# Patient Record
Sex: Female | Born: 1950 | Race: White | Hispanic: No | Marital: Married | Smoking: Never smoker
Health system: Southern US, Community
[De-identification: ages and names within clinical notes are randomized; demographics above are authoritative.]

## PROBLEM LIST (undated history)

## (undated) DIAGNOSIS — N393 Stress incontinence (female) (male): Secondary | ICD-10-CM

## (undated) DIAGNOSIS — Z806 Family history of leukemia: Secondary | ICD-10-CM

## (undated) DIAGNOSIS — I700 Atherosclerosis of aorta: Secondary | ICD-10-CM

## (undated) DIAGNOSIS — F439 Reaction to severe stress, unspecified: Secondary | ICD-10-CM

## (undated) DIAGNOSIS — K2270 Barrett's esophagus without dysplasia: Secondary | ICD-10-CM

## (undated) DIAGNOSIS — M1990 Unspecified osteoarthritis, unspecified site: Secondary | ICD-10-CM

## (undated) DIAGNOSIS — J309 Allergic rhinitis, unspecified: Secondary | ICD-10-CM

## (undated) DIAGNOSIS — Z803 Family history of malignant neoplasm of breast: Secondary | ICD-10-CM

## (undated) DIAGNOSIS — C801 Malignant (primary) neoplasm, unspecified: Secondary | ICD-10-CM

## (undated) DIAGNOSIS — M8580 Other specified disorders of bone density and structure, unspecified site: Secondary | ICD-10-CM

## (undated) DIAGNOSIS — Z860100 Personal history of colon polyps, unspecified: Secondary | ICD-10-CM

## (undated) DIAGNOSIS — Z8041 Family history of malignant neoplasm of ovary: Secondary | ICD-10-CM

## (undated) DIAGNOSIS — Z87898 Personal history of other specified conditions: Secondary | ICD-10-CM

## (undated) DIAGNOSIS — K219 Gastro-esophageal reflux disease without esophagitis: Secondary | ICD-10-CM

## (undated) DIAGNOSIS — E669 Obesity, unspecified: Secondary | ICD-10-CM

## (undated) DIAGNOSIS — N816 Rectocele: Secondary | ICD-10-CM

## (undated) DIAGNOSIS — Z8042 Family history of malignant neoplasm of prostate: Secondary | ICD-10-CM

## (undated) DIAGNOSIS — L900 Lichen sclerosus et atrophicus: Secondary | ICD-10-CM

## (undated) DIAGNOSIS — E559 Vitamin D deficiency, unspecified: Secondary | ICD-10-CM

## (undated) DIAGNOSIS — E785 Hyperlipidemia, unspecified: Secondary | ICD-10-CM

## (undated) DIAGNOSIS — N8110 Cystocele, unspecified: Secondary | ICD-10-CM

## (undated) DIAGNOSIS — Z801 Family history of malignant neoplasm of trachea, bronchus and lung: Secondary | ICD-10-CM

## (undated) DIAGNOSIS — M21619 Bunion of unspecified foot: Secondary | ICD-10-CM

## (undated) HISTORY — DX: Family history of malignant neoplasm of breast: Z80.3

## (undated) HISTORY — DX: Personal history of other specified conditions: Z87.898

## (undated) HISTORY — DX: Reaction to severe stress, unspecified: F43.9

## (undated) HISTORY — DX: Cystocele, unspecified: N81.10

## (undated) HISTORY — DX: Gastro-esophageal reflux disease without esophagitis: K21.9

## (undated) HISTORY — DX: Family history of malignant neoplasm of trachea, bronchus and lung: Z80.1

## (undated) HISTORY — DX: Hyperlipidemia, unspecified: E78.5

## (undated) HISTORY — DX: Personal history of colonic polyps: Z86.010

## (undated) HISTORY — DX: Bunion of unspecified foot: M21.619

## (undated) HISTORY — DX: Rectocele: N81.6

## (undated) HISTORY — DX: Allergic rhinitis, unspecified: J30.9

## (undated) HISTORY — PX: LUMBAR DISC SURGERY: SHX700

## (undated) HISTORY — DX: Other specified disorders of bone density and structure, unspecified site: M85.80

## (undated) HISTORY — DX: Family history of malignant neoplasm of ovary: Z80.41

## (undated) HISTORY — DX: Lichen sclerosus et atrophicus: L90.0

## (undated) HISTORY — DX: Vitamin D deficiency, unspecified: E55.9

## (undated) HISTORY — PX: TUBAL LIGATION: SHX77

## (undated) HISTORY — DX: Stress incontinence (female) (male): N39.3

## (undated) HISTORY — DX: Barrett's esophagus without dysplasia: K22.70

## (undated) HISTORY — DX: Family history of malignant neoplasm of prostate: Z80.42

## (undated) HISTORY — DX: Atherosclerosis of aorta: I70.0

## (undated) HISTORY — DX: Personal history of colon polyps, unspecified: Z86.0100

## (undated) HISTORY — DX: Obesity, unspecified: E66.9

## (undated) HISTORY — PX: BREAST LUMPECTOMY: SHX2

## (undated) HISTORY — DX: Family history of leukemia: Z80.6

---

## 1998-08-30 ENCOUNTER — Emergency Department (HOSPITAL_COMMUNITY): Admission: EM | Admit: 1998-08-30 | Discharge: 1998-08-30 | Payer: Self-pay | Admitting: Emergency Medicine

## 1998-08-31 ENCOUNTER — Encounter: Payer: Self-pay | Admitting: Emergency Medicine

## 2000-09-23 ENCOUNTER — Other Ambulatory Visit: Admission: RE | Admit: 2000-09-23 | Discharge: 2000-09-23 | Payer: Self-pay | Admitting: Obstetrics and Gynecology

## 2000-12-24 ENCOUNTER — Other Ambulatory Visit: Admission: RE | Admit: 2000-12-24 | Discharge: 2000-12-24 | Payer: Self-pay | Admitting: Obstetrics and Gynecology

## 2000-12-24 ENCOUNTER — Encounter (INDEPENDENT_AMBULATORY_CARE_PROVIDER_SITE_OTHER): Payer: Self-pay

## 2007-05-06 ENCOUNTER — Other Ambulatory Visit: Admission: RE | Admit: 2007-05-06 | Discharge: 2007-05-06 | Payer: Self-pay | Admitting: Family Medicine

## 2008-05-11 ENCOUNTER — Other Ambulatory Visit: Admission: RE | Admit: 2008-05-11 | Discharge: 2008-05-11 | Payer: Self-pay | Admitting: Family Medicine

## 2009-06-27 ENCOUNTER — Other Ambulatory Visit: Admission: RE | Admit: 2009-06-27 | Discharge: 2009-06-27 | Payer: Self-pay | Admitting: Family Medicine

## 2010-07-10 ENCOUNTER — Other Ambulatory Visit: Admission: RE | Admit: 2010-07-10 | Discharge: 2010-07-10 | Payer: Self-pay | Admitting: Family Medicine

## 2010-10-29 DIAGNOSIS — Z87898 Personal history of other specified conditions: Secondary | ICD-10-CM

## 2010-10-29 HISTORY — DX: Personal history of other specified conditions: Z87.898

## 2011-07-06 ENCOUNTER — Other Ambulatory Visit: Payer: Self-pay | Admitting: Family Medicine

## 2011-07-06 ENCOUNTER — Ambulatory Visit
Admission: RE | Admit: 2011-07-06 | Discharge: 2011-07-06 | Disposition: A | Discharge status: Home / Self Care | Payer: BC Managed Care – PPO | Source: Ambulatory Visit | Attending: Family Medicine | Admitting: Family Medicine

## 2011-07-06 DIAGNOSIS — M549 Dorsalgia, unspecified: Secondary | ICD-10-CM

## 2011-07-30 ENCOUNTER — Emergency Department (HOSPITAL_COMMUNITY)
Admission: EM | Admit: 2011-07-30 | Discharge: 2011-07-30 | Disposition: A | Discharge status: Home / Self Care | Payer: BC Managed Care – PPO | Attending: Emergency Medicine | Admitting: Emergency Medicine

## 2011-07-30 DIAGNOSIS — K219 Gastro-esophageal reflux disease without esophagitis: Secondary | ICD-10-CM | POA: Insufficient documentation

## 2011-07-30 DIAGNOSIS — R42 Dizziness and giddiness: Secondary | ICD-10-CM | POA: Insufficient documentation

## 2011-07-30 DIAGNOSIS — Z79899 Other long term (current) drug therapy: Secondary | ICD-10-CM | POA: Insufficient documentation

## 2011-07-30 DIAGNOSIS — R040 Epistaxis: Secondary | ICD-10-CM | POA: Insufficient documentation

## 2011-07-30 DIAGNOSIS — D649 Anemia, unspecified: Secondary | ICD-10-CM | POA: Insufficient documentation

## 2011-07-30 DIAGNOSIS — R110 Nausea: Secondary | ICD-10-CM | POA: Insufficient documentation

## 2011-07-30 LAB — CBC
HCT: 32.5 % — ABNORMAL LOW (ref 36.0–46.0)
Hemoglobin: 11.3 g/dL — ABNORMAL LOW (ref 12.0–15.0)
MCH: 30.9 pg (ref 26.0–34.0)
MCHC: 34.8 g/dL (ref 30.0–36.0)
MCV: 88.8 fL (ref 78.0–100.0)
Platelets: 339 10*3/uL (ref 150–400)
RBC: 3.66 MIL/uL — ABNORMAL LOW (ref 3.87–5.11)
RDW: 13 % (ref 11.5–15.5)
WBC: 7.3 10*3/uL (ref 4.0–10.5)

## 2013-01-27 HISTORY — PX: COLONOSCOPY: SHX174

## 2013-10-12 ENCOUNTER — Encounter (HOSPITAL_COMMUNITY): Payer: Self-pay | Admitting: Emergency Medicine

## 2013-10-12 ENCOUNTER — Emergency Department (HOSPITAL_COMMUNITY)
Admission: EM | Admit: 2013-10-12 | Discharge: 2013-10-12 | Disposition: A | Discharge status: Home / Self Care | Payer: BC Managed Care – PPO | Source: Home / Self Care | Attending: Family Medicine | Admitting: Family Medicine

## 2013-10-12 DIAGNOSIS — J069 Acute upper respiratory infection, unspecified: Secondary | ICD-10-CM

## 2013-10-12 DIAGNOSIS — R0982 Postnasal drip: Secondary | ICD-10-CM

## 2013-10-12 MED ORDER — PROMETHAZINE-CODEINE 6.25-10 MG/5ML PO SYRP: 5.0000 mL | ORAL_SOLUTION | Freq: Four times a day (QID) | ORAL | Status: DC | PRN

## 2013-10-12 MED ORDER — IPRATROPIUM BROMIDE 0.06 % NA SOLN: 2.0000 | Freq: Four times a day (QID) | NASAL | Status: DC

## 2013-10-12 MED ORDER — BENZONATATE 100 MG PO CAPS: 100.0000 mg | ORAL_CAPSULE | Freq: Three times a day (TID) | ORAL | Status: DC

## 2013-10-12 MED ORDER — PREDNISONE 10 MG PO TABS: 20.0000 mg | ORAL_TABLET | Freq: Every day | ORAL | Status: DC

## 2013-10-12 NOTE — ED Provider Notes (Signed)
Nichole Ritter is a 62 y.o. female who presents to Urgent Care today for sinus congestion cough and runny nose. This is been present for about one week. She was seen in urgent care [place] given third-generation cephalosporin and noncoated containing cough medication. She is now much better. She denies any shortness of breath nausea vomiting or diarrhea. She feels well otherwise. 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    History reviewed. No pertinent past medical history. History  Substance Use Topics  . Smoking status: Never Smoker   . Smokeless tobacco: Not on file  . Alcohol Use: No   ROS as above Medications reviewed. No current facility-administered medications for this encounter.   Current Outpatient Prescriptions  Medication Sig Dispense Refill  . pantoprazole (PROTONIX) 40 MG tablet Take 40 mg by mouth 2 (two) times daily.      . benzonatate (TESSALON) 100 MG capsule Take 1 capsule (100 mg total) by mouth every 8 (eight) hours.  21 capsule  0  . ipratropium (ATROVENT) 0.06 % nasal spray Place 2 sprays into both nostrils 4 (four) times daily.  15 mL  1  . predniSONE (DELTASONE) 10 MG tablet Take 2 tablets (20 mg total) by mouth daily.  10 tablet  0  . promethazine-codeine (PHENERGAN WITH CODEINE) 6.25-10 MG/5ML syrup Take 5 mLs by mouth every 6 (six) hours as needed for cough.  120 mL  0    Exam:  BP 126/72  Pulse 75  Temp(Src) 97.8 F (36.6 C) (Oral)  Resp 16  SpO2 100% Gen: Well NAD HEENT: EOMI,  MMM, posterior pharynx with cobblestoning. Tympanic membranes are normal appearing bilaterally Lungs: Normal work of breathing. CTABL Heart: RRR no MRG Abd: NABS, Soft. NT, ND Exts: Non edematous BL  LE, warm and well perfused.   Assessment and Plan: 62 y.o. female with brow URI with cough. Plan for symptomatic management Atrovent nasal spray, codeine containing cough medication, Tessalon Perles, and low-dose short duration prednisone. Call or go to the emergency  room if you get worse, have trouble breathing, have chest pains, or palpitations.       Rodolph Bong, MD 10/12/13 [phone]

## 2013-10-12 NOTE — ED Notes (Signed)
Facial pain and cough; recent treatment for bronchitis (never completely resolved)

## 2013-11-10 ENCOUNTER — Other Ambulatory Visit: Payer: Self-pay | Admitting: Family Medicine

## 2013-11-10 ENCOUNTER — Other Ambulatory Visit (HOSPITAL_COMMUNITY)
Admission: RE | Admit: 2013-11-10 | Discharge: 2013-11-10 | Disposition: A | Discharge status: Home / Self Care | Payer: BC Managed Care – PPO | Source: Ambulatory Visit | Attending: Family Medicine | Admitting: Family Medicine

## 2013-11-10 DIAGNOSIS — Z0000 Encounter for general adult medical examination without abnormal findings: Secondary | ICD-10-CM | POA: Insufficient documentation

## 2014-10-05 ENCOUNTER — Other Ambulatory Visit: Payer: Self-pay | Admitting: Family Medicine

## 2014-10-05 ENCOUNTER — Ambulatory Visit
Admission: RE | Admit: 2014-10-05 | Discharge: 2014-10-05 | Disposition: A | Discharge status: Home / Self Care | Payer: BC Managed Care – PPO | Source: Ambulatory Visit | Attending: Family Medicine | Admitting: Family Medicine

## 2014-10-05 DIAGNOSIS — M546 Pain in thoracic spine: Secondary | ICD-10-CM

## 2014-12-07 ENCOUNTER — Other Ambulatory Visit: Payer: Self-pay | Admitting: Gastroenterology

## 2017-06-04 ENCOUNTER — Other Ambulatory Visit: Payer: Self-pay | Admitting: Family Medicine

## 2017-06-04 DIAGNOSIS — R221 Localized swelling, mass and lump, neck: Secondary | ICD-10-CM

## 2017-06-10 ENCOUNTER — Ambulatory Visit
Admission: RE | Admit: 2017-06-10 | Discharge: 2017-06-10 | Disposition: A | Discharge status: Home / Self Care | Payer: Medicare Other | Source: Ambulatory Visit | Attending: Family Medicine | Admitting: Family Medicine

## 2017-06-10 DIAGNOSIS — R221 Localized swelling, mass and lump, neck: Secondary | ICD-10-CM

## 2017-06-10 MED ORDER — IOPAMIDOL (ISOVUE-300) INJECTION 61%
75.0000 mL | Freq: Once | INTRAVENOUS | Status: AC | PRN
  Administered 2017-06-10: 75 mL via INTRAVENOUS

## 2017-08-19 ENCOUNTER — Other Ambulatory Visit (HOSPITAL_COMMUNITY)
Admission: RE | Admit: 2017-08-19 | Discharge: 2017-08-19 | Disposition: A | Discharge status: Home / Self Care | Payer: Medicare Other | Source: Ambulatory Visit | Attending: Family Medicine | Admitting: Family Medicine

## 2017-08-19 ENCOUNTER — Other Ambulatory Visit: Payer: Self-pay | Admitting: Family Medicine

## 2017-08-19 DIAGNOSIS — Z124 Encounter for screening for malignant neoplasm of cervix: Secondary | ICD-10-CM | POA: Diagnosis present

## 2017-08-20 LAB — CYTOLOGY - PAP: DIAGNOSIS: NEGATIVE

## 2018-02-26 HISTORY — PX: CATARACT EXTRACTION: SUR2

## 2018-03-23 ENCOUNTER — Encounter (HOSPITAL_COMMUNITY): Payer: Self-pay | Admitting: Emergency Medicine

## 2018-03-23 ENCOUNTER — Other Ambulatory Visit: Payer: Self-pay

## 2018-03-23 ENCOUNTER — Ambulatory Visit (HOSPITAL_COMMUNITY)
Admission: EM | Admit: 2018-03-23 | Discharge: 2018-03-23 | Disposition: A | Discharge status: Home / Self Care | Payer: Medicare Other

## 2018-03-23 DIAGNOSIS — M545 Low back pain, unspecified: Secondary | ICD-10-CM

## 2018-03-23 DIAGNOSIS — G8929 Other chronic pain: Secondary | ICD-10-CM

## 2018-03-23 LAB — POCT URINALYSIS DIP (DEVICE)
BILIRUBIN URINE: NEGATIVE
Glucose, UA: NEGATIVE mg/dL
HGB URINE DIPSTICK: NEGATIVE
Ketones, ur: NEGATIVE mg/dL
Leukocytes, UA: NEGATIVE
Nitrite: NEGATIVE
Protein, ur: NEGATIVE mg/dL
Urobilinogen, UA: 0.2 mg/dL (ref 0.0–1.0)
pH: 6 (ref 5.0–8.0)

## 2018-03-23 MED ORDER — CYCLOBENZAPRINE HCL 10 MG PO TABS: 5.0000 mg | ORAL_TABLET | Freq: Three times a day (TID) | ORAL | 0 refills | Status: DC | PRN

## 2018-03-23 MED ORDER — TRAMADOL HCL 50 MG PO TABS: 50.0000 mg | ORAL_TABLET | Freq: Four times a day (QID) | ORAL | 0 refills | Status: DC | PRN

## 2018-03-23 NOTE — Discharge Instructions (Signed)
Urine is perfect today.  This is back pain and only back pain causing your symptoms.

## 2018-03-23 NOTE — ED Provider Notes (Signed)
03/23/2018 2:18 PM   DOB: 06-Apr-1951 / MRN: [id number]  SUBJECTIVE:  Nichole Ritter is a 67 y.o. female presenting for right-sided low back pain that started about 4 days ago and worsening.  Patient cannot take NSAIDs secondary to "a bad esophagus."  She denies weakness and numbness in the legs.  She had back surgery in the past.  She denies rash.  She worries about the possibility of a UTI because she has been peeing more frequently.  She is allergic to caine-1 [lidocaine].   She  has no past medical history on file.    She  reports that she has never smoked. She does not have any smokeless tobacco history on file. She reports that she does not drink alcohol or use drugs. She  has no sexual activity history on file. The patient  has a past surgical history that includes Lumbar disc surgery; Breast lumpectomy; and Tubal ligation.  Her family history includes Cancer in her mother; Diabetes in her mother; Hypertension in her mother; Stroke in her mother.  Review of Systems  Genitourinary: Positive for frequency. Negative for dysuria, flank pain, hematuria and urgency.  Musculoskeletal: Positive for back pain and myalgias. Negative for falls, joint pain and neck pain.  Neurological: Negative for weakness and headaches.    OBJECTIVE:  BP (!) 155/78 (BP Location: Left Arm) Comment: reported BP to Nurse Kenton Kingfisher  Pulse 60   Temp 98.2 F (36.8 C) (Oral)   Resp 16   SpO2 97%   Wt Readings from Last 3 Encounters:  No data found for Wt   Temp Readings from Last 3 Encounters:  03/23/18 98.2 F (36.8 C) (Oral)  10/12/13 97.8 F (36.6 C) (Oral)   BP Readings from Last 3 Encounters:  03/23/18 (!) 155/78  10/12/13 126/72   Pulse Readings from Last 3 Encounters:  03/23/18 60  10/12/13 75     Physical Exam  Constitutional: She is oriented to person, place, and time. She appears well-nourished. No distress.  Eyes: Pupils are equal, round, and reactive to light. EOM are normal.   Cardiovascular: Normal rate, regular rhythm, S1 normal, S2 normal, normal heart sounds and intact distal pulses. Exam reveals no gallop, no friction rub and no decreased pulses.  No murmur heard. Pulmonary/Chest: Effort normal. No stridor. No respiratory distress. She has no wheezes. She has no rales.  Abdominal: She exhibits no distension.  Musculoskeletal: She exhibits no edema.  Neurological: She is alert and oriented to person, place, and time. No cranial nerve deficit. Gait normal.  Skin: Skin is dry. She is not diaphoretic.  Psychiatric: She has a normal mood and affect.  Vitals reviewed.   Results for orders placed or performed during the hospital encounter of 03/23/18 (from the past 72 hour(s))  POCT urinalysis dip (device)     Status: None   Collection Time: 03/23/18  2:08 PM  Result Value Ref Range   Glucose, UA NEGATIVE NEGATIVE mg/dL   Bilirubin Urine NEGATIVE NEGATIVE   Ketones, ur NEGATIVE NEGATIVE mg/dL   Specific Gravity, Urine <=1.005 1.005 - 1.030   Hgb urine dipstick NEGATIVE NEGATIVE   pH 6.0 5.0 - 8.0   Protein, ur NEGATIVE NEGATIVE mg/dL   Urobilinogen, UA 0.2 0.0 - 1.0 mg/dL   Nitrite NEGATIVE NEGATIVE   Leukocytes, UA NEGATIVE NEGATIVE    Comment: Biochemical Testing Only. Please order routine urinalysis from main lab if confirmatory testing is needed.    No results found.  ASSESSMENT AND PLAN:  Acute left-sided low back pain without sciatica: She has a history of esophagitis.  Starting tramadol along with Flexeril.  Advised that she follow-up with her primary care provider as this is certainly acute on chronic.  Chronic low back pain, unspecified back pain laterality, with sciatica presence unspecified    Discharge Instructions     Urine is perfect today.  This is back pain and only back pain causing your symptoms.        The patient is advised to call or return to clinic if she does not see an improvement in symptoms, or to seek the care  of the closest emergency department if she worsens with the above plan.   Philis Fendt, MHS, PA-C 03/23/2018 2:18 PM   Tereasa Coop, PA-C 03/23/18 1419

## 2018-03-23 NOTE — ED Triage Notes (Signed)
History of back surgery.  Had a back injury following this surgery.  This past week back pain has been significantly bad.  Patient is having cataract surgery this week.

## 2018-08-21 ENCOUNTER — Telehealth: Payer: Self-pay

## 2018-08-21 NOTE — Telephone Encounter (Signed)
SENT REFERRAL TO SCHEDULING AND FILED NOTES 

## 2018-09-11 ENCOUNTER — Encounter: Payer: Self-pay | Admitting: Cardiology

## 2018-10-01 ENCOUNTER — Encounter: Payer: Self-pay | Admitting: *Deleted

## 2018-10-01 ENCOUNTER — Ambulatory Visit: Payer: Medicare Other | Admitting: Cardiology

## 2018-10-01 ENCOUNTER — Encounter (INDEPENDENT_AMBULATORY_CARE_PROVIDER_SITE_OTHER): Payer: Self-pay

## 2018-10-01 ENCOUNTER — Encounter: Payer: Self-pay | Admitting: Cardiology

## 2018-10-01 VITALS — BP 140/80 | HR 65 | Ht 63.5 in | Wt 181.8 lb

## 2018-10-01 DIAGNOSIS — R0789 Other chest pain: Secondary | ICD-10-CM | POA: Diagnosis not present

## 2018-10-01 DIAGNOSIS — Z8249 Family history of ischemic heart disease and other diseases of the circulatory system: Secondary | ICD-10-CM | POA: Diagnosis not present

## 2018-10-01 DIAGNOSIS — I700 Atherosclerosis of aorta: Secondary | ICD-10-CM | POA: Diagnosis not present

## 2018-10-01 DIAGNOSIS — Z79899 Other long term (current) drug therapy: Secondary | ICD-10-CM

## 2018-10-01 MED ORDER — ROSUVASTATIN CALCIUM 5 MG PO TABS: 5.0000 mg | ORAL_TABLET | Freq: Every day | ORAL | 3 refills | Status: DC

## 2018-10-01 NOTE — Patient Instructions (Signed)
Medication Instructions:  Please start Crestor 5 mg daily. Continue all other medications as listed.  If you need a refill on your cardiac medications before your next appointment, please call your pharmacy.   Lab work: Please have fasting lab work in 2 months after starting Crestor (Lipid/ALT) If you have labs (blood work) drawn today and your tests are completely normal, you will receive your results only by: Marland Kitchen MyChart Message (if you have MyChart) OR . A paper copy in the mail If you have any lab test that is abnormal or we need to change your treatment, we will call you to review the results.  Testing/Procedures: Your physician has requested that you have a myoview. For further information please visit [email or web address]. Please follow instruction sheet, as given.  Follow-Up: At [facility], you and your health needs are our priority.  As part of our continuing mission to provide you with exceptional heart care, we have created designated Provider Care Teams.  These Care Teams include your primary Cardiologist (physician) and Advanced Practice Providers (APPs -  Physician Assistants and Nurse Practitioners) who all work together to provide you with the care you need, when you need it. You will need a follow up appointment in 3 months.  Please call our office 2 months in advance to schedule this appointment.  You may see Candee Furbish, MD or one of the following Advanced Practice Providers on your designated Care Team:   Truitt Merle, NP Cecilie Kicks, NP . Kathyrn Drown, NP  Thank you for choosing [facility]!!

## 2018-10-01 NOTE — Progress Notes (Signed)
Cardiology Office Note:    Date:  10/01/2018   ID:  Nichole Ritter, DOB Aug 01, 1951, MRN [id number]  PCP:  [name], MD  Cardiologist:  Candee Furbish, MD  Electrophysiologist:  None   Referring MD: [name], MD     History of Present Illness:    Nichole Ritter is a 67 y.o. female here for evaluation of chest pain at the request of Dr. [name].  In review of her clinic note stress has been quite high over the past few years.  Her 62 year old niece lives with her for the past year and a half.  She has been arrested, psychiatric unit, schizophrenia.  She is worried about her heart.  2 of her brothers had heart disease in their 9s. One brother in perfect health died MI, other CAD with stents.  She has been having chest discomfort that she thought was reflux.  Sometimes burning in her chest goes up her neck.  Sometimes she has associated nausea.  This can be randomly and not always associated with exertion.  For instance, she is able to walk 15 minutes without discomfort.  No unreasonable shortness of breath or diaphoresis. Feels heavy at night, palps.   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  She is heard several terrible things about them.  Her brother takes atorvastatin and is doing okay with this.  She refuses to take that medication.  No DM. No tob.   No syncope.   She has been diagnosed in the past with esophagitis duodenitis from an EGD in 2009 from Dr. Wynell Balloon from at [facility].  Dr. Paulita Fujita sees her here.  She has had epistaxis 2012.  She takes PPI.  Past Medical History:  Diagnosis Date  . Allergic rhinitis   . Aortic atherosclerosis ([place])   . Barrett esophagus   . Bunion   . Female cystocele   . GERD (gastroesophageal reflux disease)   . History of epistaxis 2012  . Hyperlipidemia    UNSPECIFIED  . Lichen sclerosus   . Obesity   . Osteopenia   . Personal history of colonic polyps   . Rectocele   . Situational  stress   . Stress incontinence   . Vitamin D deficiency     Past Surgical History:  Procedure Laterality Date  . BREAST LUMPECTOMY    . CATARACT EXTRACTION Bilateral 02/2018   03/2018  . COLONOSCOPY  01/2013  . LUMBAR DISC SURGERY    . TUBAL LIGATION      Current Medications: No outpatient medications have been marked as taking for the 10/01/18 encounter (Office Visit) with Jerline Pain, MD.     Allergies:   Caine-1 [lidocaine]   Social History   Socioeconomic History  . Marital status: Married    Spouse name: Not on file  . Number of children: Not on file  . Years of education: Not on file  . Highest education level: Not on file  Occupational History  . Not on file  Social Needs  . Financial resource strain: Not on file  . Food insecurity:    Worry: Not on file    Inability: Not on file  . Transportation needs:    Medical: Not on file    Non-medical: Not on file  Tobacco Use  . Smoking status: Never Smoker  . Smokeless tobacco: Never Used  Substance and Sexual Activity  . Alcohol use: No  . Drug use: Never  .  Sexual activity: Not on file  Lifestyle  . Physical activity:    Days per week: Not on file    Minutes per session: Not on file  . Stress: Not on file  Relationships  . Social connections:    Talks on phone: Not on file    Gets together: Not on file    Attends religious service: Not on file    Active member of club or organization: Not on file    Attends meetings of clubs or organizations: Not on file    Relationship status: Not on file  Other Topics Concern  . Not on file  Social History Narrative  . Not on file     Family History: The patient's family history includes Cancer in her mother; Diabetes in her mother; Hypertension in her mother; Stroke in her mother.  ROS:   Please see the history of present illness.    No fevers chills nausea vomiting syncope.  All other systems reviewed and are negative.  EKGs/Labs/Other Studies Reviewed:      The following studies were reviewed today: Prior office notes, lab work, EKG  EKG:  EKG from 08/20/2018 performed by Dr. [name] shows heart rate of 55 bpm with poor R wave progression otherwise unremarkable.  Personally reviewed and interpreted  Recent Labs: No results found for requested labs within last 8760 hours.  Recent Lipid Panel No results found for: CHOL, TRIG, HDL, CHOLHDL, VLDL, LDLCALC, LDLDIRECT   Total cholesterol 235 triglycerides 101 HDL 55 LDL 159 hemoglobin 15.1 320 creatinine 0.72, ALT 22, TSH 2.89  Physical Exam:    VS:  BP 140/80   Pulse 65   Ht 5' 3.5" (1.613 m)   Wt 181 lb 12.8 oz (82.5 kg)   SpO2 95%   BMI 31.70 kg/m     Wt Readings from Last 3 Encounters:  10/01/18 181 lb 12.8 oz (82.5 kg)     GEN:  Well nourished, well developed in no acute distress HEENT: Normal NECK: No JVD; No carotid bruits LYMPHATICS: No lymphadenopathy CARDIAC: RRR, no murmurs, rubs, gallops RESPIRATORY:  Clear to auscultation without rales, wheezing or rhonchi  ABDOMEN: Soft, non-tender, non-distended MUSCULOSKELETAL:  No edema; No deformity  SKIN: Warm and dry NEUROLOGIC:  Alert and oriented x 3 PSYCHIATRIC:  Normal affect   ASSESSMENT:    1. Chest tightness   2. Family history of arteriosclerotic cardiovascular disease   3. Encounter for long-term current use of medication   4. Aortic atherosclerosis ([place])    PLAN:    In order of problems listed above:  Atypical chest pain - We will go ahead and check a nuclear stress test to ensure that she does not have any high risk ischemia.  Certainly her symptoms could be GI related or GERD but with her brothers history of coronary artery disease and 1 of sudden death this makes sense to pursue.  Aortic atherosclerosis - I personally reviewed and showed her the pictures from her CT scan of her neck.  The samples of her aortic arch demonstrate fairly significant aortic calcification/atherosclerosis.  Given these  findings along with her elevated LDL of 159, I strongly recommend that she takes statin medication.  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  She  is willing to try a low-dose of medicine other than atorvastatin.  I did relay to her that if her brother is tolerating atorvastatin well, sometimes genetically she would be able to tolerate it well as well.  She does not wish to use this medicine.  She was however willing to try low-dose Crestor 5 mg once a day.  She says "I will give it a try for 2 to 3 months ".  I expressed to her that with her elevated cholesterol, family history, aortic atherosclerotic process that taking a statin will help her minimize her future risk of stroke and heart attack.  She understands this.  I also conveyed to her that we do have a lipid clinic here if she is having any difficulties with the statin medications and she may be a candidate for an alternative.  In 2 months recheck lipid panel and ALT.  We will see her back in 3 months to see how she is progressing. Of course, I will let her know the results of her stress test.   Medication Adjustments/Labs and Tests Ordered: Current medicines are reviewed at length with the patient today.  Concerns regarding medicines are outlined above.  Orders Placed This Encounter  Procedures  . ALT  . Lipid panel  . MYOCARDIAL PERFUSION IMAGING   Meds ordered this encounter  Medications  . rosuvastatin (CRESTOR) 5 MG tablet    Sig: Take 1 tablet (5 mg total) by mouth daily.    Dispense:  90 tablet    Refill:  3    Patient Instructions  Medication Instructions:  Please start Crestor 5 mg daily. Continue all other medications as listed.  If you need a refill on your cardiac medications before your next  appointment, please call your pharmacy.   Lab work: Please have fasting lab work in 2 months after starting Crestor (Lipid/ALT) If you have labs (blood work) drawn today and your tests are completely normal, you will receive your results only by: Marland Kitchen MyChart Message (if you have MyChart) OR . A paper copy in the mail If you have any lab test that is abnormal or we need to change your treatment, we will call you to review the results.  Testing/Procedures: Your physician has requested that you have a myoview. For further information please visit [email or web address]. Please follow instruction sheet, as given.  Follow-Up: At [facility] ([place] you and your health needs are our priority.  As part of our continuing mission to provide you with exceptional heart care, we have created designated Provider Care Teams.  These Care Teams include your primary Cardiologist (physician) and Advanced Practice Providers (APPs -  Physician Assistants and Nurse Practitioners) who all work together to provide you with the care you need, when you need it. You will need a follow up appointment in 3 months.  Please call our office 2 months in advance to schedule this appointment.  You may see Candee Furbish, MD or one of the following Advanced Practice Providers on your designated Care Team:   Truitt Merle, NP Cecilie Kicks, NP . Kathyrn Drown, NP  Thank you for choosing [facility]!!          Signed, Candee Furbish, MD  10/01/2018 11:25 AM    [place]

## 2018-10-02 ENCOUNTER — Telehealth (HOSPITAL_COMMUNITY): Payer: Self-pay

## 2018-10-02 ENCOUNTER — Telehealth (HOSPITAL_COMMUNITY): Payer: Self-pay | Admitting: *Deleted

## 2018-10-02 NOTE — Telephone Encounter (Signed)
Left message on voicemail in reference to upcoming appointment scheduled for 11/26/17. Phone number given for a call back so details instructions can be given.  Nichole Ritter Jacqueline   

## 2018-10-02 NOTE — Telephone Encounter (Signed)
Patient given detailed instructions per Myocardial Perfusion Study Information Sheet for the test on 10/06/18 at 0800. Patient notified to arrive 15 minutes early and that it is imperative to arrive on time for appointment to keep from having the test rescheduled.  If you need to cancel or reschedule your appointment, please call the office within 24 hours of your appointment. . Patient verbalized understanding. TMY

## 2018-10-06 ENCOUNTER — Ambulatory Visit (HOSPITAL_COMMUNITY): Payer: Medicare Other | Attending: Internal Medicine

## 2018-10-06 DIAGNOSIS — Z8249 Family history of ischemic heart disease and other diseases of the circulatory system: Secondary | ICD-10-CM | POA: Insufficient documentation

## 2018-10-06 DIAGNOSIS — R0789 Other chest pain: Secondary | ICD-10-CM | POA: Insufficient documentation

## 2018-10-06 LAB — MYOCARDIAL PERFUSION IMAGING
CHL CUP MPHR: 152 {beats}/min
CHL CUP NUCLEAR SDS: 0
Estimated workload: 7 METS
Exercise duration (min): 6 min
LV sys vol: 23 mL
LVDIAVOL: 62 mL (ref 46–106)
Peak HR: 136 {beats}/min
Percent HR: 89 %
RPE: 18
Rest HR: 48 {beats}/min
SRS: 0
SSS: 0
TID: 0.89

## 2018-10-06 MED ORDER — TECHNETIUM TC 99M TETROFOSMIN IV KIT
10.9000 | PACK | Freq: Once | INTRAVENOUS | Status: AC | PRN
  Administered 2018-10-06: 10.9 via INTRAVENOUS
  Filled 2018-10-06: qty 11

## 2018-10-06 MED ORDER — TECHNETIUM TC 99M TETROFOSMIN IV KIT
32.3000 | PACK | Freq: Once | INTRAVENOUS | Status: AC | PRN
  Administered 2018-10-06: 32.3 via INTRAVENOUS
  Filled 2018-10-06: qty 33

## 2018-10-06 NOTE — Progress Notes (Signed)
Pt was scheduled for 12/9 at 8 AM.

## 2018-12-02 ENCOUNTER — Other Ambulatory Visit: Payer: Medicare Other

## 2018-12-02 ENCOUNTER — Encounter (INDEPENDENT_AMBULATORY_CARE_PROVIDER_SITE_OTHER): Payer: Self-pay

## 2018-12-02 DIAGNOSIS — R0789 Other chest pain: Secondary | ICD-10-CM

## 2018-12-02 DIAGNOSIS — Z79899 Other long term (current) drug therapy: Secondary | ICD-10-CM

## 2018-12-02 LAB — LIPID PANEL
CHOLESTEROL TOTAL: 179 mg/dL (ref 100–199)
Chol/HDL Ratio: 3.9 ratio (ref 0.0–4.4)
HDL: 46 mg/dL (ref >39)
LDL CALC: 93 mg/dL (ref 0–99)
Triglycerides: 202 mg/dL — ABNORMAL HIGH (ref 0–149)
VLDL Cholesterol Cal: 40 mg/dL (ref 5–40)

## 2018-12-02 LAB — ALT: ALT: 22 IU/L (ref 0–32)

## 2019-01-06 ENCOUNTER — Encounter: Payer: Self-pay | Admitting: Cardiology

## 2019-01-06 ENCOUNTER — Encounter (INDEPENDENT_AMBULATORY_CARE_PROVIDER_SITE_OTHER): Payer: Self-pay

## 2019-01-06 ENCOUNTER — Ambulatory Visit: Payer: Medicare Other | Admitting: Cardiology

## 2019-01-06 VITALS — BP 144/100 | HR 65 | Ht 63.0 in | Wt 189.4 lb

## 2019-01-06 DIAGNOSIS — Z8249 Family history of ischemic heart disease and other diseases of the circulatory system: Secondary | ICD-10-CM | POA: Diagnosis not present

## 2019-01-06 DIAGNOSIS — R0789 Other chest pain: Secondary | ICD-10-CM

## 2019-01-06 DIAGNOSIS — E782 Mixed hyperlipidemia: Secondary | ICD-10-CM | POA: Diagnosis not present

## 2019-01-06 DIAGNOSIS — I700 Atherosclerosis of aorta: Secondary | ICD-10-CM | POA: Insufficient documentation

## 2019-01-06 NOTE — Progress Notes (Signed)
Cardiology Office Note:    Date:  01/06/2019   ID:  Nichole Ritter, DOB 02-11-1951, MRN [id number]  PCP:  [name], MD  Cardiologist:  Candee Furbish, MD  Electrophysiologist:  None   Referring MD: [name], MD     History of Present Illness:    Nichole Ritter is a 68 y.o. female here for follow-up of hyperlipidemia.  LDL 93, triglycerides 202, HDL 46, total cholesterol 179 on 12/02/2018 after starting Crestor 5 mg once a day.  Also taking fish oil.  Aortic atherosclerosis was previously seen on CT scan.  She is worried about her heart.  2 of her brothers had heart disease in their 38s. One brother in perfect health died MI, other CAD with stents.  She has been having chest discomfort that she thought was reflux.  Sometimes burning in her chest goes up her neck.  Sometimes she has associated nausea.  This can be randomly and not always associated with exertion.  For instance, she is able to walk 15 minutes without discomfort.  No unreasonable shortness of breath or diaphoresis. Feels heavy at night, palps.   A nuclear stress test was performed and was low risk with no ischemia.  Aortic atherosclerosis was noted on CT scan.  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  She is heard several terrible things about them.  Her brother takes atorvastatin and is doing okay with this.   No DM. No tob.   No syncope.   She has been diagnosed in the past with esophagitis duodenitis from an EGD in 2009 from Dr. Wynell Balloon from at [facility].  Dr. Paulita Fujita sees her here.  She has had epistaxis 2012.  She takes PPI. Past Medical History:  Diagnosis Date  . Allergic rhinitis   . Aortic atherosclerosis ([place])   . Barrett esophagus   . Bunion   . Female cystocele   . GERD (gastroesophageal reflux disease)   . History of epistaxis 2012  . Hyperlipidemia    UNSPECIFIED  . Lichen sclerosus   . Obesity   . Osteopenia   . Personal history of colonic  polyps   . Rectocele   . Situational stress   . Stress incontinence   . Vitamin D deficiency     Past Surgical History:  Procedure Laterality Date  . BREAST LUMPECTOMY    . CATARACT EXTRACTION Bilateral 02/2018   03/2018  . COLONOSCOPY  01/2013  . LUMBAR DISC SURGERY    . TUBAL LIGATION      Current Medications: Current Meds  Medication Sig  . B Complex-C (SUPER B COMPLEX PO) Take 1 tablet by mouth daily.   . betamethasone dipropionate (DIPROLENE) 0.05 % cream Apply 1 application topically 2 (two) times daily as needed.   . Calcium-Magnesium-Vitamin D (CALCIUM 1200+D3 PO) Take 1 tablet by mouth daily.   . Cholecalciferol (VITAMIN D3) 50 MCG (2000 UT) capsule Take 2,000 Units by mouth daily.  . cycloSPORINE (RESTASIS) 0.05 % ophthalmic emulsion 1 drop 2 (two) times daily.  . fluticasone (FLONASE) 50 MCG/ACT nasal spray Place into both nostrils daily.  . LUTEIN PO Take by mouth.  . Multiple Vitamins-Minerals (CENTRUM SILVER 50+WOMEN PO) Take 1 tablet by mouth daily.   . pantoprazole (PROTONIX) 40 MG tablet Take 40 mg by mouth daily.  . rosuvastatin (CRESTOR) 5 MG tablet Take 1 tablet (5 mg total) by mouth daily.     Allergies:   Caine-1 [lidocaine]; Esomeprazole magnesium;  Formaldehyde; and Nickel   Social History   Socioeconomic History  . Marital status: Married    Spouse name: Not on file  . Number of children: Not on file  . Years of education: Not on file  . Highest education level: Not on file  Occupational History  . Not on file  Social Needs  . Financial resource strain: Not on file  . Food insecurity:    Worry: Not on file    Inability: Not on file  . Transportation needs:    Medical: Not on file    Non-medical: Not on file  Tobacco Use  . Smoking status: Never Smoker  . Smokeless tobacco: Never Used  Substance and Sexual Activity  . Alcohol use: No  . Drug use: Never  . Sexual activity: Not on file  Lifestyle  . Physical activity:    Days per week:  Not on file    Minutes per session: Not on file  . Stress: Not on file  Relationships  . Social connections:    Talks on phone: Not on file    Gets together: Not on file    Attends religious service: Not on file    Active member of club or organization: Not on file    Attends meetings of clubs or organizations: Not on file    Relationship status: Not on file  Other Topics Concern  . Not on file  Social History Narrative  . Not on file     Family History: The patient's family history includes Cancer in her mother; Diabetes in her mother; Hypertension in her mother; Stroke in her mother.  ROS:   Please see the history of present illness.     All other systems reviewed and are negative.  EKGs/Labs/Other Studies Reviewed:    The following studies were reviewed today:  08/2018 - Total cholesterol 235 triglycerides 101 HDL 55 LDL 159 hemoglobin 15.1 320 creatinine 0.72, ALT 22, TSH 2.89  EKG:  08/20/2018 performed by Dr. [name] shows heart rate of 55 bpm with poor R wave progression otherwise unremarkable.  Personally reviewed and interpreted Recent Labs: 12/02/2018: ALT 22  Recent Lipid Panel    Component Value Date/Time   CHOL 179 12/02/2018 1006   TRIG 202 (H) 12/02/2018 1006   HDL 46 12/02/2018 1006   CHOLHDL 3.9 12/02/2018 1006   LDLCALC 93 12/02/2018 1006    Physical Exam:    VS:  BP (!) 144/100   Pulse 65   Ht 5\' 3"  (1.6 m)   Wt 189 lb 6.4 oz (85.9 kg)   SpO2 95%   BMI 33.55 kg/m     Wt Readings from Last 3 Encounters:  01/06/19 189 lb 6.4 oz (85.9 kg)  10/06/18 181 lb (82.1 kg)  10/01/18 181 lb 12.8 oz (82.5 kg)     GEN:  Well nourished, well developed in no acute distress HEENT: Normal NECK: No JVD; No carotid bruits LYMPHATICS: No lymphadenopathy CARDIAC: RRR, no murmurs, rubs, gallops RESPIRATORY:  Clear to auscultation without rales, wheezing or rhonchi  ABDOMEN: Soft, non-tender, non-distended MUSCULOSKELETAL:  No edema; No deformity    SKIN: Warm and dry NEUROLOGIC:  Alert and oriented x 3 PSYCHIATRIC:  Normal affect   ASSESSMENT:    1. Chest tightness   2. Family history of arteriosclerotic cardiovascular disease   3. Aortic atherosclerosis ([place])   4. Mixed hyperlipidemia    PLAN:    In order of problems listed above:  Prior atypical chest pain -  Nuclear stress test 10/06/2018 normal perfusion. LVEF 64% with normal wall motion. Fair exercise tolerance. This is a low risk study. Excellent  Aortic atherosclerosis -Prior LDL 159.  Now less than 100, 93 now on Crestor 5 mg.  No myalgias.  Tolerating well.  Strong family history.  Showed her pictures of her aortic atherosclerosis, hit home for her.  Continue to take for risk factor prevention.  Essential hypertension  - BP has been up. Mammogram worry.  She and Dr. Dema Severin continuing to monitor this.  I will be happy to see her back on as-needed basis.  For now, continue with treatment of her lipids with Crestor and monitoring her blood pressure and treating as necessary.  Medication Adjustments/Labs and Tests Ordered: Current medicines are reviewed at length with the patient today.  Concerns regarding medicines are outlined above.  No orders of the defined types were placed in this encounter.  No orders of the defined types were placed in this encounter.   Patient Instructions  Medication Instructions:  The current medical regimen is effective;  continue present plan and medications.  Follow-Up: Follow up as needed with Dr Marlou Porch.  Thank you for choosing [facility]!!         Signed, Candee Furbish, MD  01/06/2019 9:38 AM    [place]

## 2019-01-06 NOTE — Patient Instructions (Signed)
Medication Instructions:  The current medical regimen is effective;  continue present plan and medications.  Follow-Up: Follow up as needed with Dr Skains.  Thank you for choosing  [facility]!!     

## 2019-02-10 ENCOUNTER — Other Ambulatory Visit: Payer: Self-pay | Admitting: Radiology

## 2019-02-10 DIAGNOSIS — R922 Inconclusive mammogram: Secondary | ICD-10-CM

## 2019-03-20 ENCOUNTER — Other Ambulatory Visit: Payer: Medicare Other

## 2019-03-20 ENCOUNTER — Telehealth: Payer: Self-pay | Admitting: *Deleted

## 2019-03-20 DIAGNOSIS — Z20822 Contact with and (suspected) exposure to covid-19: Secondary | ICD-10-CM

## 2019-03-20 NOTE — Telephone Encounter (Signed)
Jessica calling from [place] to request COVID-19 testing by Dr. [name]. Contact number for the pt is [phone].  Pt called and scheduled for testing on 03/20/19 at 1 pm at the [facility] testing site. Pt advised to stay in car and to wear a mask. Pt verbalized understanding.

## 2019-03-23 LAB — NOVEL CORONAVIRUS, NAA: SARS-CoV-2, NAA: NOT DETECTED

## 2019-03-31 ENCOUNTER — Ambulatory Visit
Admission: RE | Admit: 2019-03-31 | Discharge: 2019-03-31 | Disposition: A | Discharge status: Home / Self Care | Payer: No Typology Code available for payment source | Source: Ambulatory Visit | Attending: Radiology | Admitting: Radiology

## 2019-03-31 ENCOUNTER — Other Ambulatory Visit: Payer: Self-pay

## 2019-03-31 DIAGNOSIS — R922 Inconclusive mammogram: Secondary | ICD-10-CM

## 2019-03-31 DIAGNOSIS — R9230 Dense breasts, unspecified: Secondary | ICD-10-CM

## 2019-03-31 MED ORDER — GADOBUTROL 1 MMOL/ML IV SOLN
8.0000 mL | Freq: Once | INTRAVENOUS | Status: AC | PRN
  Administered 2019-03-31: 11:00:00 8 mL via INTRAVENOUS

## 2019-04-20 ENCOUNTER — Other Ambulatory Visit: Payer: Self-pay | Admitting: Gastroenterology

## 2019-04-20 DIAGNOSIS — K219 Gastro-esophageal reflux disease without esophagitis: Secondary | ICD-10-CM

## 2019-04-20 DIAGNOSIS — R1310 Dysphagia, unspecified: Secondary | ICD-10-CM

## 2019-05-05 ENCOUNTER — Ambulatory Visit
Admission: RE | Admit: 2019-05-05 | Discharge: 2019-05-05 | Disposition: A | Discharge status: Home / Self Care | Payer: Medicare Other | Source: Ambulatory Visit | Attending: Gastroenterology | Admitting: Gastroenterology

## 2019-05-05 DIAGNOSIS — K219 Gastro-esophageal reflux disease without esophagitis: Secondary | ICD-10-CM

## 2019-05-05 DIAGNOSIS — R1310 Dysphagia, unspecified: Secondary | ICD-10-CM

## 2019-11-27 ENCOUNTER — Ambulatory Visit: Payer: Medicare Other

## 2019-12-03 ENCOUNTER — Ambulatory Visit: Payer: Medicare Other | Attending: Internal Medicine

## 2019-12-03 DIAGNOSIS — Z23 Encounter for immunization: Secondary | ICD-10-CM | POA: Insufficient documentation

## 2019-12-03 NOTE — Progress Notes (Signed)
   Covid-19 Vaccination Clinic  Name:  Apurva Garofalo    MRN: [id number] DOB: 1951-01-28  12/03/2019  Ms. Salada was observed post Covid-19 immunization for 15 minutes without incidence. She was provided with Vaccine Information Sheet and instruction to access the V-Safe system.   Ms. Thelusma was instructed to call 911 with any severe reactions post vaccine: Marland Kitchen Difficulty breathing  . Swelling of your face and throat  . A fast heartbeat  . A bad rash all over your body  . Dizziness and weakness

## 2019-12-14 ENCOUNTER — Ambulatory Visit: Payer: Medicare Other

## 2019-12-29 ENCOUNTER — Ambulatory Visit: Payer: Medicare Other | Attending: Internal Medicine

## 2019-12-29 DIAGNOSIS — Z23 Encounter for immunization: Secondary | ICD-10-CM | POA: Insufficient documentation

## 2019-12-29 NOTE — Progress Notes (Signed)
   Covid-19 Vaccination Clinic  Name:  Nichole Ritter    MRN: [id number] DOB: 10-10-1951  12/29/2019  [name] was observed post Covid-19 immunization for 15 minutes without incident. She was provided with Vaccine Information Sheet and instruction to access the V-Safe system.   [name] was instructed to call 911 with any severe reactions post vaccine: Marland Kitchen Difficulty breathing  . Swelling of face and throat  . A fast heartbeat  . A bad rash all over body  . Dizziness and weakness   Immunizations Administered    Name Date Dose VIS Date Route   Pfizer COVID-19 Vaccine 12/29/2019  8:53 AM 0.3 mL 10/09/2019 Intramuscular   Manufacturer: [place]   Lot: [id number]   [place]: [id number]

## 2020-03-07 ENCOUNTER — Ambulatory Visit
Admission: RE | Admit: 2020-03-07 | Discharge: 2020-03-07 | Disposition: A | Discharge status: Home / Self Care | Payer: Medicare Other | Source: Ambulatory Visit | Attending: Family Medicine | Admitting: Family Medicine

## 2020-03-07 ENCOUNTER — Other Ambulatory Visit: Payer: Self-pay | Admitting: Family Medicine

## 2020-03-07 DIAGNOSIS — M5136 Other intervertebral disc degeneration, lumbar region: Secondary | ICD-10-CM

## 2020-03-07 DIAGNOSIS — M549 Dorsalgia, unspecified: Secondary | ICD-10-CM

## 2020-10-25 ENCOUNTER — Telehealth: Payer: Medicare Other | Admitting: Physician Assistant

## 2020-10-25 DIAGNOSIS — Z20822 Contact with and (suspected) exposure to covid-19: Secondary | ICD-10-CM

## 2020-10-25 NOTE — Progress Notes (Signed)
E-Visit for Corona Virus Screening  Your current symptoms could be consistent with the coronavirus.  Many health care providers can now test patients at their office but not all are.  [facility] has multiple testing sites. For information on our [place] testing locations and hours go to [email or web address]  We are enrolling you in our [place] for [place] . Daily you will receive a questionnaire within the [facility] website. Our COVID 19 response team will be monitoring your responses daily.  Testing Information: The COVID-19 Community Testing sites are testing BY APPOINTMENT ONLY.  You can schedule online at [email or web address]  If you do not have access to a smart phone or computer you may call [phone] for an appointment.   Additional testing sites in the Community:  . For [facility] Testing sites in [facility]  [email or web address]  . For Pop-up testing sites in [place]  [email or web address]  . For [facility] [email or web address]  . For [facility] testing in [place] and [employer] [email or web address]  . For [facility] testing in [facility]   [email or web address]  For  more information about community testing call [phone]   Please quarantine yourself while awaiting your test results. Please stay home for a minimum of 10 days from the first day of illness with improving symptoms and you have had 24 hours of no fever (without the use of Tylenol (Acetaminophen) Motrin (Ibuprofen) or any fever reducing medication).  Also - Do not get tested prior to returning to work because once you have had a positive test the test can stay  positive for more than a month in some cases.   You should wear a mask or cloth face covering over your nose and mouth if you must be around other people or animals, including pets (even at home). Try to stay at least 6 feet away from other people. This will protect the people around you.  Please continue good preventive care measures, including:  frequent hand-washing, avoid touching your face, cover coughs/sneezes, stay out of crowds and keep a 6 foot distance from others.  COVID-19 is a respiratory illness with symptoms that are similar to the flu. Symptoms are typically mild to moderate, but there have been cases of severe illness and death due to the virus.   The following symptoms may appear 2-14 days after exposure: . Fever . Cough . Shortness of breath or difficulty breathing . Chills . Repeated shaking with chills . Muscle pain . Headache . Sore throat . New loss of taste or smell . Fatigue . Congestion or runny nose . Nausea or vomiting . Diarrhea  Go to the nearest hospital ED for assessment if fever/cough/breathlessness are severe or illness seems like a threat to life.  It is vitally important that if you feel that you have an infection such as this virus or any other virus that you stay home and away from places where you may spread it to others.  You should avoid contact with people age 79 and older.   You may also take acetaminophen (Tylenol) as needed for fever.  Reduce your risk of any infection by using the same precautions used for avoiding the common cold or flu:  Marland Kitchen Wash your hands often with soap and warm water for at least 20 seconds.  If soap and water are not readily available, use an alcohol-based hand sanitizer with at least 60% alcohol.  . If coughing or sneezing, cover your mouth and nose by coughing or sneezing into  the elbow areas of your shirt or coat, into a tissue or into your sleeve (not your hands). . Avoid shaking hands with others and consider head nods  or verbal greetings only. . Avoid touching your eyes, nose, or mouth with unwashed hands.  . Avoid close contact with people who are sick. . Avoid places or events with large numbers of people in one location, like concerts or sporting events. . Carefully consider travel plans you have or are making. . If you are planning any travel outside or inside the [place], visit the CDC's Travelers' Health webpage for the latest health notices. . If you have some symptoms but not all symptoms, continue to monitor at home and seek medical attention if your symptoms worsen. . If you are having a medical emergency, call 911.  HOME CARE . Only take medications as instructed by your medical team. . Drink plenty of fluids and get plenty of rest. . A steam or ultrasonic humidifier can help if you have congestion.   GET HELP RIGHT AWAY IF YOU HAVE EMERGENCY WARNING SIGNS** FOR COVID-19. If you or someone is showing any of these signs seek emergency medical care immediately. Call 911 or proceed to your closest emergency facility if: . You develop worsening high fever. . Trouble breathing . Bluish lips or face . Persistent pain or pressure in the chest . New confusion . Inability to wake or stay awake . You cough up blood. . Your symptoms become more severe  **This list is not all possible symptoms. Contact your medical provider for any symptoms that are sever or concerning to you.  MAKE SURE YOU   Understand these instructions.  Will watch your condition.  Will get help right away if you are not doing well or get worse.  Your e-visit answers were reviewed by a board certified advanced clinical practitioner to complete your personal care plan.  Depending on the condition, your plan could have included both over the counter or prescription medications.  If there is a problem please reply once you have received a response from your provider.  Your safety is important to [place].  If you have drug allergies check  your prescription carefully.    You can use MyChart to ask questions about today's visit, request a non-urgent call back, or ask for a work or school excuse for 24 hours related to this e-Visit. If it has been greater than 24 hours you will need to follow up with your provider, or enter a new e-Visit to address those concerns. You will get an e-mail in the next two days asking about your experience.  I hope that your e-visit has been valuable and will speed your recovery. Thank you for using e-visits.   Greater than 5 minutes, yet less than 10 minutes of time have been spent researching, coordinating, and implementing care for this patient today

## 2020-11-21 DIAGNOSIS — Z1159 Encounter for screening for other viral diseases: Secondary | ICD-10-CM | POA: Diagnosis not present

## 2021-02-21 DIAGNOSIS — Z1231 Encounter for screening mammogram for malignant neoplasm of breast: Secondary | ICD-10-CM | POA: Diagnosis not present

## 2021-03-06 DIAGNOSIS — R928 Other abnormal and inconclusive findings on diagnostic imaging of breast: Secondary | ICD-10-CM | POA: Diagnosis not present

## 2021-03-06 DIAGNOSIS — N6489 Other specified disorders of breast: Secondary | ICD-10-CM | POA: Diagnosis not present

## 2021-03-06 DIAGNOSIS — R922 Inconclusive mammogram: Secondary | ICD-10-CM | POA: Diagnosis not present

## 2021-03-13 DIAGNOSIS — R928 Other abnormal and inconclusive findings on diagnostic imaging of breast: Secondary | ICD-10-CM | POA: Diagnosis not present

## 2021-03-13 DIAGNOSIS — S32010S Wedge compression fracture of first lumbar vertebra, sequela: Secondary | ICD-10-CM | POA: Diagnosis not present

## 2021-03-13 DIAGNOSIS — M545 Low back pain, unspecified: Secondary | ICD-10-CM | POA: Diagnosis not present

## 2021-03-15 ENCOUNTER — Other Ambulatory Visit: Payer: Self-pay | Admitting: Family Medicine

## 2021-03-15 DIAGNOSIS — S32010S Wedge compression fracture of first lumbar vertebra, sequela: Secondary | ICD-10-CM

## 2021-03-15 DIAGNOSIS — Z8781 Personal history of (healed) traumatic fracture: Secondary | ICD-10-CM

## 2021-03-28 ENCOUNTER — Other Ambulatory Visit: Payer: Self-pay

## 2021-03-28 ENCOUNTER — Ambulatory Visit
Admission: RE | Admit: 2021-03-28 | Discharge: 2021-03-28 | Disposition: A | Discharge status: Home / Self Care | Payer: Medicare Other | Source: Ambulatory Visit | Attending: Family Medicine | Admitting: Family Medicine

## 2021-03-28 DIAGNOSIS — S32010S Wedge compression fracture of first lumbar vertebra, sequela: Secondary | ICD-10-CM

## 2021-03-28 DIAGNOSIS — M545 Low back pain, unspecified: Secondary | ICD-10-CM | POA: Diagnosis not present

## 2021-03-28 DIAGNOSIS — M48061 Spinal stenosis, lumbar region without neurogenic claudication: Secondary | ICD-10-CM | POA: Diagnosis not present

## 2021-03-30 DIAGNOSIS — M5126 Other intervertebral disc displacement, lumbar region: Secondary | ICD-10-CM | POA: Diagnosis not present

## 2021-04-03 DIAGNOSIS — M3501 Sicca syndrome with keratoconjunctivitis: Secondary | ICD-10-CM | POA: Diagnosis not present

## 2021-04-04 DIAGNOSIS — M5126 Other intervertebral disc displacement, lumbar region: Secondary | ICD-10-CM | POA: Diagnosis not present

## 2021-04-06 DIAGNOSIS — M5126 Other intervertebral disc displacement, lumbar region: Secondary | ICD-10-CM | POA: Diagnosis not present

## 2021-04-11 DIAGNOSIS — M5126 Other intervertebral disc displacement, lumbar region: Secondary | ICD-10-CM | POA: Diagnosis not present

## 2021-04-24 ENCOUNTER — Other Ambulatory Visit: Payer: Self-pay

## 2021-04-24 DIAGNOSIS — C50911 Malignant neoplasm of unspecified site of right female breast: Secondary | ICD-10-CM | POA: Diagnosis not present

## 2021-04-24 DIAGNOSIS — C50811 Malignant neoplasm of overlapping sites of right female breast: Secondary | ICD-10-CM | POA: Diagnosis not present

## 2021-04-24 DIAGNOSIS — D0511 Intraductal carcinoma in situ of right breast: Secondary | ICD-10-CM | POA: Diagnosis not present

## 2021-04-27 ENCOUNTER — Telehealth: Payer: Self-pay | Admitting: Hematology

## 2021-04-27 ENCOUNTER — Encounter: Payer: Self-pay | Admitting: *Deleted

## 2021-04-27 NOTE — Telephone Encounter (Signed)
Spoke to patient to confirm morning clinic appointment for 7/13, [facility] sending paperwork

## 2021-04-28 ENCOUNTER — Encounter: Payer: Self-pay | Admitting: Family Medicine

## 2021-05-05 ENCOUNTER — Other Ambulatory Visit: Payer: Self-pay | Admitting: *Deleted

## 2021-05-05 ENCOUNTER — Encounter: Payer: Self-pay | Admitting: *Deleted

## 2021-05-05 DIAGNOSIS — C50411 Malignant neoplasm of upper-outer quadrant of right female breast: Secondary | ICD-10-CM | POA: Insufficient documentation

## 2021-05-08 ENCOUNTER — Other Ambulatory Visit: Payer: Self-pay | Admitting: *Deleted

## 2021-05-08 DIAGNOSIS — C50411 Malignant neoplasm of upper-outer quadrant of right female breast: Secondary | ICD-10-CM

## 2021-05-09 NOTE — Progress Notes (Signed)
[place] NOTE  Patient Care Team: [name], MD as PCP - General (Family Medicine) Jerline Pain, MD as PCP - Cardiology (Cardiology) Mauro Kaufmann, RN as Oncology Nurse Navigator Rockwell Germany, RN as Oncology Nurse Navigator Erroll Luna, MD as Consulting Physician (General Surgery) Nicholas Lose, MD as Consulting Physician (Hematology and Oncology) Kyung Rudd, MD as Consulting Physician (Radiation Oncology)  CHIEF COMPLAINTS/PURPOSE OF CONSULTATION:  Newly diagnosed right breast cancer  HISTORY OF PRESENTING ILLNESS:  Nichole Ritter 70 y.o. female is here because of recent diagnosis of cancer of the right breast. Screening mammogram on 02/21/21 showed possible architectural distortion at the 9 o'clock position middle depth in the right breast 7.5 cm from the nipple, asymmetry in the right breast posterior depth inferior region 9.9 cm from the nipple, and group calcifications UIQ middle depth 9.5 cm from the nipple. Diagnostic mammogram and [place] on 03/06/21 showed suspicious 0.9 cm architectural distortion in the right breast 9 o'clock position 8 cm from the nipple and 1.2 cm group of pleomorphic calcifications in the right breat suspicious for malignancy at 12 o'clock position posterior depth 10 cm from the nipple. 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 She presents to the clinic today for initial evaluation and discussion of treatment options.   I reviewed her records extensively and collaborated the history with the patient.  SUMMARY OF ONCOLOGIC HISTORY: Oncology History  Malignant neoplasm of upper-outer quadrant of right female breast ([place])  04/24/2021 Initial Diagnosis   Screening mammogram detected architectural distortion 9:00 measuring 0.9 cm biopsy revealed invasive mammary cancer with lobular  features prognostic panel pending, right breast calcifications at 12:00 1.2 cm, biopsy showed DCIS with LCIS high-grade with necrosis and calcifications ER greater than 95%, PR 40%   05/10/2021 Cancer Staging   Staging form: Breast, AJCC 8th Edition - Clinical stage from 05/10/2021: cT1b, cN0, cM0, G1, ER+, PR+, HER2: Unknown - Signed by Nicholas Lose, MD on 05/10/2021  Stage prefix: Initial diagnosis  Histologic grading system: 3 grade system  Laterality: Right  Staged by: Pathologist and managing physician  Stage used in treatment planning: Yes  National guidelines used in treatment planning: Yes  Type of national guideline used in treatment planning: NCCN      MEDICAL HISTORY:  Past Medical History:  Diagnosis Date   Allergic rhinitis    Aortic atherosclerosis ([place])    Barrett esophagus    Bunion    Female cystocele    GERD (gastroesophageal reflux disease)    History of epistaxis 2012   Hyperlipidemia    UNSPECIFIED   Lichen sclerosus    Obesity    Osteopenia    Personal history of colonic polyps    Rectocele    Situational stress    Stress incontinence    Vitamin D deficiency     SURGICAL HISTORY: Past Surgical History:  Procedure Laterality Date   BREAST LUMPECTOMY     CATARACT EXTRACTION Bilateral 02/2018   03/2018   COLONOSCOPY  01/2013   LUMBAR DISC SURGERY     TUBAL LIGATION      SOCIAL HISTORY: Social History   Socioeconomic History   Marital status: Married    Spouse name: Not on file   Number of children: Not on file   Years of education: Not on file   Highest education level: Not on file  Occupational History  Not on file  Tobacco Use   Smoking status: Never   Smokeless tobacco: Never  Vaping Use   Vaping Use: Never used  Substance and Sexual Activity   Alcohol use: No   Drug use: Never   Sexual activity: Not on file  Other Topics Concern   Not on file  Social History Narrative   Not on file   Social Determinants of Health    Financial Resource Strain: Not on file  Food Insecurity: Not on file  Transportation Needs: Not on file  Physical Activity: Not on file  Stress: Not on file  Social Connections: Not on file  Intimate Partner Violence: Not on file    FAMILY HISTORY: Family History  Problem Relation Age of Onset   Diabetes Mother    Hypertension Mother    Cancer Mother    Stroke Mother    Lung cancer Sister    Leukemia Brother    Prostate cancer Brother     ALLERGIES:  is allergic to caine-1 [lidocaine], esomeprazole magnesium, formaldehyde, and nickel.  MEDICATIONS:  Current Outpatient Medications  Medication Sig Dispense Refill   atorvastatin (LIPITOR) 10 MG tablet Take 10 mg by mouth daily.     B Complex-C (SUPER B COMPLEX PO) Take 1 tablet by mouth daily.      betamethasone dipropionate (DIPROLENE) 0.05 % cream Apply 1 application topically 2 (two) times daily as needed.      Calcium-Magnesium-Vitamin D (CALCIUM 1200+D3 PO) Take 1 tablet by mouth daily.      Cholecalciferol (VITAMIN D3) 50 MCG (2000 UT) capsule Take 2,000 Units by mouth daily.     cycloSPORINE (RESTASIS) 0.05 % ophthalmic emulsion 1 drop 2 (two) times daily.     fluticasone (FLONASE) 50 MCG/ACT nasal spray Place into both nostrils daily.     LUTEIN PO Take by mouth.     Multiple Vitamins-Minerals (CENTRUM SILVER 50+WOMEN PO) Take 1 tablet by mouth daily.      OMEPRAZOLE PO Take 20 mg by mouth.     pantoprazole (PROTONIX) 40 MG tablet Take 40 mg by mouth daily. (Patient not taking: Reported on 05/10/2021)     No current facility-administered medications for this visit.    REVIEW OF SYSTEMS:   Constitutional: Denies fevers, chills or abnormal night sweats Eyes: Denies blurriness of vision, double vision or watery eyes Ears, nose, mouth, throat, and face: Denies mucositis or sore throat Respiratory: Denies cough, dyspnea or wheezes Cardiovascular: Denies palpitation, chest discomfort or lower extremity  swelling Gastrointestinal:  Denies nausea, heartburn or change in bowel habits Skin: Denies abnormal skin rashes Lymphatics: Denies new lymphadenopathy or easy bruising Neurological:Denies numbness, tingling or new weaknesses Behavioral/Psych: Mood is stable, no new changes  Breast:  Denies any palpable lumps or discharge All other systems were reviewed with the patient and are negative.  PHYSICAL EXAMINATION: ECOG PERFORMANCE STATUS: 1 - Symptomatic but completely ambulatory  Vitals:   05/10/21 0931  BP: (!) 157/70  Pulse: (!) 51  Resp: 18  Temp: 97.9 F (36.6 C)  SpO2: 99%   Filed Weights   05/10/21 0931  Weight: 190 lb 8 oz (86.4 kg)      LABORATORY DATA:  I have reviewed the data as listed Lab Results  Component Value Date   WBC 6.6 05/10/2021   HGB 15.4 (H) 05/10/2021   HCT 44.5 05/10/2021   MCV 90.6 05/10/2021   PLT 326 05/10/2021   Lab Results  Component Value Date   NA 142 05/10/2021  K 4.0 05/10/2021   CL 105 05/10/2021   CO2 28 05/10/2021    RADIOGRAPHIC STUDIES: I have personally reviewed the radiological reports and agreed with the findings in the report.  ASSESSMENT AND PLAN:  Malignant neoplasm of upper-outer quadrant of right female breast ([place]) 04/24/2021: Screening mammogram detected architectural distortion 9:00 measuring 0.9 cm biopsy revealed invasive mammary cancer with lobular features prognostic panel pending, right breast calcifications at 12:00 1.2 cm, biopsy showed DCIS with LCIS high-grade with necrosis and calcifications ER greater than 95%, PR 40%  Pathology and radiology counseling:Discussed with the patient, the details of pathology including the type of breast cancer,the clinical staging, the significance of ER, PR and HER-2/neu receptors and the implications for treatment. After reviewing the pathology in detail, we proceeded to discuss the different treatment options between surgery, radiation, chemotherapy, antiestrogen  therapies.  Recommendations: Breast MRI will be performed 1. Breast conserving surgery followed by 2. Oncotype DX testing to determine if chemotherapy would be of any benefit followed by 3. Adjuvant radiation therapy followed by 4. Adjuvant antiestrogen therapy  Oncotype counseling: I discussed Oncotype DX test. I explained to the patient that this is a 21 gene panel to evaluate patient tumors DNA to calculate recurrence score. This would help determine whether patient has high risk or low risk breast cancer. She understands that if her tumor was found to be high risk, she would benefit from systemic chemotherapy. If low risk, no need of chemotherapy.  Return to clinic after surgery to discuss final pathology report and then determine if Oncotype DX testing will need to be sent.     All questions were answered. The patient knows to call the clinic with any problems, questions or concerns.   Rulon Eisenmenger, MD, MPH 05/10/2021    I, Thana Ates, am acting as scribe for Nicholas Lose, MD.  I have reviewed the above documentation for accuracy and completeness, and I agree with the above.

## 2021-05-10 ENCOUNTER — Ambulatory Visit: Payer: Self-pay | Admitting: Surgery

## 2021-05-10 ENCOUNTER — Inpatient Hospital Stay (HOSPITAL_BASED_OUTPATIENT_CLINIC_OR_DEPARTMENT_OTHER): Payer: Medicare Other | Admitting: Genetic Counselor

## 2021-05-10 ENCOUNTER — Encounter: Payer: Self-pay | Admitting: Physical Therapy

## 2021-05-10 ENCOUNTER — Encounter: Payer: Self-pay | Admitting: Genetic Counselor

## 2021-05-10 ENCOUNTER — Ambulatory Visit: Payer: Medicare Other | Attending: Surgery | Admitting: Physical Therapy

## 2021-05-10 ENCOUNTER — Inpatient Hospital Stay: Payer: Medicare Other

## 2021-05-10 ENCOUNTER — Other Ambulatory Visit: Payer: Self-pay | Admitting: *Deleted

## 2021-05-10 ENCOUNTER — Other Ambulatory Visit: Payer: Self-pay

## 2021-05-10 ENCOUNTER — Encounter: Payer: Self-pay | Admitting: *Deleted

## 2021-05-10 ENCOUNTER — Ambulatory Visit
Admission: RE | Admit: 2021-05-10 | Discharge: 2021-05-10 | Disposition: A | Discharge status: Home / Self Care | Payer: Medicare Other | Source: Ambulatory Visit | Attending: Radiation Oncology | Admitting: Radiation Oncology

## 2021-05-10 ENCOUNTER — Inpatient Hospital Stay: Payer: Medicare Other | Attending: Hematology and Oncology | Admitting: Hematology and Oncology

## 2021-05-10 ENCOUNTER — Encounter: Payer: Self-pay | Admitting: Licensed Clinical Social Worker

## 2021-05-10 DIAGNOSIS — Z803 Family history of malignant neoplasm of breast: Secondary | ICD-10-CM | POA: Diagnosis not present

## 2021-05-10 DIAGNOSIS — Z170 Estrogen receptor positive status [ER+]: Secondary | ICD-10-CM

## 2021-05-10 DIAGNOSIS — Z801 Family history of malignant neoplasm of trachea, bronchus and lung: Secondary | ICD-10-CM

## 2021-05-10 DIAGNOSIS — R293 Abnormal posture: Secondary | ICD-10-CM | POA: Diagnosis not present

## 2021-05-10 DIAGNOSIS — Z809 Family history of malignant neoplasm, unspecified: Secondary | ICD-10-CM

## 2021-05-10 DIAGNOSIS — Z8042 Family history of malignant neoplasm of prostate: Secondary | ICD-10-CM | POA: Diagnosis not present

## 2021-05-10 DIAGNOSIS — Z806 Family history of leukemia: Secondary | ICD-10-CM

## 2021-05-10 DIAGNOSIS — C50411 Malignant neoplasm of upper-outer quadrant of right female breast: Secondary | ICD-10-CM | POA: Diagnosis not present

## 2021-05-10 DIAGNOSIS — Z8041 Family history of malignant neoplasm of ovary: Secondary | ICD-10-CM

## 2021-05-10 DIAGNOSIS — C50911 Malignant neoplasm of unspecified site of right female breast: Secondary | ICD-10-CM

## 2021-05-10 LAB — CMP (CANCER CENTER ONLY)
ALT: 19 U/L (ref 0–44)
AST: 21 U/L (ref 15–41)
Albumin: 3.6 g/dL (ref 3.5–5.0)
Alkaline Phosphatase: 81 U/L (ref 38–126)
Anion gap: 9 (ref 5–15)
BUN: 10 mg/dL (ref 8–23)
CO2: 28 mmol/L (ref 22–32)
Calcium: 9.5 mg/dL (ref 8.9–10.3)
Chloride: 105 mmol/L (ref 98–111)
Creatinine: 0.81 mg/dL (ref 0.44–1.00)
GFR, Estimated: >60 mL/min (ref >60)
Glucose, Bld: 96 mg/dL (ref 70–99)
Potassium: 4 mmol/L (ref 3.5–5.1)
Sodium: 142 mmol/L (ref 135–145)
Total Bilirubin: 0.8 mg/dL (ref 0.3–1.2)
Total Protein: 7.4 g/dL (ref 6.5–8.1)

## 2021-05-10 LAB — CBC WITH DIFFERENTIAL (CANCER CENTER ONLY)
Abs Immature Granulocytes: 0.01 10*3/uL (ref 0.00–0.07)
Basophils Absolute: 0 10*3/uL (ref 0.0–0.1)
Basophils Relative: 1 %
Eosinophils Absolute: 0.4 10*3/uL (ref 0.0–0.5)
Eosinophils Relative: 6 %
HCT: 44.5 % (ref 36.0–46.0)
Hemoglobin: 15.4 g/dL — ABNORMAL HIGH (ref 12.0–15.0)
Immature Granulocytes: 0 %
Lymphocytes Relative: 34 %
Lymphs Abs: 2.2 10*3/uL (ref 0.7–4.0)
MCH: 31.4 pg (ref 26.0–34.0)
MCHC: 34.6 g/dL (ref 30.0–36.0)
MCV: 90.6 fL (ref 80.0–100.0)
Monocytes Absolute: 0.5 10*3/uL (ref 0.1–1.0)
Monocytes Relative: 8 %
Neutro Abs: 3.4 10*3/uL (ref 1.7–7.7)
Neutrophils Relative %: 51 %
Platelet Count: 326 10*3/uL (ref 150–400)
RBC: 4.91 MIL/uL (ref 3.87–5.11)
RDW: 13 % (ref 11.5–15.5)
WBC Count: 6.6 10*3/uL (ref 4.0–10.5)
nRBC: 0 % (ref 0.0–0.2)

## 2021-05-10 LAB — GENETIC SCREENING ORDER

## 2021-05-10 NOTE — Progress Notes (Signed)
Radiation Oncology         [phone] ________________________________  Name: Nichole Ritter        MRN: [id number]  Date of Service: 05/10/2021 DOB: 11-30-1950  CC:[name], MD  Erroll Luna, MD     REFERRING PHYSICIAN: Erroll Luna, MD   DIAGNOSIS: The encounter diagnosis was Malignant neoplasm of upper-outer quadrant of right breast in female, estrogen receptor positive ([place]).   HISTORY OF PRESENT ILLNESS: Nichole Ritter is a 70 y.o. female seen in the multidisciplinary breast clinic for a new diagnosis of right breast cancer. The patient was noted to have screening detected architectural distorion and calcifications in the right breast. Her disortion was at 9:00 and had no ultrasound correlate but the calcifications spanned 1.2 cm in the 12:00 position. The axilla was negative for adenopathy. She underwent stereotactic biopsy on 04/24/21 that revealed an invasive lobular carcinoma in the 9:00 biopsy there was not a grade given in her report, and insufficient tissue was present for prognostic markers. Biopsies of the 12:00 calcifications were consistent with high grade DCIS with calcifications necrosis, and ALH. This biopsy was ER/PR positive. She's seen today to discuss treatment of her cancer.     PREVIOUS RADIATION THERAPY: No   PAST MEDICAL HISTORY:  Past Medical History:  Diagnosis Date   Allergic rhinitis    Aortic atherosclerosis ([facility])    Barrett esophagus    Bunion    Female cystocele    GERD (gastroesophageal reflux disease)    History of epistaxis 2012   Hyperlipidemia    UNSPECIFIED   Lichen sclerosus    Obesity    Osteopenia    Personal history of colonic polyps    Rectocele    Situational stress    Stress incontinence    Vitamin D deficiency        PAST SURGICAL HISTORY: Past Surgical History:  Procedure Laterality Date   BREAST LUMPECTOMY     CATARACT EXTRACTION Bilateral 02/2018   03/2018   COLONOSCOPY  01/2013   LUMBAR DISC SURGERY      TUBAL LIGATION       FAMILY HISTORY:  Family History  Problem Relation Age of Onset   Diabetes Mother    Hypertension Mother    Cancer Mother    Stroke Mother      SOCIAL HISTORY:  reports that she has never smoked. She has never used smokeless tobacco. She reports that she does not drink alcohol and does not use drugs. The patient is married and lives in [place]. She is a Theme park manager. She is active and enjoys spending time with her 75 year old grand son.    ALLERGIES: Caine-1 [lidocaine], Esomeprazole magnesium, Formaldehyde, and Nickel   MEDICATIONS:  Current Outpatient Medications  Medication Sig Dispense Refill   B Complex-C (SUPER B COMPLEX PO) Take 1 tablet by mouth daily.      betamethasone dipropionate (DIPROLENE) 0.05 % cream Apply 1 application topically 2 (two) times daily as needed.      Calcium-Magnesium-Vitamin D (CALCIUM 1200+D3 PO) Take 1 tablet by mouth daily.      Cholecalciferol (VITAMIN D3) 50 MCG (2000 UT) capsule Take 2,000 Units by mouth daily.     cycloSPORINE (RESTASIS) 0.05 % ophthalmic emulsion 1 drop 2 (two) times daily.     fluticasone (FLONASE) 50 MCG/ACT nasal spray Place into both nostrils daily.     LUTEIN PO Take by mouth.     Multiple Vitamins-Minerals (CENTRUM SILVER 50+WOMEN PO) Take 1 tablet by mouth  daily.      pantoprazole (PROTONIX) 40 MG tablet Take 40 mg by mouth daily.     rosuvastatin (CRESTOR) 5 MG tablet Take 1 tablet (5 mg total) by mouth daily. 90 tablet 3   No current facility-administered medications for this encounter.     REVIEW OF SYSTEMS: On review of systems, the patient reports that she is doing well overall. No specific breast complaints are verbalized.     PHYSICAL EXAM:  Wt Readings from Last 3 Encounters:  01/06/19 189 lb 6.4 oz (85.9 kg)  10/06/18 181 lb (82.1 kg)  10/01/18 181 lb 12.8 oz (82.5 kg)   Temp Readings from Last 3 Encounters:  03/23/18 98.2 F (36.8 C) (Oral)  10/12/13 97.8 F (36.6 C)  (Oral)   BP Readings from Last 3 Encounters:  01/06/19 (!) 144/100  10/01/18 140/80  03/23/18 (!) 155/78   Pulse Readings from Last 3 Encounters:  01/06/19 65  10/01/18 65  03/23/18 60    In general this is a well appearing caucasian female in no acute distress. She's alert and oriented x4 and appropriate throughout the examination. Cardiopulmonary assessment is negative for acute distress and she exhibits normal effort. Bilateral breast exam is deferred.    ECOG = 0  0 - Asymptomatic (Fully active, able to carry on all predisease activities without restriction)  1 - Symptomatic but completely ambulatory (Restricted in physically strenuous activity but ambulatory and able to carry out work of a light or sedentary nature. For example, light housework, office work)  2 - Symptomatic, <50% in bed during the day (Ambulatory and capable of all self care but unable to carry out any work activities. Up and about more than 50% of waking hours)  3 - Symptomatic, >50% in bed, but not bedbound (Capable of only limited self-care, confined to bed or chair 50% or more of waking hours)  4 - Bedbound (Completely disabled. Cannot carry on any self-care. Totally confined to bed or chair)  5 - Death   Eustace Pen MM, Creech RH, Tormey DC, et al. [phone]). "Toxicity and response criteria of the [facility]". [place] Oncol. 5 (6): 649-55    LABORATORY DATA:  Lab Results  Component Value Date   WBC 7.3 07/30/2011   HGB 11.3 (L) 07/30/2011   HCT 32.5 (L) 07/30/2011   MCV 88.8 07/30/2011   PLT 339 07/30/2011   No results found for: NA, K, CL, CO2 Lab Results  Component Value Date   ALT 22 12/02/2018      RADIOGRAPHY: No results found.     IMPRESSION/PLAN: 1. 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 Dr. Lisbeth Renshaw discusses the pathology findings and reviews the nature of early stage right breast disease. The  consensus from the breast conference includes an MRI for extent of disease with breast conservation with lumpectomy x2 and sentinel node biopsy. Dr. Lindi Adie anticipates oncotype dx score to rule out a role for systemic therapy.  Dr. Lisbeth Renshaw discusses the rationale for external radiotherapy to the breast  to reduce risks of local recurrence followed by antiestrogen therapy. We discussed the risks, benefits, short, and long term effects of radiotherapy, as well as the curative intent, and the patient is interested in proceeding. Dr. Lisbeth Renshaw discusses the delivery and logistics of radiotherapy and anticipates a course of 4 or up to 6 1/2 weeks of radiotherapy to the right breast. We will see her back a few weeks after  surgery to discuss the simulation process and anticipate we starting radiotherapy about 4-6 weeks after surgery.    In a visit lasting 60 minutes, greater than 50% of the time was spent face to face reviewing her case, as well as in preparation of, discussing, and coordinating the patient's care.  The above documentation reflects my direct findings during this shared patient visit. Please see the separate note by Dr. Lisbeth Renshaw on this date for the remainder of the patient's plan of care.    Carola Rhine, [facility]    **Disclaimer: This note was dictated with voice recognition software. Similar sounding words can inadvertently be transcribed and this note may contain transcription errors which may not have been corrected upon publication of note.**

## 2021-05-10 NOTE — Progress Notes (Signed)
REFERRING PROVIDER: Nicholas Lose, MD [place],  [place] [id number]  PRIMARY PROVIDER:  [name], MD  PRIMARY REASON FOR VISIT:  1. Malignant neoplasm of upper-outer quadrant of right breast in female, estrogen receptor positive ([place])   2. Family history of prostate cancer   3. Family history of ovarian cancer   4. Family history of lung cancer   5. Family history of breast cancer   6. Family history of leukemia       HISTORY OF PRESENT ILLNESS:   Nichole Ritter, a 70 y.o. female, was seen for a [facility] cancer genetics consultation at the request of Dr. Lindi Adie due to a personal and family history of cancer.  Nichole Ritter presents to clinic today to discuss the possibility of a hereditary predisposition to cancer, genetic testing, and to further clarify her future cancer risks, as well as potential cancer risks for family members.   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 The tumor is ER+ and PR+. The treatment plan includes surgery, oncotype DX testing, radiation therapy, and antiestrogen therapy.    CANCER HISTORY:  Oncology History  Malignant neoplasm of upper-outer quadrant of right female breast ([place])  04/24/2021 Initial Diagnosis   Screening mammogram detected architectural distortion 9:00 measuring 0.9 cm biopsy revealed invasive mammary cancer with lobular features prognostic panel pending, right breast calcifications at 12:00 1.2 cm, biopsy showed DCIS with LCIS high-grade with necrosis and calcifications ER greater than 95%, PR 40%   05/10/2021 Cancer Staging   Staging form: Breast, AJCC 8th Edition - Clinical stage from 05/10/2021: cT1b, cN0, cM0, G1, ER+, PR+, HER2: Unknown - Signed by Nicholas Lose, MD on 05/10/2021  Stage prefix: Initial diagnosis  Histologic grading system: 3 grade system  Laterality: Right  Staged by: Pathologist and managing  physician  Stage used in treatment planning: Yes  National guidelines used in treatment planning: Yes  Type of national guideline used in treatment planning: NCCN      RISK FACTORS:  Menarche was at age 44.  First live birth at age 42.  OCP use for approximately 1 years.  Ovaries intact: yes.  Hysterectomy: no.  Menopausal status: postmenopausal.  HRT use: 0 years. Colonoscopy: yes;  2014 . Mammogram within the last year: yes.   Past Medical History:  Diagnosis Date   Allergic rhinitis    Aortic atherosclerosis ([place])    Barrett esophagus    Bunion    Family history of breast cancer    Family history of leukemia    Family history of lung cancer    Family history of ovarian cancer    Family history of prostate cancer    Female cystocele    GERD (gastroesophageal reflux disease)    History of epistaxis 2012   Hyperlipidemia    UNSPECIFIED   Lichen sclerosus    Obesity    Osteopenia    Personal history of colonic polyps    Rectocele    Situational stress    Stress incontinence    Vitamin D deficiency     Past Surgical History:  Procedure Laterality Date   BREAST LUMPECTOMY     CATARACT EXTRACTION Bilateral 02/2018   03/2018   COLONOSCOPY  01/2013   LUMBAR DISC SURGERY     TUBAL LIGATION      Social History   Socioeconomic History   Marital status: Married  Spouse name: Not on file   Number of children: Not on file   Years of education: Not on file   Highest education level: Not on file  Occupational History   Not on file  Tobacco Use   Smoking status: Never   Smokeless tobacco: Never  Vaping Use   Vaping Use: Never used  Substance and Sexual Activity   Alcohol use: No   Drug use: Never   Sexual activity: Not on file  Other Topics Concern   Not on file  Social History Narrative   Not on file   Social Determinants of Health   Financial Resource Strain: Not on file  Food Insecurity: No Food Insecurity   Worried About Running Out of Food  in the Last Year: Never true   [place] in the Last Year: Never true  Transportation Needs: No Transportation Needs   Lack of Transportation (Medical): No   Lack of Transportation (Non-Medical): No  Physical Activity: Not on file  Stress: Not on file  Social Connections: Not on file     FAMILY HISTORY:  We obtained a detailed, 4-generation family history.  Significant diagnoses are listed below: Family History  Problem Relation Age of Onset   Diabetes Mother    Hypertension Mother    Breast cancer Mother 8   Stroke Mother    Polycythemia Father        dx 13s   Lung cancer Sister 52       hx smoking   Leukemia Brother 74       acute   Prostate cancer Brother 82   Cancer Maternal Aunt        unknown type, dx >50   Heart attack Paternal Uncle    Cancer Cousin        multiple maternal cousins w/cancer - unknown types   Breast cancer Niece        dx 19s, metastatic   Ovarian cancer Niece        dx 30s/40s   Prostate cancer Nephew        dx 40s/50s   Nichole Ritter has one son (age 12). She had seven brothers and three sisters. One sister Hoyle Sauer) had lung cancer at age 24. A brother Jenny Reichmann) had acute leukemia at age 47. Another brother Vicente Serene) had prostate cancer at age 75. A nephew had prostate cancer in his 70s or 28s, a niece has metastatic breast cancer diagnosed in her 52s, and another niece had ovarian cancer in her 38s or 16s.  Nichole Ritter mother died at age 35 and had breast cancer at age 37. There were five maternal aunts and three maternal uncles. One aunt had an unknown cancer older than 64. Multiple maternal cousins have had cancer, types unknown. Nichole Ritter maternal grandparents died older than 60 without cancer.  Nichole Ritter father died at age 72 and had polycythemia vera diagnosed in his 69s. There were two paternal uncles. One paternal cousin may have had breast cancer. Nichole Ritter paternal grandmother died in her 18s without cancer. Her paternal grandfather died  at age 35 without cancer.   Nichole Ritter is unaware of previous family history of genetic testing for hereditary cancer risks. Patient's maternal ancestors are of [place] descent, and paternal ancestors are of [place] and [place] descent. There is no reported Ashkenazi Jewish ancestry. There is no known consanguinity.  GENETIC COUNSELING ASSESSMENT: Nichole Ritter is a 70 y.o. female with a personal history of breast cancer and a  family history of cancer which is somewhat suggestive of a hereditary cancer syndrome and predisposition to cancer. We, therefore, discussed and recommended the following at today's visit.   DISCUSSION: We discussed that approximately 5-10% of breast cancer is hereditary, with most cases associated with the BRCA1 and BRCA2 genes. There are other genes that can be associated with hereditary breast cancer syndromes. These include ATM, CHEK2, PALB2, etc. We discussed that testing is beneficial for several reasons, including knowing about other cancer risks, identifying potential screening and risk-reduction options that may be appropriate, and to understand if other family members could be at risk for cancer and allow them to undergo genetic testing.   We reviewed the characteristics, features and inheritance patterns of hereditary cancer syndromes. We also discussed genetic testing, including the appropriate family members to test, the process of testing, insurance coverage and turn-around-time for results. We discussed the implications of a negative, positive and/or variant of uncertain significant result. In order to get genetic test results in a timely manner so that Nichole Ritter can use these genetic test results for surgical decisions, we recommended Nichole Ritter pursue genetic testing for the [employer]. Once complete, we recommend Nichole Ritter pursue reflex genetic testing to the CancerNext-Expanded + RNAinsight gene panel.   The BRCAplus panel offered by [employer] and includes  sequencing and deletion/duplication analysis for the following 8 genes: ATM, BRCA1, BRCA2, CDH1, CHEK2, PALB2, PTEN, and TP53. The CancerNext-Expanded + RNAinsight gene panel offered by [employer] and includes sequencing and rearrangement analysis for the following 77 genes: AIP, ALK, APC, ATM, AXIN2, BAP1, BARD1, BLM, BMPR1A, BRCA1, BRCA2, BRIP1, CDC73, CDH1, CDK4, CDKN1B, CDKN2A, CHEK2, CTNNA1, DICER1, FANCC, FH, FLCN, GALNT12, KIF1B, LZTR1, MAX, MEN1, MET, MLH1, MSH2, MSH3, MSH6, MUTYH, NBN, NF1, NF2, NTHL1, PALB2, PHOX2B, PMS2, POT1, PRKAR1A, PTCH1, PTEN, RAD51C, RAD51D, RB1, RECQL, RET, SDHA, SDHAF2, SDHB, SDHC, SDHD, SMAD4, SMARCA4, SMARCB1, SMARCE1, STK11, SUFU, TMEM127, TP53, TSC1, TSC2, VHL and XRCC2 (sequencing and deletion/duplication); EGFR, EGLN1, HOXB13, KIT, MITF, PDGFRA, POLD1 and POLE (sequencing only); EPCAM and GREM1 (deletion/duplication only). RNA data is routinely analyzed for use in variant interpretation for all genes.  Based on Nichole Ritter's personal and family history of cancer, she meets medical criteria for genetic testing. Despite that she meets criteria, she may still have an out of pocket cost.   PLAN: After considering the risks, benefits, and limitations, Nichole Ritter provided informed consent to pursue genetic testing and the blood sample was sent to [facility] for analysis of the BRCAplus panel and CancerNext-Expanded + RNAinsight panel. Results should be available within approximately one-two weeks' time, at which point they will be disclosed by telephone to Nichole Ritter, as will any additional recommendations warranted by these results. Nichole Ritter will receive a summary of her genetic counseling visit and a copy of her results once available. This information will also be available in Epic.    Nichole Ritter questions were answered to her satisfaction today. Our contact information was provided should additional questions or concerns arise. Thank you for the referral and allowing [place]  to share in the care of your patient.   Clint Guy, [place], [facility] Licensed, Certified Dispensing [email or web address] Phone: [phone]  The patient was seen for a total of 25 minutes in face-to-face genetic counseling. Patient was seen alone. This patient was discussed with Drs. Magrinat, Lindi Adie and/or Burr Medico who agrees with the above.    _______________________________________________________________________ For Office Staff:  Number of people involved in session: 1 Was an  Intern/ student involved with case: no

## 2021-05-10 NOTE — Assessment & Plan Note (Signed)
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 After reviewing the pathology in detail, we proceeded to discuss the different treatment options between surgery, radiation, chemotherapy, antiestrogen therapies.  Recommendations: Breast MRI will be performed 1. Breast conserving surgery followed by 2. Oncotype DX testing to determine if chemotherapy would be of any benefit followed by 3. Adjuvant radiation therapy followed by 4. Adjuvant antiestrogen therapy  Oncotype counseling: I discussed Oncotype DX test. I explained to the patient that this is a 21 gene panel to evaluate patient tumors DNA to calculate recurrence score. This would help determine whether patient has high risk or low risk breast cancer. She understands that if her tumor was found to be high risk, she would benefit from systemic chemotherapy. If low risk, no need of chemotherapy.  Return to clinic after surgery to discuss final pathology report and then determine if Oncotype DX testing will need to be sent.

## 2021-05-10 NOTE — Therapy (Signed)
[place], [place], [address] Phone: [phone]   Fax:  [phone]  Physical Therapy Evaluation  Patient Details  Name: Belinda Schlichting MRN: [id number] Date of Birth: [birth year] Referring Provider (PT): Dr. Erroll Luna   Encounter Date: 05/10/2021   PT End of Session - 05/10/21 1037     Visit Number 1    Number of Visits 2    Date for PT Re-Evaluation 07/05/21    PT Start Time 0933    PT Stop Time 0958   Also saw pt from 1015-1024 for a total of 34 minutes   PT Time Calculation (min) 25 min    Activity Tolerance Patient tolerated treatment well    Behavior During Therapy [facility] for tasks assessed/performed             Past Medical History:  Diagnosis Date   Allergic rhinitis    Aortic atherosclerosis ([place])    Barrett esophagus    Bunion    Female cystocele    GERD (gastroesophageal reflux disease)    History of epistaxis 2012   Hyperlipidemia    UNSPECIFIED   Lichen sclerosus    Obesity    Osteopenia    Personal history of colonic polyps    Rectocele    Situational stress    Stress incontinence    Vitamin D deficiency     Past Surgical History:  Procedure Laterality Date   BREAST LUMPECTOMY     CATARACT EXTRACTION Bilateral 02/2018   03/2018   COLONOSCOPY  01/2013   LUMBAR DISC SURGERY     TUBAL LIGATION      There were no vitals filed for this visit.    Subjective Assessment - 05/10/21 1026     Subjective Patient reports she is here today to be seen by her medical team for her newly diagnosed right breast cancer.    Patient is accompained by: Family member    Pertinent History Patient was diagnosed on 02/21/2021 with right invasive lobular carcinoma breast cancer. It measures 1.2 cm of calcifiations of DCIS and a 9 mm mass of invasive cancer in the upper outer quadrant. The DCIS is ER/PR positive and the mass had insufficient tissue to determine a prognostic panel which will  be performed on final pathology. 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    Patient Stated Goals Learn HEP for post op and lymphedema risk reduction    Currently in Pain? Yes    Pain Score 10-Worst pain ever   0/10 now but 10/10 with rolling in bed   Pain Location Back    Pain Orientation Lower    Pain Descriptors / Indicators Other (Comment)   Pinching   Pain Type Chronic pain    Pain Onset More than a month ago    Pain Frequency Intermittent    Aggravating Factors  Rolling in bed    Pain Relieving Factors Walking    Multiple Pain Sites No                [facility] PT Assessment - 05/10/21 0001       Assessment   Medical Diagnosis Right breast cancer    Referring Provider (PT) Dr. Marcello Moores [place]    Onset Date/Surgical Date 02/21/21    Hand Dominance Right    Prior Therapy none for breast cancer; recently dischargedd from PT for back pain      Precautions   Precautions Other (comment)  Precaution Comments active cancer; lumbar HNP      Restrictions   Weight Bearing Restrictions No      Balance Screen   Has the patient fallen in the past 6 months No    Has the patient had a decrease in activity level because of a fear of falling?  No    Is the patient reluctant to leave their home because of a fear of falling?  No      Home Ecologist residence    Living Arrangements Spouse/significant other    Available Help at Discharge Family      Prior Function   Level of Independence Independent    Vocation Part time employment    Freight forwarder    Leisure She does a HEP for her back but no other exercise      Cognition   Overall Cognitive Status Within Functional Limits for tasks assessed      Posture/Postural Control   Posture/Postural Control Postural limitations    Postural Limitations Rounded Shoulders;Forward head      ROM / Strength   AROM / PROM / Strength AROM;Strength      AROM    Overall AROM Comments Cervical AROM is all limited 50% except flexion is WNL    AROM Assessment Site Shoulder    Right/Left Shoulder Right;Left    Right Shoulder Extension 52 Degrees    Right Shoulder Flexion 142 Degrees    Right Shoulder ABduction 160 Degrees    Right Shoulder Internal Rotation 62 Degrees    Right Shoulder External Rotation 90 Degrees    Left Shoulder Extension 49 Degrees    Left Shoulder Flexion 148 Degrees    Left Shoulder ABduction 151 Degrees    Left Shoulder Internal Rotation 57 Degrees    Left Shoulder External Rotation 79 Degrees      Strength   Overall Strength Within functional limits for tasks performed               LYMPHEDEMA/ONCOLOGY QUESTIONNAIRE - 05/10/21 0001       Type   Cancer Type Right breast cancer      Lymphedema Assessments   Lymphedema Assessments Upper extremities      Right Upper Extremity Lymphedema   10 cm Proximal to Olecranon Process 30.8 cm    Olecranon Process 26.8 cm    10 cm Proximal to Ulnar Styloid Process 27 cm    Just Proximal to Ulnar Styloid Process 17.5 cm    Across Hand at [employer] 20.2 cm    At [place] of 2nd Digit 6.3 cm      Left Upper Extremity Lymphedema   10 cm Proximal to Olecranon Process 30.5 cm    Olecranon Process 25.8 cm    10 cm Proximal to Ulnar Styloid Process 24.5 cm    Just Proximal to Ulnar Styloid Process 17.6 cm    Across Hand at [employer] 19.5 cm    At [place] of 2nd Digit 6.1 cm             L-DEX FLOWSHEETS - 05/10/21 1000       L-DEX LYMPHEDEMA SCREENING   Measurement Type Unilateral    L-DEX MEASUREMENT EXTREMITY Upper Extremity    POSITION  Standing    DOMINANT SIDE Right    At Risk Side Right    BASELINE SCORE (UNILATERAL) 1.7  Katina Dung - 05/10/21 0001     Open a tight or new jar Moderate difficulty    Do heavy household chores (wash walls, wash floors) Mild difficulty    Carry a shopping bag or briefcase No difficulty    Wash  your back No difficulty    Use a knife to cut food No difficulty    Recreational activities in which you take some force or impact through your arm, shoulder, or hand (golf, hammering, tennis) No difficulty    During the past week, to what extent has your arm, shoulder or hand problem interfered with your normal social activities with family, friends, neighbors, or groups? Not at all    During the past week, to what extent has your arm, shoulder or hand problem limited your work or other regular daily activities Slightly    Arm, shoulder, or hand pain. Mild    Tingling (pins and needles) in your arm, shoulder, or hand Mild    Difficulty Sleeping Mild difficulty    DASH Score 15.91 %              Objective measurements completed on examination: See above findings.      The patient was assessed using the L-Dex machine today to produce a lymphedema index baseline score. The patient will be reassessed on a regular basis (typically every 3 months) to obtain new L-Dex scores. If the score is > 6.5 points away from his/her baseline score indicating onset of subclinical lymphedema, it will be recommended to wear a compression garment for 4 weeks, 12 hours per day and then be reassessed. If the score continues to be > 6.5 points from baseline at reassessment, we will initiate lymphedema treatment. Assessing in this manner has a 95% rate of preventing clinically significant lymphedema.  Patient was instructed today in a home exercise program today for post op shoulder range of motion. These included active assist shoulder flexion in sitting, scapular retraction, wall walking with shoulder abduction, and hands behind head external rotation.  She was encouraged to do these twice a day, holding 3 seconds and repeating 5 times when permitted by her physician.           PT Education - 05/10/21 1035     Education Details Lymphedema risk reduction and post op shoulder ROM HEP    Person(s)  Educated Patient;Spouse    Methods Explanation;Demonstration;Handout    Comprehension Returned demonstration;Verbalized understanding                 PT Long Term Goals - 05/10/21 1055       PT LONG TERM GOAL #1   Title Patient will demonstrate she has regained full shoulder ROM and function post operatively compared to baselines.    Time 8    Period Weeks    Status New    Target Date 07/05/21             Breast Clinic Goals - 05/10/21 1055       Patient will be able to verbalize understanding of pertinent lymphedema risk reduction practices relevant to her diagnosis specifically related to skin care.   Time 1    Period Days    Status Achieved      Patient will be able to return demonstrate and/or verbalize understanding of the post-op home exercise program related to regaining shoulder range of motion.   Time 1    Period Days    Status Achieved      Patient  will be able to verbalize understanding of the importance of attending the postoperative After Breast Cancer Class for further lymphedema risk reduction education and therapeutic exercise.   Time 1    Period Days    Status Achieved                   Plan - 05/10/21 1047     Clinical Impression Statement Patient was diagnosed on 02/21/2021 with right invasive lobular carcinoma breast cancer. It measures 1.2 cm of calcifiations of DCIS and a 9 mm mass of invasive cancer in the upper outer quadrant. The DCIS is ER/PR positive and the mass had insufficient tissue to determine a prognostic panel which will be performed on final pathology. 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 Her multidisciplinary medical team met prior to her assessments to determine a recommended treatment plan. She is planning to have an MRI followed by Oncotype testing, radiation, and anti-estrogen therapy. She will benefit from a post op PT reassessment to determine needs and from L-Dex screens every 3  months for 2 years to detect subclinical lymphedema.    Stability/Clinical Decision Making Stable/Uncomplicated    Clinical Decision Making Low    Rehab Potential Excellent    PT Frequency --   Eval and 1 f/u visit   PT Treatment/Interventions ADLs/Self Care Home Management;Therapeutic exercise;Patient/family education    PT Next Visit Plan Reassess 3 weeks post op    PT Home Exercise Plan Post op HEP    Consulted and Agree with Plan of Care Patient;Family member/caregiver    Family Member Consulted Husband             Patient will benefit from skilled therapeutic intervention in order to improve the following deficits and impairments:  Postural dysfunction, Decreased range of motion, Decreased knowledge of precautions, Impaired UE functional use, Pain  Visit Diagnosis: Carcinoma of upper-outer quadrant of right female breast, unspecified estrogen receptor status ([place]) - Plan: PT plan of care cert/re-cert  Abnormal posture - Plan: PT plan of care cert/re-cert   Patient will follow up at outpatient cancer rehab 3-4 weeks following surgery.  If the patient requires physical therapy at that time, a specific plan will be dictated and sent to the referring physician for approval. The patient was educated today on appropriate basic range of motion exercises to begin post operatively and the importance of attending the After Breast Cancer class following surgery.  Patient was educated today on lymphedema risk reduction practices as it pertains to recommendations that will benefit the patient immediately following surgery.  She verbalized good understanding.     Problem List Patient Active Problem List   Diagnosis Date Noted   Malignant neoplasm of upper-outer quadrant of right female breast ([place]) 05/05/2021   Aortic atherosclerosis ([place]) 01/06/2019   Mixed hyperlipidemia 01/06/2019   Family history of arteriosclerotic cardiovascular disease 01/06/2019   Annia Friendly, PT 05/10/21  10:57 AM   [place], [place], [address] Phone: [phone]   Fax:  [phone]  Name: Kynsley Whitehouse MRN: [id number] Date of Birth: 1951/08/20

## 2021-05-10 NOTE — Patient Instructions (Signed)

## 2021-05-10 NOTE — Progress Notes (Signed)
[place] Work  Initial Assessment   Nichole Ritter is a 70 y.o. year old female presenting alone. Clinical Social Work was referred by [facility] for assessment of psychosocial needs.   SDOH (Social Determinants of Health) assessments performed: Yes SDOH Interventions    Flowsheet Row Most Recent Value  SDOH Interventions   Food Insecurity Interventions Intervention Not Indicated  Housing Interventions Intervention Not Indicated  Transportation Interventions Intervention Not Indicated       Distress Screen completed: Yes ONCBCN DISTRESS SCREENING 05/10/2021  Screening Type Initial Screening  Distress experienced in past week (1-10) 5  Practical problem type Insurance  Emotional problem type Nervousness/Anxiety;Adjusting to appearance changes  Physical Problem type Pain      Family/Social Information:  Housing Arrangement: patient lives with husband, Nichole Ritter . Son lives in [place] Family members/support persons in your life? 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 Just the unkown of how much insurance will cover Type of concern: None Food access concerns: no Religious or spiritual practice: yes Medication Concerns: no  Services Currently in place:  n/a  Coping/ Adjustment to diagnosis: Patient understands treatment plan and what happens next? yes Concerns about diagnosis and/or treatment:  General adjustment to diagnosis Patient reported stressors: Insurance Patient enjoys music and work, projects around the house Current coping skills/ strengths: Capable of independent living, Motivation for treatment/growth, Heritage manager, and Supportive family/friends    SUMMARY: Current SDOH Barriers:  No significant SDOH barriers at this time   Interventions: Discussed common feeling and emotions when being  diagnosed with cancer, and the importance of support during treatment Informed patient of the support team roles and support services at [facility] Provided [place] contact information and encouraged patient to call with any questions or concerns   Follow Up Plan: Patient will contact CSW with any support or resource needs Patient verbalizes understanding of plan: Yes    Nichole Ritter , LCSW

## 2021-05-12 ENCOUNTER — Telehealth: Payer: Self-pay | Admitting: Hematology and Oncology

## 2021-05-12 NOTE — Telephone Encounter (Signed)
Scheduled appointment per 07/15 sch msg. Patient is aware. 

## 2021-05-15 ENCOUNTER — Ambulatory Visit (HOSPITAL_COMMUNITY)
Admission: RE | Admit: 2021-05-15 | Discharge: 2021-05-15 | Disposition: A | Discharge status: Home / Self Care | Payer: Medicare Other | Source: Ambulatory Visit | Attending: Surgery | Admitting: Surgery

## 2021-05-15 ENCOUNTER — Other Ambulatory Visit: Payer: Self-pay

## 2021-05-15 DIAGNOSIS — C50411 Malignant neoplasm of upper-outer quadrant of right female breast: Secondary | ICD-10-CM | POA: Insufficient documentation

## 2021-05-15 DIAGNOSIS — N6489 Other specified disorders of breast: Secondary | ICD-10-CM | POA: Diagnosis not present

## 2021-05-15 MED ORDER — GADOBUTROL 1 MMOL/ML IV SOLN
9.0000 mL | Freq: Once | INTRAVENOUS | Status: AC | PRN
  Administered 2021-05-15: 9 mL via INTRAVENOUS

## 2021-05-16 ENCOUNTER — Encounter: Payer: Self-pay | Admitting: *Deleted

## 2021-05-16 ENCOUNTER — Telehealth: Payer: Self-pay | Admitting: *Deleted

## 2021-05-16 NOTE — Telephone Encounter (Signed)
Spoke with patient to follow up from [facility] 7/13 and assess navigation needs. Patient denies any needs or concerns at this time.  Encouraged her to call should anything arise.  Patient verbalized understanding.

## 2021-05-20 DIAGNOSIS — Z1379 Encounter for other screening for genetic and chromosomal anomalies: Secondary | ICD-10-CM | POA: Insufficient documentation

## 2021-05-22 ENCOUNTER — Encounter: Payer: Self-pay | Admitting: Genetic Counselor

## 2021-05-22 ENCOUNTER — Telehealth: Payer: Self-pay | Admitting: Genetic Counselor

## 2021-05-22 NOTE — Telephone Encounter (Signed)
Revealed negative genetic testing through the [facility] panel. Discussed that we do not know why she has breast cancer or why there is cancer in the family. There could be a genetic mutation in the family that Ms. Liberman did not inherit. There could also be a mutation in a different gene that we are not testing, or our current technology may not be able to detect certain mutations. It will therefore be important for her to stay in contact with genetics to keep up with whether additional testing may be appropriate in the future.   Additional testing through the [facility] CancerNext-Expanded + RNAinsight panel is pending. We will call her once these results are available.

## 2021-05-24 ENCOUNTER — Ambulatory Visit: Payer: Self-pay | Admitting: Genetic Counselor

## 2021-05-24 DIAGNOSIS — Z1379 Encounter for other screening for genetic and chromosomal anomalies: Secondary | ICD-10-CM

## 2021-05-24 NOTE — Telephone Encounter (Signed)
Revealed negative genetic testing through the [facility] CancerNext-Expanded + RNAinsight panel.  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 Her result is still considered normal at this time and should not impact her medical management.

## 2021-05-29 NOTE — Progress Notes (Signed)
HPI:  [name] was previously seen in the [place] clinic due to a personal and family history of cancer and concerns regarding a hereditary predisposition to cancer. Please refer to our prior cancer genetics clinic note for more information regarding our discussion, assessment and recommendations, at the time. [name] recent genetic test results were disclosed to her, as were recommendations warranted by these results. These results and recommendations are discussed in more detail below.  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 ER greater than 95%, PR 40%   05/10/2021 Cancer Staging   Staging form: Breast, AJCC 8th Edition - Clinical stage from 05/10/2021: cT1b, cN0, cM0, G1, ER+, PR+, HER2: Unknown - Signed by Nicholas Lose, MD on 05/10/2021  Stage prefix: Initial diagnosis  Histologic grading system: 3 grade system  Laterality: Right  Staged by: Pathologist and managing physician  Stage used in treatment planning: Yes  National guidelines used in treatment planning: Yes  Type of national guideline used in treatment planning: NCCN    05/20/2021 Genetic Testing   Negative genetic testing:  No pathogenic variants detected on the [facility] BRCAplus panel (report date 05/20/2021) or the CancerNext-Expanded + RNAinsight panel (report date 05/22/2021). 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  The BRCAplus panel offered by [employer] and includes sequencing and deletion/duplication analysis for the following 8 genes: ATM, BRCA1, BRCA2, CDH1, CHEK2,  PALB2, PTEN, and TP53. The CancerNext-Expanded + RNAinsight gene panel offered by [employer] and includes sequencing and rearrangement analysis for the following 77 genes: AIP, ALK, APC, ATM, AXIN2, BAP1, BARD1, BLM, BMPR1A, BRCA1, BRCA2, BRIP1, CDC73, CDH1, CDK4, CDKN1B, CDKN2A, CHEK2, CTNNA1, DICER1, FANCC, FH, FLCN, GALNT12, KIF1B, LZTR1, MAX, MEN1, MET, MLH1, MSH2, MSH3, MSH6, MUTYH, NBN, NF1, NF2, NTHL1, PALB2, PHOX2B, PMS2, POT1, PRKAR1A, PTCH1, PTEN, RAD51C, RAD51D, RB1, RECQL, RET, SDHA, SDHAF2, SDHB, SDHC, SDHD, SMAD4, SMARCA4, SMARCB1, SMARCE1, STK11, SUFU, TMEM127, TP53, TSC1, TSC2, VHL and XRCC2 (sequencing and deletion/duplication); EGFR, EGLN1, HOXB13, KIT, MITF, PDGFRA, POLD1 and POLE (sequencing only); EPCAM and GREM1 (deletion/duplication only). RNA data is routinely analyzed for use in variant interpretation for all genes.     FAMILY HISTORY:  We obtained a detailed, 4-generation family history.  Significant diagnoses are listed below: Family History  Problem Relation Age of Onset   Diabetes Mother    Hypertension Mother    Breast cancer Mother 16   Stroke Mother    Polycythemia Father        dx 56s   Lung cancer Sister 66       hx smoking   Leukemia Brother 3       acute   Prostate cancer Brother 44   Cancer Maternal Aunt        unknown type, dx >50   Heart attack Paternal Uncle    Cancer Cousin        multiple maternal cousins w/cancer - unknown types   Breast cancer Niece        dx 50s, metastatic   Ovarian cancer Niece        dx 30s/40s   Prostate cancer Nephew        dx  40s/50s  [name] has one son (age 42). She had seven brothers and three sisters. One sister (Nichole Ritter) had lung cancer at age 72. A brother (Nichole Ritter) had acute leukemia at age 68. Another brother (Nichole Ritter) had prostate cancer at age 87. A nephew had prostate cancer in his 40s or 50s, a niece has metastatic breast cancer diagnosed in her 50s, and another niece had ovarian cancer in her 30s or  40s.   [name]'s mother died at age 84 and had breast cancer at age 66. There were five maternal aunts and three maternal uncles. One aunt had an unknown cancer older than 50. Multiple maternal cousins have had cancer, types unknown. [name]'s maternal grandparents died older than 50 without cancer.   [name]'s father died at age 88 and had polycythemia vera diagnosed in his 70s. There were two paternal uncles. One paternal cousin may have had breast cancer. [name]'s paternal grandmother died in her 20s without cancer. Her paternal grandfather died at age 80 without cancer.   [name] is unaware of previous family history of genetic testing for hereditary cancer risks. Patient's maternal ancestors are of Irish descent, and paternal ancestors are of Irish and Scottish descent. There is no reported Ashkenazi Jewish ancestry. There is no known consanguinity.  GENETIC TEST RESULTS: Genetic testing reported out on 05/20/2021 through the [facility] BRCAplus panel and on 05/22/2021 through the [facility] CancerNext-Expanded + RNAinsight panel. No pathogenic variants were detected.   The BRCAplus panel offered by [facility] [employer] and includes sequencing and deletion/duplication analysis for the following 8 genes: ATM, BRCA1, BRCA2, CDH1, CHEK2, PALB2, PTEN, and TP53. The CancerNext-Expanded + RNAinsight gene panel offered by [facility] and includes sequencing and rearrangement analysis for the following 77 genes: AIP, ALK, APC, ATM, AXIN2, BAP1, BARD1, BLM, BMPR1A, BRCA1, BRCA2, BRIP1, CDC73, CDH1, CDK4, CDKN1B, CDKN2A, CHEK2, CTNNA1, DICER1, FANCC, FH, FLCN, GALNT12, KIF1B, LZTR1, MAX, MEN1, MET, MLH1, MSH2, MSH3, MSH6, MUTYH, NBN, NF1, NF2, NTHL1, PALB2, PHOX2B, PMS2, POT1, PRKAR1A, PTCH1, PTEN, RAD51C, RAD51D, RB1, RECQL, RET, SDHA, SDHAF2, SDHB, SDHC, SDHD, SMAD4, SMARCA4, SMARCB1, SMARCE1, STK11, SUFU, TMEM127, TP53, TSC1, TSC2, VHL and XRCC2 (sequencing and deletion/duplication); EGFR, EGLN1, HOXB13, KIT, MITF,  PDGFRA, POLD1 and POLE (sequencing only); EPCAM and GREM1 (deletion/duplication only). RNA data is routinely analyzed for use in variant interpretation for all genes. The test report will be scanned into EPIC and located under the Molecular Pathology section of the Results Review tab.  A portion of the result report is included below for reference.     We discussed with [name] that because current genetic testing is not perfect, it is possible there may be a gene mutation in one of these genes that current testing cannot detect, but that chance is small.  We also discussed that there could be another gene that has not yet been discovered, or that we have not yet tested, that is responsible for the cancer diagnoses in the family. It is also possible there is a hereditary cause for the cancer in the family that [name] did not inherit and therefore was not identified in her testing.  Therefore, it is important to remain in touch with cancer genetics in the future so that we can continue to offer [name] the most up to date genetic testing.   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 At this time, it is unknown if this variant is associated with increased   cancer risk or if this is a normal finding, but most variants such as this get reclassified to being inconsequential. It should not be used to make medical management decisions. With time, we suspect the lab will determine the significance of this variant, if any. If we do learn more about it, we will try to contact [name] to discuss it further. However, it is important to stay in touch with us periodically and keep the address and phone number up to date.  ADDITIONAL GENETIC TESTING:  We discussed with [name] that her genetic testing was fairly extensive.  If there are genes identified to increase cancer risk that can be analyzed in the future, we would be happy to discuss and  coordinate this testing at that time.    CANCER SCREENING RECOMMENDATIONS: [name] test result is considered negative (normal).  This means that we have not identified a hereditary cause for her personal and family history of cancer at this time. While reassuring, this does not definitively rule out a hereditary predisposition to cancer. It is still possible that there could be genetic mutations that are undetectable by current technology. There could be genetic mutations in genes that have not been tested or identified to increase cancer risk.  Therefore, it is recommended she continue to follow the cancer management and screening guidelines provided by her oncology and primary healthcare provider.   An individual's cancer risk and medical management are not determined by genetic test results alone. Overall cancer risk assessment incorporates additional factors, including personal medical history, family history, and any available genetic information that may result in a personalized plan for cancer prevention and surveillance.  RECOMMENDATIONS FOR FAMILY MEMBERS:  Individuals in this family might be at some increased risk of developing cancer, over the general population risk, simply due to the family history of cancer.  We recommended women in this family have a yearly mammogram beginning at age 40, or 10 years younger than the earliest onset of cancer, an annual clinical breast exam, and perform monthly breast self-exams. Women in this family should also have a gynecological exam as recommended by their primary provider. All family members should be referred for colonoscopy starting at age 45.   It is also possible there is a hereditary cause for the cancer in [name]'s family that she did not inherit and therefore was not identified in her.  Based on [name]'s family history, we recommended her relatives who have been diagnosed with prostate or breast cancer have genetic counseling and testing. Ms.  Nichole Ritter will let us know if we can be of any assistance in coordinating genetic counseling and/or testing for these family members.   FOLLOW-UP: Lastly, we discussed with [name] that cancer genetics is a rapidly advancing field and it is possible that new genetic tests will be appropriate for her and/or her family members in the future. We encouraged her to remain in contact with cancer genetics on an annual basis so we can update her personal and family histories and let her know of advances in cancer genetics that may benefit this family.   Our contact number was provided. [name]'s questions were answered to her satisfaction, and she knows she is welcome to call us at anytime with additional questions or concerns.   Emily Stiglich, MS, LCGC Genetic Counselor [email or web address] Phone: [phone]  

## 2021-06-07 ENCOUNTER — Encounter (HOSPITAL_BASED_OUTPATIENT_CLINIC_OR_DEPARTMENT_OTHER): Payer: Self-pay | Admitting: Surgery

## 2021-06-07 ENCOUNTER — Other Ambulatory Visit: Payer: Self-pay

## 2021-06-14 DIAGNOSIS — C50811 Malignant neoplasm of overlapping sites of right female breast: Secondary | ICD-10-CM | POA: Diagnosis not present

## 2021-06-14 NOTE — Progress Notes (Signed)

## 2021-06-15 ENCOUNTER — Ambulatory Visit (HOSPITAL_BASED_OUTPATIENT_CLINIC_OR_DEPARTMENT_OTHER): Payer: Medicare Other | Admitting: Certified Registered"

## 2021-06-15 ENCOUNTER — Ambulatory Visit (HOSPITAL_BASED_OUTPATIENT_CLINIC_OR_DEPARTMENT_OTHER)
Admission: RE | Admit: 2021-06-15 | Discharge: 2021-06-15 | Disposition: A | Discharge status: Home / Self Care | Payer: Medicare Other | Attending: Surgery | Admitting: Surgery

## 2021-06-15 ENCOUNTER — Encounter (HOSPITAL_BASED_OUTPATIENT_CLINIC_OR_DEPARTMENT_OTHER)
Admission: RE | Disposition: A | Discharge status: Home / Self Care | Payer: Self-pay | Source: Home / Self Care | Attending: Surgery

## 2021-06-15 ENCOUNTER — Encounter (HOSPITAL_BASED_OUTPATIENT_CLINIC_OR_DEPARTMENT_OTHER): Payer: Self-pay | Admitting: Surgery

## 2021-06-15 ENCOUNTER — Other Ambulatory Visit: Payer: Self-pay

## 2021-06-15 ENCOUNTER — Encounter (HOSPITAL_COMMUNITY)
Admission: RE | Admit: 2021-06-15 | Discharge: 2021-06-15 | Disposition: A | Discharge status: Home / Self Care | Payer: Medicare Other | Source: Ambulatory Visit | Attending: Surgery | Admitting: Surgery

## 2021-06-15 DIAGNOSIS — C50811 Malignant neoplasm of overlapping sites of right female breast: Secondary | ICD-10-CM | POA: Diagnosis not present

## 2021-06-15 DIAGNOSIS — C50411 Malignant neoplasm of upper-outer quadrant of right female breast: Secondary | ICD-10-CM | POA: Diagnosis not present

## 2021-06-15 DIAGNOSIS — C50911 Malignant neoplasm of unspecified site of right female breast: Secondary | ICD-10-CM

## 2021-06-15 DIAGNOSIS — Z170 Estrogen receptor positive status [ER+]: Secondary | ICD-10-CM | POA: Insufficient documentation

## 2021-06-15 DIAGNOSIS — N641 Fat necrosis of breast: Secondary | ICD-10-CM | POA: Diagnosis not present

## 2021-06-15 DIAGNOSIS — E559 Vitamin D deficiency, unspecified: Secondary | ICD-10-CM | POA: Diagnosis not present

## 2021-06-15 DIAGNOSIS — E782 Mixed hyperlipidemia: Secondary | ICD-10-CM | POA: Diagnosis not present

## 2021-06-15 HISTORY — DX: Barrett's esophagus without dysplasia: K22.70

## 2021-06-15 HISTORY — DX: Unspecified osteoarthritis, unspecified site: M19.90

## 2021-06-15 HISTORY — PX: BREAST LUMPECTOMY WITH RADIOACTIVE SEED AND SENTINEL LYMPH NODE BIOPSY: SHX6550

## 2021-06-15 HISTORY — DX: Malignant (primary) neoplasm, unspecified: C80.1

## 2021-06-15 SURGERY — BREAST LUMPECTOMY WITH RADIOACTIVE SEED AND SENTINEL LYMPH NODE BIOPSY
Anesthesia: General | Site: Breast | Laterality: Right

## 2021-06-15 MED ORDER — HYDROMORPHONE HCL 1 MG/ML IJ SOLN
INTRAMUSCULAR | Status: AC
  Filled 2021-06-15: qty 0.5

## 2021-06-15 MED ORDER — FENTANYL CITRATE (PF) 100 MCG/2ML IJ SOLN
INTRAMUSCULAR | Status: AC
  Filled 2021-06-15: qty 2

## 2021-06-15 MED ORDER — PROMETHAZINE HCL 25 MG/ML IJ SOLN: 6.2500 mg | INTRAMUSCULAR | Status: DC | PRN

## 2021-06-15 MED ORDER — CHLORHEXIDINE GLUCONATE CLOTH 2 % EX PADS: 6.0000 | MEDICATED_PAD | Freq: Once | CUTANEOUS | Status: DC

## 2021-06-15 MED ORDER — LIDOCAINE HCL (PF) 2 % IJ SOLN
INTRAMUSCULAR | Status: AC
  Filled 2021-06-15: qty 5

## 2021-06-15 MED ORDER — MIDAZOLAM HCL 2 MG/2ML IJ SOLN
INTRAMUSCULAR | Status: AC
  Filled 2021-06-15: qty 2

## 2021-06-15 MED ORDER — OXYCODONE HCL 5 MG/5ML PO SOLN
5.0000 mg | Freq: Once | ORAL | Status: DC | PRN
Start: 2021-06-15 — End: 2021-06-15

## 2021-06-15 MED ORDER — IBUPROFEN 800 MG PO TABS: 800.0000 mg | ORAL_TABLET | Freq: Three times a day (TID) | ORAL | 0 refills | Status: DC | PRN

## 2021-06-15 MED ORDER — SODIUM CHLORIDE 0.9 % IV SOLN
INTRAVENOUS | Status: DC | PRN
  Administered 2021-06-15: 500 mL

## 2021-06-15 MED ORDER — DEXAMETHASONE SODIUM PHOSPHATE 10 MG/ML IJ SOLN
INTRAMUSCULAR | Status: AC
  Filled 2021-06-15: qty 1

## 2021-06-15 MED ORDER — SODIUM CHLORIDE 0.9 % IV SOLN
INTRAVENOUS | Status: AC
  Filled 2021-06-15: qty 10

## 2021-06-15 MED ORDER — ONDANSETRON HCL 4 MG/2ML IJ SOLN
INTRAMUSCULAR | Status: AC
  Filled 2021-06-15: qty 2

## 2021-06-15 MED ORDER — 0.9 % SODIUM CHLORIDE (POUR BTL) OPTIME
TOPICAL | Status: DC | PRN
  Administered 2021-06-15: 200 mL

## 2021-06-15 MED ORDER — NON FORMULARY
Status: DC | PRN
  Administered 2021-06-15: 2 mL

## 2021-06-15 MED ORDER — ONDANSETRON HCL 4 MG/2ML IJ SOLN
INTRAMUSCULAR | Status: DC | PRN
  Administered 2021-06-15: 4 mg via INTRAVENOUS

## 2021-06-15 MED ORDER — DIPHENHYDRAMINE HCL 50 MG/ML IJ SOLN
INTRAMUSCULAR | Status: DC | PRN
  Administered 2021-06-15: 25 mg via INTRAVENOUS

## 2021-06-15 MED ORDER — OXYCODONE HCL 5 MG PO TABS: 5.0000 mg | ORAL_TABLET | Freq: Four times a day (QID) | ORAL | 0 refills | Status: DC | PRN

## 2021-06-15 MED ORDER — HYDROMORPHONE HCL 1 MG/ML IJ SOLN
0.2500 mg | INTRAMUSCULAR | Status: DC | PRN
  Administered 2021-06-15: 0.5 mg via INTRAVENOUS
  Administered 2021-06-15 (×2): 0.25 mg via INTRAVENOUS

## 2021-06-15 MED ORDER — AMISULPRIDE (ANTIEMETIC) 5 MG/2ML IV SOLN: 10.0000 mg | Freq: Once | INTRAVENOUS | Status: DC | PRN

## 2021-06-15 MED ORDER — VANCOMYCIN HCL 500 MG IV SOLR
INTRAVENOUS | Status: AC
  Filled 2021-06-15: qty 10

## 2021-06-15 MED ORDER — VANCOMYCIN HCL 500 MG IV SOLR
INTRAVENOUS | Status: DC | PRN
  Administered 2021-06-15: 500 mg via TOPICAL

## 2021-06-15 MED ORDER — OXYCODONE HCL 5 MG PO TABS: 5.0000 mg | ORAL_TABLET | Freq: Once | ORAL | Status: DC | PRN

## 2021-06-15 MED ORDER — EPHEDRINE SULFATE 50 MG/ML IJ SOLN
INTRAMUSCULAR | Status: DC | PRN
  Administered 2021-06-15 (×3): 10 mg via INTRAVENOUS

## 2021-06-15 MED ORDER — PROPOFOL 10 MG/ML IV BOLUS
INTRAVENOUS | Status: DC | PRN
  Administered 2021-06-15: 50 mg via INTRAVENOUS
  Administered 2021-06-15: 30 mg via INTRAVENOUS
  Administered 2021-06-15: 120 mg via INTRAVENOUS

## 2021-06-15 MED ORDER — ACETAMINOPHEN 500 MG PO TABS
ORAL_TABLET | ORAL | Status: AC
  Filled 2021-06-15: qty 2

## 2021-06-15 MED ORDER — TECHNETIUM TC 99M TILMANOCEPT KIT
1.0000 | PACK | Freq: Once | INTRAVENOUS | Status: AC | PRN
  Administered 2021-06-15: 1 via INTRADERMAL

## 2021-06-15 MED ORDER — CEFAZOLIN SODIUM-DEXTROSE 2-4 GM/100ML-% IV SOLN
INTRAVENOUS | Status: AC
  Filled 2021-06-15: qty 100

## 2021-06-15 MED ORDER — FENTANYL CITRATE (PF) 100 MCG/2ML IJ SOLN
INTRAMUSCULAR | Status: DC | PRN
  Administered 2021-06-15: 100 ug via INTRAVENOUS
  Administered 2021-06-15 (×4): 25 ug via INTRAVENOUS
  Administered 2021-06-15: 50 ug via INTRAVENOUS
  Administered 2021-06-15 (×2): 25 ug via INTRAVENOUS

## 2021-06-15 MED ORDER — LACTATED RINGERS IV SOLN: INTRAVENOUS | Status: DC

## 2021-06-15 MED ORDER — BUPIVACAINE-EPINEPHRINE (PF) 0.25% -1:200000 IJ SOLN
INTRAMUSCULAR | Status: DC | PRN
  Administered 2021-06-15: 10 mL

## 2021-06-15 MED ORDER — PROPOFOL 10 MG/ML IV BOLUS
INTRAVENOUS | Status: AC
  Filled 2021-06-15: qty 20

## 2021-06-15 MED ORDER — PREDNISONE 10 MG PO TABS: 20.0000 mg | ORAL_TABLET | Freq: Every day | ORAL | 0 refills | Status: DC

## 2021-06-15 MED ORDER — ACETAMINOPHEN 500 MG PO TABS
1000.0000 mg | ORAL_TABLET | ORAL | Status: AC
  Administered 2021-06-15: 1000 mg via ORAL

## 2021-06-15 MED ORDER — DEXAMETHASONE SODIUM PHOSPHATE 4 MG/ML IJ SOLN
INTRAMUSCULAR | Status: DC | PRN
  Administered 2021-06-15: 8 mg via INTRAVENOUS

## 2021-06-15 MED ORDER — CEFAZOLIN IN SODIUM CHLORIDE 3-0.9 GM/100ML-% IV SOLN
3.0000 g | INTRAVENOUS | Status: AC
  Administered 2021-06-15: 2 g via INTRAVENOUS

## 2021-06-15 MED ORDER — GLYCOPYRROLATE 0.2 MG/ML IJ SOLN
INTRAMUSCULAR | Status: DC | PRN
  Administered 2021-06-15: .2 mg via INTRAVENOUS

## 2021-06-15 SURGICAL SUPPLY — 52 items
ADH SKN CLS APL DERMABOND .7 (GAUZE/BANDAGES/DRESSINGS) ×1
APL PRP STRL LF DISP 70% ISPRP (MISCELLANEOUS) ×1
APPLIER CLIP 9.375 MED OPEN (MISCELLANEOUS) ×2
APR CLP MED 9.3 20 MLT OPN (MISCELLANEOUS) ×1
BINDER BREAST LRG (GAUZE/BANDAGES/DRESSINGS) IMPLANT
BINDER BREAST MEDIUM (GAUZE/BANDAGES/DRESSINGS) IMPLANT
BINDER BREAST XLRG (GAUZE/BANDAGES/DRESSINGS) ×1 IMPLANT
BINDER BREAST XXLRG (GAUZE/BANDAGES/DRESSINGS) IMPLANT
BLADE SURG 15 STRL LF DISP TIS (BLADE) ×1 IMPLANT
BLADE SURG 15 STRL SS (BLADE) ×2
CANISTER SUC SOCK COL 7IN (MISCELLANEOUS) IMPLANT
CANISTER SUCT 1200ML W/VALVE (MISCELLANEOUS) ×2 IMPLANT
CHLORAPREP W/TINT 26 (MISCELLANEOUS) ×2 IMPLANT
CLIP APPLIE 9.375 MED OPEN (MISCELLANEOUS) ×1 IMPLANT
COVER BACK TABLE 60X90IN (DRAPES) ×2 IMPLANT
COVER MAYO STAND STRL (DRAPES) ×2 IMPLANT
COVER PROBE W GEL 5X96 (DRAPES) ×3 IMPLANT
DECANTER SPIKE VIAL GLASS SM (MISCELLANEOUS) IMPLANT
DERMABOND ADVANCED (GAUZE/BANDAGES/DRESSINGS) ×1
DERMABOND ADVANCED .7 DNX12 (GAUZE/BANDAGES/DRESSINGS) ×1 IMPLANT
DRAPE LAPAROSCOPIC ABDOMINAL (DRAPES) ×2 IMPLANT
DRAPE UTILITY XL STRL (DRAPES) ×2 IMPLANT
ELECT COATED BLADE 2.86 ST (ELECTRODE) ×2 IMPLANT
ELECT REM PT RETURN 9FT ADLT (ELECTROSURGICAL) ×2
ELECTRODE REM PT RTRN 9FT ADLT (ELECTROSURGICAL) ×1 IMPLANT
GLOVE SRG 8 PF TXTR STRL LF DI (GLOVE) ×1 IMPLANT
GLOVE SURG LTX SZ8 (GLOVE) ×2 IMPLANT
GLOVE SURG UNDER POLY LF SZ8 (GLOVE) ×2
GOWN STRL REUS W/ TWL LRG LVL3 (GOWN DISPOSABLE) ×2 IMPLANT
GOWN STRL REUS W/ TWL XL LVL3 (GOWN DISPOSABLE) ×1 IMPLANT
GOWN STRL REUS W/TWL LRG LVL3 (GOWN DISPOSABLE) ×4
GOWN STRL REUS W/TWL XL LVL3 (GOWN DISPOSABLE) ×2
HEMOSTAT ARISTA ABSORB 3G PWDR (HEMOSTASIS) ×2 IMPLANT
HEMOSTAT SNOW SURGICEL 2X4 (HEMOSTASIS) IMPLANT
KIT MARKER MARGIN INK (KITS) ×2 IMPLANT
NDL HYPO 25X1 1.5 SAFETY (NEEDLE) ×1 IMPLANT
NDL SAFETY ECLIPSE 18X1.5 (NEEDLE) IMPLANT
NEEDLE HYPO 18GX1.5 SHARP (NEEDLE) ×2
NEEDLE HYPO 25X1 1.5 SAFETY (NEEDLE) ×4 IMPLANT
NS IRRIG 1000ML POUR BTL (IV SOLUTION) ×1 IMPLANT
PACK BASIN DAY SURGERY FS (CUSTOM PROCEDURE TRAY) ×2 IMPLANT
PENCIL SMOKE EVACUATOR (MISCELLANEOUS) ×2 IMPLANT
SLEEVE SCD COMPRESS KNEE MED (STOCKING) ×2 IMPLANT
SPONGE T-LAP 4X18 ~~LOC~~+RFID (SPONGE) ×3 IMPLANT
SUT MNCRL AB 4-0 PS2 18 (SUTURE) ×2 IMPLANT
SUT VICRYL 3-0 CR8 SH (SUTURE) ×2 IMPLANT
SYR CONTROL 10ML LL (SYRINGE) ×3 IMPLANT
TOWEL GREEN STERILE FF (TOWEL DISPOSABLE) ×2 IMPLANT
TRACER MAGTRACE VIAL (MISCELLANEOUS) ×1 IMPLANT
TRAY FAXITRON CT DISP (TRAY / TRAY PROCEDURE) ×3 IMPLANT
TUBE CONNECTING 20X1/4 (TUBING) ×2 IMPLANT
YANKAUER SUCT BULB TIP NO VENT (SUCTIONS) ×2 IMPLANT

## 2021-06-15 NOTE — Anesthesia Procedure Notes (Signed)
Procedure Name: LMA Insertion Date/Time: 06/15/2021 7:41 AM Performed by: Ezequiel Kayser, CRNA Pre-anesthesia Checklist: Patient identified, Emergency Drugs available, Suction available and Patient being monitored Patient Re-evaluated:Patient Re-evaluated prior to induction Oxygen Delivery Method: Circle System Utilized Preoxygenation: Pre-oxygenation with 100% oxygen Induction Type: IV induction Ventilation: Mask ventilation without difficulty LMA: LMA inserted LMA Size: 4.0 Number of attempts: 1 Airway Equipment and Method: Bite block Placement Confirmation: positive ETCO2 Tube secured with: Tape Dental Injury: Teeth and Oropharynx as per pre-operative assessment

## 2021-06-15 NOTE — Transfer of Care (Signed)
Immediate Anesthesia Transfer of Care Note  Patient: Nichole Ritter  Procedure(s) Performed: RIGHT BREAST LUMPECTOMY WITH RADIOACTIVE SEED X 2 AND SENTINEL LYMPH NODE BIOPSY (Right: Breast)  Patient Location: PACU  Anesthesia Type:General  Level of Consciousness: drowsy  Airway & Oxygen Therapy: Patient Spontanous Breathing and Patient connected to face mask oxygen  Post-op Assessment: Report given to RN, Post -op Vital signs reviewed and stable and Patient moving all extremities X 4  Post vital signs: Reviewed and stable  Last Vitals:  Vitals Value Taken Time  BP 141/73 06/15/21 0945  Temp    Pulse 70 06/15/21 0949  Resp 25 06/15/21 0950  SpO2 99 % 06/15/21 0949  Vitals shown include unvalidated device data.  Last Pain:  Vitals:   06/15/21 0644  TempSrc: Oral  PainSc: 0-No pain         Complications: No notable events documented.

## 2021-06-15 NOTE — Discharge Instructions (Addendum)
[facility] Office Phone Number [phone]  BREAST BIOPSY/ PARTIAL MASTECTOMY: POST OP INSTRUCTIONS  Always review your discharge instruction sheet given to you by the facility where your surgery was performed.  IF YOU HAVE DISABILITY OR FAMILY LEAVE FORMS, YOU MUST BRING THEM TO THE OFFICE FOR PROCESSING.  DO NOT GIVE THEM TO YOUR DOCTOR.  A prescription for pain medication may be given to you upon discharge.  Take your pain medication as prescribed, if needed.  If narcotic pain medicine is not needed, then you may take acetaminophen (Tylenol) or ibuprofen (Advil) as needed. No Tylenol until after 1:00pm today. Take your usually prescribed medications unless otherwise directed If you need a refill on your pain medication, please contact your pharmacy.  They will contact our office to request authorization.  Prescriptions will not be filled after 5pm or on week-ends. You should eat very light the first 24 hours after surgery, such as soup, crackers, pudding, etc.  Resume your normal diet the day after surgery. Most patients will experience some swelling and bruising in the breast.  Ice packs and a good support bra will help.  Swelling and bruising can take several days to resolve.  It is common to experience some constipation if taking pain medication after surgery.  Increasing fluid intake and taking a stool softener will usually help or prevent this problem from occurring.  A mild laxative (Milk of Magnesia or Miralax) should be taken according to package directions if there are no bowel movements after 48 hours. Unless discharge instructions indicate otherwise, you may remove your bandages 24-48 hours after surgery, and you may shower at that time.  You may have steri-strips (small skin tapes) in place directly over the incision.  These strips should be left on the skin for 7-10 days.  If your surgeon used skin glue on the incision, you may shower in 24 hours.  The glue will flake  off over the next 2-3 weeks.  Any sutures or staples will be removed at the office during your follow-up visit. ACTIVITIES:  You may resume regular daily activities (gradually increasing) beginning the next day.  Wearing a good support bra or sports bra minimizes pain and swelling.  You may have sexual intercourse when it is comfortable. You may drive when you no longer are taking prescription pain medication, you can comfortably wear a seatbelt, and you can safely maneuver your car and apply brakes. RETURN TO WORK:  ______________________________________________________________________________________ You should see your doctor in the office for a follow-up appointment approximately two weeks after your surgery.  Your doctor's nurse will typically make your follow-up appointment when she calls you with your pathology report.  Expect your pathology report 2-3 business days after your surgery.  You may call to check if you do not hear from us after three days. OTHER INSTRUCTIONS: _______________________________________________________________________________________________ _____________________________________________________________________________________________________________________________________ _____________________________________________________________________________________________________________________________________ _____________________________________________________________________________________________________________________________________  WHEN TO CALL YOUR DOCTOR: Fever over 101.0 Nausea and/or vomiting. Extreme swelling or bruising. Continued bleeding from incision. Increased pain, redness, or drainage from the incision.  The clinic staff is available to answer your questions during regular business hours.  Please don't hesitate to call and ask to speak to one of the nurses for clinical concerns.  If you have a medical emergency, go to the nearest emergency room or call  911.  A surgeon from [facility] is always on call at the hospital.  For further questions, please visit [email or web address]    Post Anesthesia Home Care Instructions  Activity: Get plenty   of rest for the remainder of the day. A responsible individual must stay with you for 24 hours following the procedure.  For the next 24 hours, DO NOT: -Drive a car -Operate machinery -Drink alcoholic beverages -Take any medication unless instructed by your physician -Make any legal decisions or sign important papers.  Meals: Start with liquid foods such as gelatin or soup. Progress to regular foods as tolerated. Avoid greasy, spicy, heavy foods. If nausea and/or vomiting occur, drink only clear liquids until the nausea and/or vomiting subsides. Call your physician if vomiting continues.  Special Instructions/Symptoms: Your throat may feel dry or sore from the anesthesia or the breathing tube placed in your throat during surgery. If this causes discomfort, gargle with warm salt water. The discomfort should disappear within 24 hours.  If you had a scopolamine patch placed behind your ear for the management of post- operative nausea and/or vomiting:  1. The medication in the patch is effective for 72 hours, after which it should be removed.  Wrap patch in a tissue and discard in the trash. Wash hands thoroughly with soap and water. 2. You may remove the patch earlier than 72 hours if you experience unpleasant side effects which may include dry mouth, dizziness or visual disturbances. 3. Avoid touching the patch. Wash your hands with soap and water after contact with the patch.     

## 2021-06-15 NOTE — Interval H&P Note (Signed)
History and Physical Interval Note:  06/15/2021 7:22 AM  Nichole Ritter  has presented today for surgery, with the diagnosis of RIGHT BREAST CANCER.  The various methods of treatment have been discussed with the patient and family. After consideration of risks, benefits and other options for treatment, the patient has consented to  Procedure(s): RIGHT BREAST LUMPECTOMY WITH RADIOACTIVE SEED X 2 AND SENTINEL LYMPH NODE BIOPSY (Right) as a surgical intervention.  The patient's history has been reviewed, patient examined, no change in status, stable for surgery.  I have reviewed the patient's chart and labs.  Questions were answered to the patient's satisfaction.     [place]

## 2021-06-15 NOTE — Anesthesia Postprocedure Evaluation (Signed)
Anesthesia Post Note  Patient: Nichole Ritter  Procedure(s) Performed: RIGHT BREAST LUMPECTOMY WITH RADIOACTIVE SEED X 2 AND SENTINEL LYMPH NODE BIOPSY (Right: Breast)     Patient location during evaluation: PACU Anesthesia Type: General Level of consciousness: awake and alert Pain management: pain level controlled Vital Signs Assessment: post-procedure vital signs reviewed and stable Respiratory status: spontaneous breathing, nonlabored ventilation and respiratory function stable Cardiovascular status: blood pressure returned to baseline and stable Postop Assessment: no apparent nausea or vomiting Anesthetic complications: no   No notable events documented.  Last Vitals:  Vitals:   06/15/21 1030 06/15/21 1044  BP: 122/64 129/69  Pulse: 62 62  Resp: 13 16  Temp:  36.6 C  SpO2: 99% 95%    Last Pain:  Vitals:   06/15/21 1044  TempSrc:   PainSc: [name]

## 2021-06-15 NOTE — Op Note (Signed)
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  Right axillary sentinel node x2 hot  Drains: None  IV fluids: Per anesthesia record  Indications for procedure: Patient presents with multifocal right breast cancer overlapping sites.  She presents for breast conserving surgery.  She will require 2 lumpectomies.  We discussed sentinel lymph node mapping.  Risk, benefits and long-term expectations of surgery were discussed as well as postoperative treatments.  Breast conserving surgery versus mastectomy discussed as well.We discussed the staging and pathophysiology of breast cancer. We discussed all of the different options for treatment for breast cancer including surgery, chemotherapy, radiation therapy, Herceptin, and antiestrogen therapy. We discussed a sentinel lymph node biopsy as she does not appear to having lymph node involvement right now. We discussed the performance of that with injection of radioactive tracer. We discussed that there is a chance of having a positive node with a sentinel lymph node biopsy and we will await the permanent pathology to make any other first further decisions in terms of her treatment. We discussed up to a 5% risk lifetime of chronic shoulder pain as well as lymphedema associated with a sentinel lymph node biopsy. We discussed the options for treatment of the breast cancer which included lumpectomy versus a mastectomy. We discussed the performance of the lumpectomy with radioactive seed placement. We discussed a 5-10% chance of a positive margin requiring reexcision in the operating room. We also discussed that she will likely  need radiation therapy if she undergoes lumpectomy.  We discussed mastectomy and the postoperative care for that as well. Mastectomy can be followed by reconstruction. The decision for lumpectomy vs mastectomy has no impact on decision for chemotherapy. Most mastectomy patients will not need radiation therapy. We discussed that there is no difference in her survival whether she undergoes lumpectomy with radiation therapy or antiestrogen therapy versus a mastectomy. There is also no real difference between her recurrence in the breast. We discussed the risks of operation including bleeding, infection, possible reoperation. She understands her further therapy will be based on what her stages at the time of her operation.    The procedure has been discussed with the patient. Alternatives to surgery have been discussed with the patient.  Risks of surgery include bleeding,  Infection,  Seroma formation, death,  and the need for further surgery.   The patient understands and wishes to proceed. Sentinel lymph node mapping and dissection has been discussed with the patient.  Risk of bleeding,  Infection, arm swelling  Seroma formation,  Additional procedures,,  Shoulder weakness ,  Shoulder stiffness,  Nerve and blood vessel injury and reaction to the mapping dyes have been discussed.  Alternatives to surgery have been discussed with the patient.  The patient agrees to proceed.    Description of procedure: Patient met in the holding area and questions answered.  Right side was marked as correct site.  Neoprobe used to identify both seeds in the breast.  She underwent injection with Lymphoseek by radiology per protocol.  She was then taken back to the abdomen.  Block was not administered due to her allergy to lidocaine.  She reports she does better with smaller doses and we talked about using  local at both lumpectomy sites and the sentinel node site to reduce her reaction which is primarily itching.  We discussed not  using it and pain control issues but she understood the need to use this for pain control was appropriate with that conversation.  We discussed use of prednisone postop to hopefully reduce her itching as well.  When she was taken back, she was placed supine.  General anesthesia initiated.  Right breast and right arm were prepped and draped in sterile fashion timeout performed.  We then injected 2 cc of mag trace in a subareolar position and massaged for 5 minutes.  We then prepped and draped in the usual fashion.  We used the mag tracer probe to identify the sentinel node in the right axilla and I verified that with the neoprobe.  Lumpectomy was done first.  There were 2 areas 1 in the right upper central breast and the other which overlapped in the right lateral breast.  I opted to use 2 incisions given the amount of distance between the 2 lesions.  The superior lesion was done first.  A transverse incision was made in the upper central breast.  Dissection was carried down and all tissue around the seed and clip were excised using cautery.  Margins were grossly negative.  The cavities made hemostatic with cautery and irrigated.  A moist lap was placed into this cavity until a second modality could be performed.  A similar fashion a curvilinear incision was made along the lateral border of the nipple areolar complex on the right.  Dissection was carried down all tissue and the seed and clip were excised to grossly negative margin.  The cavity was irrigated.  Is made hemostatic with cautery.  I then infiltrated about 8 cc of local in each lumpectomy site carefully.  After irrigation ensure hemostasis, Arista was placed.  Clips were placed to mark the cavity.  Both cavities were closed with a deep layer 3-0 Vicryl and 4 Monocryl to approximate the skin.  Neoprobe was used to identify hotspot in the right axilla.  The mag trace probe was also used as well and both correlated well.  A transverse incision was made  along the inferior axillary hairline of about 4 cm.  Dissection was carried down through the subcutaneous fat into the level 1 axillary lymph node basin on the right.  Using both tracers I was able to identify sentinel node which is level 1 node.  This was excised.  We then used both probes to assess background counts.  These were quite low and both correlated well.  This was felt to be concordant and no other sentinel nodes were removed at this point time since no more were identified using both tracers.  The long thoracic nerve, thoracodorsal trunk and the axillary vein were all identified and preserved.  Hemostasis achieved with cautery.  Irrigation was used and suctioned out.  Arista was placed as well as vancomycin powder.  Wound closed with deep layer 3-0 Vicryl and a 4-0 Monocryl stitch in the skin was used.  Dermabond applied to all 3 incisions.  Breast binder placed.  All counts were found to be correct.  The patient was awoke extubated taken to recovery in satisfactory condition.  All specimens of no were oriented with ink and sent to pathology.

## 2021-06-15 NOTE — Anesthesia Preprocedure Evaluation (Signed)
Anesthesia Evaluation  Patient identified by MRN, date of birth, ID band Patient awake    Reviewed: Allergy & Precautions, NPO status , Patient's Chart, lab work & pertinent test results  Airway Mallampati: II  TM Distance: >3 FB Neck ROM: Full    Dental no notable dental hx.    Pulmonary neg pulmonary ROS,    Pulmonary exam normal breath sounds clear to auscultation       Cardiovascular negative cardio ROS Normal cardiovascular exam Rhythm:Regular Rate:Normal     Neuro/Psych negative neurological ROS  negative psych ROS   GI/Hepatic Neg liver ROS, GERD  ,  Endo/Other  negative endocrine ROS  Renal/GU negative Renal ROS  negative genitourinary   Musculoskeletal  (+) Arthritis , Osteoarthritis,    Abdominal (+) + obese,   Peds negative pediatric ROS (+)  Hematology negative hematology ROS (+)   Anesthesia Other Findings Breast Cancer  Reproductive/Obstetrics negative OB ROS                             Anesthesia Physical Anesthesia Plan  ASA: 3  Anesthesia Plan: General   Post-op Pain Management:    Induction: Intravenous  PONV Risk Score and Plan: 3 and Ondansetron, Dexamethasone, Midazolam and Treatment may vary due to age or medical condition  Airway Management Planned: LMA  Additional Equipment:   Intra-op Plan:   Post-operative Plan: Extubation in OR  Informed Consent: I have reviewed the patients History and Physical, chart, labs and discussed the procedure including the risks, benefits and alternatives for the proposed anesthesia with the patient or authorized representative who has indicated his/her understanding and acceptance.     Dental advisory given  Plan Discussed with: CRNA  Anesthesia Plan Comments:         Anesthesia Quick Evaluation

## 2021-06-15 NOTE — H&P (Signed)
Subjective   Chief Complaint: right breast cancer   History of Present Illness: Nichole Ritter is a 70 y.o. female who is seen today as an office consultation at the request of Dr. Lindi Adie for evaluation of right breast cancer.   Patient presents the [facility] for evaluation of right breast cancer. She was noted to have an area of density in the right breast in the upper outer and central locations. 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 The invasive lobular carcinoma markers were not able to be assessed due to small sample size. No obvious lymphadenopathy noted. Denies any history of breast pain nipple discharge or breast mass  Review of Systems: A complete review of systems was obtained from the patient. I have reviewed this information and discussed as appropriate with the patient. See HPI as well for other ROS.  Review of Systems  Constitutional: Negative.  HENT: Negative.  Eyes: Negative.  Respiratory: Negative.  Cardiovascular: Negative.  Genitourinary: Negative.  Musculoskeletal: Negative.  Skin: Negative.    Medical History: History reviewed. No pertinent past medical history.  There is no problem list on file for this patient.  History reviewed. No pertinent surgical history.   Not on File  No current outpatient medications on file prior to visit.   No current facility-administered medications on file prior to visit.   Family History  Problem Relation Age of Onset   No Known Problems Mother    Social History   Tobacco Use  Smoking Status Never Smoker  Smokeless Tobacco Not on file    Social History   Socioeconomic History   Marital status: Married  Tobacco Use   Smoking status: Never Smoker  Substance and Sexual Activity   Alcohol use: Not Currently   Drug use: Never   Objective:   There were no vitals filed for this visit.  There is no height or weight on file to calculate BMI.  Physical  Exam Constitutional:  Appearance: Normal appearance.  HENT:  Head: Normocephalic.  Nose: Nose normal.  Eyes:  Extraocular Movements: Extraocular movements intact.  Pupils: Pupils are equal, round, and reactive to light.  Cardiovascular:  Rate and Rhythm: Normal rate and regular rhythm.  Pulmonary:  Effort: Pulmonary effort is normal.  Breath sounds: Normal breath sounds.  Chest:  Breasts:  Right: No axillary adenopathy or supraclavicular adenopathy.  Left: No axillary adenopathy or supraclavicular adenopathy.   Abdominal:  General: Abdomen is flat.  Palpations: Abdomen is soft.  Musculoskeletal:  General: Normal range of motion.  Cervical back: Normal range of motion and neck supple.  Lymphadenopathy:  Upper Body:  Right upper body: No supraclavicular or axillary adenopathy.  Left upper body: No supraclavicular, axillary or pectoral adenopathy.  Skin: General: Skin is warm and dry.  Neurological:  General: No focal deficit present.  Mental Status: She is alert.  Psychiatric:  Mood and Affect: Mood normal.  Behavior: Behavior normal.     Labs, Imaging and Diagnostic Testing: See aboive  Assessment and Plan:  Diagnoses and all orders for this visit:  Malignant neoplasm of upper-outer quadrant of right breast in female, estrogen receptor positive (CMS-HCC)    Multifocal right breast cancer. 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  Recommend MRI  Breast conserving surgery if able. Recommend lumpectomy   Recommend sentinel lymph node mapping. Medical/  radiation oncology to follow. Discussed breast conserving surgery mastectomy. She would like to try to conserve her  breast. Risk of bleeding, infection, seroma, cosmetic deformity, lymphedema, pain, numbness, need for other treatments and/or surgeries discussed with the patient.  No follow-ups on file.  Kennieth Francois, MD   I spent a total of 40 minutes in both  face-to-face and non-face-to-face activities for this visit on the date of this encounter.

## 2021-06-16 ENCOUNTER — Encounter (HOSPITAL_BASED_OUTPATIENT_CLINIC_OR_DEPARTMENT_OTHER): Payer: Self-pay | Admitting: Surgery

## 2021-06-20 ENCOUNTER — Encounter: Payer: Self-pay | Admitting: Surgery

## 2021-06-21 ENCOUNTER — Telehealth: Payer: Self-pay | Admitting: *Deleted

## 2021-06-21 ENCOUNTER — Encounter: Payer: Self-pay | Admitting: *Deleted

## 2021-06-21 LAB — SURGICAL PATHOLOGY

## 2021-06-21 NOTE — Telephone Encounter (Signed)
Ordered oncotype per Dr. Gudena. Faxed requisition to pathology. °

## 2021-06-22 NOTE — Progress Notes (Signed)
Patient Care Team: [name], MD as PCP - General (Family Medicine) Jerline Pain, MD as PCP - Cardiology (Cardiology) Mauro Kaufmann, RN as Oncology Nurse Navigator Rockwell Germany, RN as Oncology Nurse Navigator Erroll Luna, MD as Consulting Physician (General Surgery) Nicholas Lose, MD as Consulting Physician (Hematology and Oncology) Kyung Rudd, MD as Consulting Physician (Radiation Oncology)  DIAGNOSIS:    ICD-10-CM   1. Malignant neoplasm of upper-outer quadrant of right breast in female, estrogen receptor positive ([place])  C50.411    Z17.0       SUMMARY OF ONCOLOGIC HISTORY: Oncology History  Malignant neoplasm of upper-outer quadrant of right female breast (Nichole Ritter)  04/24/2021 Initial Diagnosis   Screening mammogram detected architectural distortion 9:00 measuring 0.9 cm biopsy revealed invasive mammary cancer with lobular features prognostic panel pending, right breast calcifications at 12:00 1.2 cm, biopsy showed DCIS with LCIS high-grade with necrosis and calcifications ER greater than 95%, PR 40%   05/10/2021 Cancer Staging   Staging form: Breast, AJCC 8th Edition - Clinical stage from 05/10/2021: cT1b, cN0, cM0, G1, ER+, PR+, HER2: Unknown - Signed by Nicholas Lose, MD on 05/10/2021 Stage prefix: Initial diagnosis Histologic grading system: 3 grade system Laterality: Right Staged by: Pathologist and managing physician Stage used in treatment planning: Yes National guidelines used in treatment planning: Yes Type of national guideline used in treatment planning: NCCN   05/20/2021 Genetic Testing   Negative genetic testing:  No pathogenic variants detected on the [employer] BRCAplus panel (report date 05/20/2021) or the CancerNext-Expanded + RNAinsight panel (report date 05/22/2021). 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  The BRCAplus panel offered by [employer] and includes sequencing and deletion/duplication  analysis for the following 8 genes: ATM, BRCA1, BRCA2, CDH1, CHEK2, PALB2, PTEN, and TP53. The CancerNext-Expanded + RNAinsight gene panel offered by [employer] and includes sequencing and rearrangement analysis for the following 77 genes: AIP, ALK, APC, ATM, AXIN2, BAP1, BARD1, BLM, BMPR1A, BRCA1, BRCA2, BRIP1, CDC73, CDH1, CDK4, CDKN1B, CDKN2A, CHEK2, CTNNA1, DICER1, FANCC, FH, FLCN, GALNT12, KIF1B, LZTR1, MAX, MEN1, MET, MLH1, MSH2, MSH3, MSH6, MUTYH, NBN, NF1, NF2, NTHL1, PALB2, PHOX2B, PMS2, POT1, PRKAR1A, PTCH1, PTEN, RAD51C, RAD51D, RB1, RECQL, RET, SDHA, SDHAF2, SDHB, SDHC, SDHD, SMAD4, SMARCA4, SMARCB1, SMARCE1, STK11, SUFU, TMEM127, TP53, TSC1, TSC2, VHL and XRCC2 (sequencing and deletion/duplication); EGFR, EGLN1, HOXB13, KIT, MITF, PDGFRA, POLD1 and POLE (sequencing only); EPCAM and GREM1 (deletion/duplication only). RNA data is routinely analyzed for use in variant interpretation for all genes.     CHIEF COMPLIANT: Follow-up of right breast cancer  INTERVAL HISTORY: Nichole Ritter is a 70 y.o. with above-mentioned history of right breast cancer. Right breast lumpectomy on 06/15/21 high-grade DCIS with necrosis and calcifications, lobular carcinoma in situ, grade 2 invasive lobular carcinoma with resection margins negative for carcinoma and right axillary lymph node negative for carcinoma. She presents to the clinic today for follow-up.   ALLERGIES:  is allergic to caine-1 [lidocaine], esomeprazole magnesium, formaldehyde, and nickel.  MEDICATIONS:  Current Outpatient Medications  Medication Sig Dispense Refill   atorvastatin (LIPITOR) 10 MG tablet Take 10 mg by mouth daily.     B Complex-C (SUPER B COMPLEX PO) Take 1 tablet by mouth daily.      betamethasone dipropionate (DIPROLENE) 0.05 % cream Apply 1 application topically 2 (two) times daily as needed.      Calcium-Magnesium-Vitamin D (CALCIUM 1200+D3 PO) Take 1 tablet by mouth daily.      Cholecalciferol (  VITAMIN D3) 50 MCG (2000  UT) capsule Take 2,000 Units by mouth daily.     cycloSPORINE (RESTASIS) 0.05 % ophthalmic emulsion 1 drop 2 (two) times daily.     fluticasone (FLONASE) 50 MCG/ACT nasal spray Place into both nostrils daily.     ibuprofen (ADVIL) 800 MG tablet Take 1 tablet (800 mg total) by mouth every 8 (eight) hours as needed. 30 tablet 0   LUTEIN PO Take by mouth.     Multiple Vitamins-Minerals (CENTRUM SILVER 50+WOMEN PO) Take 1 tablet by mouth daily.      OMEPRAZOLE PO Take 20 mg by mouth.     oxyCODONE (OXY IR/ROXICODONE) 5 MG immediate release tablet Take 1 tablet (5 mg total) by mouth every 6 (six) hours as needed for severe pain. 15 tablet 0   predniSONE (DELTASONE) 10 MG tablet Take 2 tablets (20 mg total) by mouth daily. 14 tablet 0   zinc gluconate 50 MG tablet Take 50 mg by mouth daily.     No current facility-administered medications for this visit.    PHYSICAL EXAMINATION: ECOG PERFORMANCE STATUS: 1 - Symptomatic but completely ambulatory  Vitals:   06/23/21 1118  BP: (!) 155/64  Pulse: (!) 58  Resp: 18  Temp: (!) 97.2 F (36.2 C)  SpO2: 98%   Filed Weights   06/23/21 1118  Weight: 190 lb 4.8 oz (86.3 kg)    BREAST: No palpable masses or nodules in either right or left breasts. No palpable axillary supraclavicular or infraclavicular adenopathy no breast tenderness or nipple discharge. (exam performed in the presence of a chaperone)  LABORATORY DATA:  I have reviewed the data as listed CMP Latest Ref Rng & Units 05/10/2021 12/02/2018  Glucose 70 - 99 mg/dL 96 -  BUN 8 - 23 mg/dL 10 -  Creatinine 0.44 - 1.00 mg/dL 0.81 -  Sodium 135 - 145 mmol/L 142 -  Potassium 3.5 - 5.1 mmol/L 4.0 -  Chloride 98 - 111 mmol/L 105 -  CO2 22 - 32 mmol/L 28 -  Calcium 8.9 - 10.3 mg/dL 9.5 -  Total Protein 6.5 - 8.1 g/dL 7.4 -  Total Bilirubin 0.3 - 1.2 mg/dL 0.8 -  Alkaline Phos 38 - 126 U/L 81 -  AST 15 - 41 U/L 21 -  ALT 0 - 44 U/L 19 22    Lab Results  Component Value Date   WBC 6.6  05/10/2021   HGB 15.4 (H) 05/10/2021   HCT 44.5 05/10/2021   MCV 90.6 05/10/2021   PLT 326 05/10/2021   NEUTROABS 3.4 05/10/2021    ASSESSMENT & PLAN:  Malignant neoplasm of upper-outer quadrant of right female breast ([place]) 04/24/2021: Screening mammogram detected architectural distortion 9:00 measuring 0.9 cm biopsy revealed invasive mammary cancer with lobular features prognostic panel pending, right breast calcifications at 12:00 1.2 cm, biopsy showed DCIS with LCIS high-grade with necrosis and calcifications ER greater than 95%, PR 40%  06/15/2021: Right superior lumpectomy: High-grade DCIS with necrosis, LCIS, margins negative Right lateral lumpectomy: Grade 2 invasive lobular carcinoma, 1.3 cm, margins negative, focal ADH, ALH, 0/1 lymph node negative, ER 80%, PR 60%, Ki-67 1%, HER2 negative 1+  Pathology counseling: I discussed the final pathology report of the patient provided  a copy of this report. I discussed the margins as well as lymph node surgeries. We also discussed the final staging along with previously performed ER/PR and HER-2/neu testing.  Treatment plan: 1. Oncotype DX testing to determine if chemotherapy would be of any  benefit followed by 2. Adjuvant radiation therapy followed by 3. Adjuvant antiestrogen therapy   Return to clinic based on Oncotype test result     No orders of the defined types were placed in this encounter.  The patient has a good understanding of the overall plan. she agrees with it. she will call with any problems that may develop before the next visit here.  Total time spent: 20 mins including face to face time and time spent for planning, charting and coordination of care  Rulon Eisenmenger, MD, MPH 06/23/2021  I, Thana Ates, am acting as scribe for Dr. Nicholas Lose.  I have reviewed the above documentation for accuracy and completeness, and I agree with the above.

## 2021-06-23 ENCOUNTER — Other Ambulatory Visit: Payer: Self-pay

## 2021-06-23 ENCOUNTER — Inpatient Hospital Stay: Payer: Medicare Other | Attending: Hematology and Oncology | Admitting: Hematology and Oncology

## 2021-06-23 DIAGNOSIS — Z79899 Other long term (current) drug therapy: Secondary | ICD-10-CM | POA: Insufficient documentation

## 2021-06-23 DIAGNOSIS — N641 Fat necrosis of breast: Secondary | ICD-10-CM | POA: Diagnosis not present

## 2021-06-23 DIAGNOSIS — C50411 Malignant neoplasm of upper-outer quadrant of right female breast: Secondary | ICD-10-CM | POA: Diagnosis not present

## 2021-06-23 DIAGNOSIS — Z170 Estrogen receptor positive status [ER+]: Secondary | ICD-10-CM | POA: Insufficient documentation

## 2021-06-23 NOTE — Assessment & Plan Note (Signed)
04/24/2021: Screening mammogram detected architectural distortion 9:00 measuring 0.9 cm biopsy revealed invasive mammary cancer with lobular features prognostic panel pending, right breast calcifications at 12:00 1.2 cm, biopsy showed DCIS with LCIS high-grade with necrosis and calcifications ER greater than 95%, PR 40%  06/15/2021: Right superior lumpectomy: High-grade DCIS with necrosis, LCIS, margins negative Right lateral lumpectomy: Grade 2 invasive lobular carcinoma, 1.3 cm, margins negative, focal ADH, ALH, 0/1 lymph node negative, ER 80%, PR 60%, Ki-67 1%, HER2 negative 1+  Pathology counseling: I discussed the final pathology report of the patient provided  a copy of this report. I discussed the margins as well as lymph node surgeries. We also discussed the final staging along with previously performed ER/PR and HER-2/neu testing.  Treatment plan: 1. Oncotype DX testing to determine if chemotherapy would be of any benefit followed by 2. Adjuvant radiation therapy followed by 3. Adjuvant antiestrogen therapy   Return to clinic based on Oncotype test result

## 2021-06-30 DIAGNOSIS — C50411 Malignant neoplasm of upper-outer quadrant of right female breast: Secondary | ICD-10-CM | POA: Diagnosis not present

## 2021-06-30 DIAGNOSIS — Z170 Estrogen receptor positive status [ER+]: Secondary | ICD-10-CM | POA: Diagnosis not present

## 2021-07-06 ENCOUNTER — Other Ambulatory Visit: Payer: Self-pay

## 2021-07-06 ENCOUNTER — Ambulatory Visit: Payer: Medicare Other | Attending: Surgery | Admitting: Physical Therapy

## 2021-07-06 ENCOUNTER — Encounter: Payer: Self-pay | Admitting: Physical Therapy

## 2021-07-06 ENCOUNTER — Encounter: Payer: Self-pay | Admitting: Hematology and Oncology

## 2021-07-06 DIAGNOSIS — R293 Abnormal posture: Secondary | ICD-10-CM | POA: Diagnosis not present

## 2021-07-06 DIAGNOSIS — Z483 Aftercare following surgery for neoplasm: Secondary | ICD-10-CM | POA: Insufficient documentation

## 2021-07-06 DIAGNOSIS — R600 Localized edema: Secondary | ICD-10-CM | POA: Diagnosis not present

## 2021-07-06 DIAGNOSIS — C50411 Malignant neoplasm of upper-outer quadrant of right female breast: Secondary | ICD-10-CM | POA: Insufficient documentation

## 2021-07-06 NOTE — Therapy (Signed)
[place], [place], [address] Phone: [phone]   Fax:  [phone]  Physical Therapy Treatment  Patient Details  Name: Nichole Ritter MRN: [id number] Date of Birth: 1951/08/22 Referring Provider (PT): Dr. Erroll Luna   Encounter Date: 07/06/2021   PT End of Session - 07/06/21 1201     Visit Number 2    Number of Visits 2    PT Start Time 1102    PT Stop Time 1140    PT Time Calculation (min) 38 min    Activity Tolerance Patient tolerated treatment well    Behavior During Therapy [facility] for tasks assessed/performed             Past Medical History:  Diagnosis Date   Allergic rhinitis    Aortic atherosclerosis ([place])    Arthritis    Barrett esophagus    Barrett's esophagus    Bunion    Cancer ([place])    right breast [facility]   Family history of breast cancer    Family history of leukemia    Family history of lung cancer    Family history of ovarian cancer    Family history of prostate cancer    Female cystocele    GERD (gastroesophageal reflux disease)    History of epistaxis 2012   Hyperlipidemia    UNSPECIFIED   Lichen sclerosus    Obesity    Osteopenia    Personal history of colonic polyps    Rectocele    Situational stress    Stress incontinence    Vitamin D deficiency     Past Surgical History:  Procedure Laterality Date   BREAST LUMPECTOMY     BREAST LUMPECTOMY WITH RADIOACTIVE SEED AND SENTINEL LYMPH NODE BIOPSY Right 06/15/2021   Procedure: RIGHT BREAST LUMPECTOMY WITH RADIOACTIVE SEED X 2 AND SENTINEL LYMPH NODE BIOPSY;  Surgeon: Erroll Luna, MD;  Location: [facility];  Service: General;  Laterality: Right;   CATARACT EXTRACTION Bilateral 02/2018   03/2018   COLONOSCOPY  01/2013   LUMBAR DISC SURGERY     TUBAL LIGATION      There were no vitals filed for this visit.   Subjective Assessment - 07/06/21 1104     Subjective Patient reports she underwent a  right lumpectomy and sentinel node biopsy (1 negative node) on 06/15/2021. She is awaiting for Oncotype results and will undergo radiation at minimum. She had a possible infection in her breast and is on her 2nd course of antibiotics now.    Pertinent History Patient was diagnosed on 02/21/2021 with right invasive lobular carcinoma breast cancer. She underwent a right lumpectomy and sentinel node biopsy (1 negative node) on 06/15/2021. 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    Patient Stated Goals SEe how my arm is doing    Currently in Pain? Yes    Pain Score 4     Pain Location Breast    Pain Orientation Right    Pain Descriptors / Indicators Sore    Pain Type Surgical pain    Pain Onset 1 to 4 weeks ago    Pain Frequency Intermittent    Aggravating Factors  rolling over in bed    Pain Relieving Factors medication                [facility] PT Assessment - 07/06/21 0001       Assessment   Medical Diagnosis s/p right lumpectomy and SLNB  Referring Provider (PT) Dr. Marcello Moores [place]    Onset Date/Surgical Date 06/15/21    Hand Dominance Right    Prior Therapy Baselines      Precautions   Precautions Other (comment)    Precaution Comments recent cancer surgery; lumbar HNP; right arm lymphedema risk      Restrictions   Weight Bearing Restrictions No      Balance Screen   Has the patient fallen in the past 6 months No    Has the patient had a decrease in activity level because of a fear of falling?  No    Is the patient reluctant to leave their home because of a fear of falling?  No      Home Ecologist residence    Living Arrangements Spouse/significant other    Available Help at Discharge Family      Prior Function   Level of [place] Retired    Leisure She does a HEP for her back but no other exercise and she is walking 5x/week for 20 minutes      Cognition   Overall Cognitive Status  Within Functional Limits for tasks assessed      Observation/Other Assessments   Observations Left rbeast with 2 inisions from 2 lumpectomies. Superior incision is slightly open with active healing. Mild ozzing of yellow thick fluid; covered with non-adherent pad. Mild edema present in left breast. Axillary incision appears to be well healed. Mild redness present around superior breast incision.      Posture/Postural Control   Posture/Postural Control Postural limitations    Postural Limitations Rounded Shoulders;Forward head      ROM / Strength   AROM / PROM / Strength AROM      AROM   AROM Assessment Site Shoulder    Right/Left Shoulder Right    Right Shoulder Extension 56 Degrees    Right Shoulder Flexion 135 Degrees    Right Shoulder ABduction 161 Degrees    Right Shoulder Internal Rotation 61 Degrees    Right Shoulder External Rotation 80 Degrees               LYMPHEDEMA/ONCOLOGY QUESTIONNAIRE - 07/06/21 0001       Type   Cancer Type Right breast cancer      Surgeries   Lumpectomy Date 06/15/21    Sentinel Lymph Node Biopsy Date 06/15/21    Number Lymph Nodes Removed 1      Treatment   Active Chemotherapy Treatment No    Past Chemotherapy Treatment No    Active Radiation Treatment No    Past Radiation Treatment No    Current Hormone Treatment No    Past Hormone Therapy No      What other symptoms do you have   Are you Having Heaviness or Tightness Yes    Are you having Pain Yes    Are you having pitting edema No    Is it Hard or Difficult finding clothes that fit No    Do you have infections Yes    Comments Began antibiotics 06/26/2021 and started 2nd course of antibiotics 07/05/2021    Is there Decreased scar mobility Yes    [place] Sign No      Lymphedema Assessments   Lymphedema Assessments Upper extremities      Right Upper Extremity Lymphedema   10 cm Proximal to Olecranon Process 30.8 cm    Olecranon Process 26.4 cm    10 cm Proximal  to Ulnar  Styloid Process 25 cm    Just Proximal to Ulnar Styloid Process 17.8 cm    Across Hand at [employer] 19.5 cm    At [place] of 2nd Digit 6.3 cm      Left Upper Extremity Lymphedema   10 cm Proximal to Olecranon Process 30.6 cm    Olecranon Process 25.8 cm    10 cm Proximal to Ulnar Styloid Process 23.8 cm    Just Proximal to Ulnar Styloid Process 17 cm    Across Hand at [employer] 18.5 cm    At [place] of 2nd Digit 5.8 cm                Quick Dash - 07/06/21 0001     Open a tight or new jar Mild difficulty    Do heavy household chores (wash walls, wash floors) Mild difficulty    Carry a shopping bag or briefcase No difficulty    Wash your back No difficulty    Use a knife to cut food No difficulty    Recreational activities in which you take some force or impact through your arm, shoulder, or hand (golf, hammering, tennis) Mild difficulty    During the past week, to what extent has your arm, shoulder or hand problem interfered with your normal social activities with family, friends, neighbors, or groups? Slightly    During the past week, to what extent has your arm, shoulder or hand problem limited your work or other regular daily activities Slightly    Arm, shoulder, or hand pain. Mild    Tingling (pins and needles) in your arm, shoulder, or hand None    Difficulty Sleeping Mild difficulty    DASH Score 15.91 %                             PT Education - 07/06/21 1201     Education Details Aftercare; scar massage; HEP    Person(s) Educated Patient    Methods Explanation;Demonstration;Handout    Comprehension Returned demonstration;Verbalized understanding                 PT Long Term Goals - 07/06/21 1204       PT LONG TERM GOAL #1   Title Patient will demonstrate she has regained full shoulder ROM and function post operatively compared to baselines.    Time 8    Period Weeks    Status Achieved                   Plan -  07/06/21 1202     Clinical Impression Statement Patient is doing well s/p right double lumpectomy and sentinel node biopsy on 06/15/2021. Her 1 node was negative. Her only complication is a possible infection and wound healing difficulties in her right superior breast incision with mild oozing. Her shoulder ROM is back to baseline, there is no sign of lymphedema, and she is independent with hre home exercises and is walking regularly. She will participate in the After Breast Cancer class but otherwise has no PT needs at this time.    PT Treatment/Interventions ADLs/Self Care Home Management;Therapeutic exercise;Patient/family education    PT Next Visit Plan D/C    PT Home Exercise Plan Post op HEP    Consulted and Agree with Plan of Care Patient             Patient will benefit from skilled therapeutic  intervention in order to improve the following deficits and impairments:  Postural dysfunction, Decreased range of motion, Decreased knowledge of precautions, Impaired UE functional use, Pain  Visit Diagnosis: Carcinoma of upper-outer quadrant of right female breast, unspecified estrogen receptor status ([place])  Abnormal posture  Aftercare following surgery for neoplasm     Problem List Patient Active Problem List   Diagnosis Date Noted   Genetic testing 05/20/2021   Family history of prostate cancer 05/10/2021   Family history of breast cancer 05/10/2021   Family history of ovarian cancer 05/10/2021   Family history of leukemia 05/10/2021   Family history of lung cancer 05/10/2021   Malignant neoplasm of upper-outer quadrant of right female breast ([place]) 05/05/2021   Aortic atherosclerosis ([place]) 01/06/2019   Mixed hyperlipidemia 01/06/2019   Family history of arteriosclerotic cardiovascular disease 01/06/2019   PHYSICAL THERAPY DISCHARGE SUMMARY  Visits from Start of Care: 2  Current functional level related to goals / functional outcomes: Goals met. See above for objective  measurements.   Remaining deficits: None   Education / Equipment: Lymphedema risk reduction and HEP  Patient agrees to discharge. Patient goals were met. Patient is being discharged due to meeting the stated rehab goals.  Annia Friendly, [place] 07/06/21 12:06 PM    [place], [place], [address] Phone: [phone]   Fax:  [phone]  Name: Nichole Ritter MRN: [id number] Date of Birth: 1951-06-29

## 2021-07-06 NOTE — Patient Instructions (Addendum)
            [facility]         [address], [place] [address]         [phone]         Annia Friendly, PT, CLT   After Breast Cancer Class It is recommended you attend the ABC class to be educated on lymphedema risk reduction. This class is free of charge and lasts for 1 hour. It is a 1-time class.  You are scheduled for October 17th at 11:00. We will send you a link to Webex.  Scar massage You can begin gently scar massage using coconut oil once your incisions are completely healed (wait until you see Dr. Brantley Stage on 07/25/2021 to make sure he thinks it looks healed.  Compression garment This can help promote healing and reduce swelling if you wear a sports/compression bra.  Home exercise Program Continue doing the exercises until you complete radiation.  Follow up PT: It is recommended you return every 3 months for the first 3 years following surgery to be assessed on the SOZO machine for an L-Dex score. This helps prevent clinically significant lymphedema in 95% of patients. These follow up screens are 10 minute appointments that you are not billed for. WE ARE SCHEDULED TO MOVE TO [place] July 31, 2021. APPOINTMENTS FOR SOZO SCREENS AFTER 07/31/2021 WILL BE LOCATED AT [facility] AT [address] [place] [address]. Please call us to confirm we have moved if your appointment is scheduled after October 3rd, 2022. The phone number is [phone]. You are scheduled for November 28th at 8:30 am.

## 2021-07-07 ENCOUNTER — Telehealth: Payer: Self-pay | Admitting: *Deleted

## 2021-07-07 ENCOUNTER — Encounter: Payer: Self-pay | Admitting: *Deleted

## 2021-07-07 DIAGNOSIS — C50411 Malignant neoplasm of upper-outer quadrant of right female breast: Secondary | ICD-10-CM

## 2021-07-07 DIAGNOSIS — Z170 Estrogen receptor positive status [ER+]: Secondary | ICD-10-CM

## 2021-07-07 NOTE — Telephone Encounter (Signed)
Received oncoytpe of 19/6%. Patient is aware. Referral placed for Dr. Lisbeth Renshaw.

## 2021-07-12 ENCOUNTER — Encounter (HOSPITAL_COMMUNITY): Payer: Self-pay

## 2021-07-13 ENCOUNTER — Encounter: Payer: Self-pay | Admitting: *Deleted

## 2021-07-18 ENCOUNTER — Ambulatory Visit
Admission: RE | Admit: 2021-07-18 | Discharge: 2021-07-18 | Disposition: A | Discharge status: Home / Self Care | Payer: Medicare Other | Source: Ambulatory Visit | Attending: Radiation Oncology | Admitting: Radiation Oncology

## 2021-07-18 ENCOUNTER — Encounter: Payer: Self-pay | Admitting: Radiation Oncology

## 2021-07-18 ENCOUNTER — Other Ambulatory Visit: Payer: Self-pay

## 2021-07-18 VITALS — BP 134/77 | HR 72 | Temp 97.3°F | Resp 18 | Ht 63.0 in | Wt 189.4 lb

## 2021-07-18 DIAGNOSIS — C50411 Malignant neoplasm of upper-outer quadrant of right female breast: Secondary | ICD-10-CM | POA: Diagnosis not present

## 2021-07-18 DIAGNOSIS — Z170 Estrogen receptor positive status [ER+]: Secondary | ICD-10-CM | POA: Diagnosis not present

## 2021-07-18 DIAGNOSIS — Z86010 Personal history of colonic polyps: Secondary | ICD-10-CM | POA: Diagnosis not present

## 2021-07-18 DIAGNOSIS — Z510 Encounter for antineoplastic radiation therapy: Secondary | ICD-10-CM | POA: Diagnosis not present

## 2021-07-18 DIAGNOSIS — E785 Hyperlipidemia, unspecified: Secondary | ICD-10-CM | POA: Diagnosis not present

## 2021-07-18 DIAGNOSIS — E669 Obesity, unspecified: Secondary | ICD-10-CM | POA: Diagnosis not present

## 2021-07-18 DIAGNOSIS — Z79899 Other long term (current) drug therapy: Secondary | ICD-10-CM | POA: Diagnosis not present

## 2021-07-18 DIAGNOSIS — I700 Atherosclerosis of aorta: Secondary | ICD-10-CM | POA: Diagnosis not present

## 2021-07-18 DIAGNOSIS — M8580 Other specified disorders of bone density and structure, unspecified site: Secondary | ICD-10-CM | POA: Diagnosis not present

## 2021-07-18 DIAGNOSIS — K219 Gastro-esophageal reflux disease without esophagitis: Secondary | ICD-10-CM | POA: Insufficient documentation

## 2021-07-18 DIAGNOSIS — Z8042 Family history of malignant neoplasm of prostate: Secondary | ICD-10-CM | POA: Insufficient documentation

## 2021-07-18 DIAGNOSIS — Z8041 Family history of malignant neoplasm of ovary: Secondary | ICD-10-CM | POA: Diagnosis not present

## 2021-07-18 DIAGNOSIS — E559 Vitamin D deficiency, unspecified: Secondary | ICD-10-CM | POA: Insufficient documentation

## 2021-07-18 DIAGNOSIS — Z806 Family history of leukemia: Secondary | ICD-10-CM | POA: Insufficient documentation

## 2021-07-18 DIAGNOSIS — Z803 Family history of malignant neoplasm of breast: Secondary | ICD-10-CM | POA: Diagnosis not present

## 2021-07-18 DIAGNOSIS — Z801 Family history of malignant neoplasm of trachea, bronchus and lung: Secondary | ICD-10-CM | POA: Insufficient documentation

## 2021-07-18 NOTE — Progress Notes (Signed)
Patient reports mild skin redness, swelling, w/ lumps to RT breast, tenderness, and full range of motion bilaterally. No other symptoms to reports at this time.  Meaningful use complete.   BP 134/77 (BP Location: Left Arm, Patient Position: Sitting, Cuff Size: Normal)   Pulse 72   Temp (!) 97.3 F (36.3 C) (Temporal)   Resp 18   Ht 5\' 3"  (1.6 m)   Wt 189 lb 6.4 oz (85.9 kg)   SpO2 98%   BMI 33.55 kg/m

## 2021-07-18 NOTE — Progress Notes (Signed)
Radiation Oncology         [phone] ________________________________  Name: Nichole Ritter        MRN: [id number]  Date of Service: 07/18/2021 DOB: [birth year]  CC:Nichole Stains, MD  Nichole Lose, MD     REFERRING PHYSICIAN: Nicholas Lose, MD   DIAGNOSIS: The encounter diagnosis was Malignant neoplasm of upper-outer quadrant of right breast in female, estrogen receptor positive ([place]).   HISTORY OF PRESENT ILLNESS: Nichole Ritter is a 70 y.o. female originally seen in the multidisciplinary breast clinic for a new diagnosis of right breast cancer. The patient was noted to have screening detected architectural distorion and calcifications in the right breast. Her disortion was at 9:00 and had no ultrasound correlate but the calcifications spanned 1.2 cm in the 12:00 position. The axilla was negative for adenopathy. She underwent stereotactic biopsy on 04/24/21 that revealed an invasive lobular carcinoma in the 9:00 biopsy there was not a grade given in her report, and insufficient tissue was present for prognostic markers. Biopsies of the 12:00 calcifications were consistent with high grade DCIS with calcifications necrosis, and ALH. This biopsy was ER/PR positive.    Since her last visit, the patient underwent right lumpectomy x2 and sentinel lymph node biopsy on 06/15/2021.  Final pathology revealed high-grade DCIS with necrosis and calcifications with LCIS in the superior resection.  The margins were negative.  The second lateral specimen revealed an invasive lobular carcinoma measuring 1.3 cm that was felt to be grade 2 margins were also negative and focal atypical ductal hyperplasia and lobular hyperplasia were seen.  Additional lateral and anterior medial margins were negative for disease and a single lymph node was negative for disease as well.  Oncotype scoring revealed a score of 19 and Dr. Lindi Adie does not recommend any chemotherapy.  She is seen today to discuss adjuvant  radiotherapy.   PREVIOUS RADIATION THERAPY: No   PAST MEDICAL HISTORY:  Past Medical History:  Diagnosis Date   Allergic rhinitis    Aortic atherosclerosis ([facility])    Arthritis    Barrett esophagus    Barrett's esophagus    Bunion    Cancer ([facility])    right breast [facility]   Family history of breast cancer    Family history of leukemia    Family history of lung cancer    Family history of ovarian cancer    Family history of prostate cancer    Female cystocele    GERD (gastroesophageal reflux disease)    History of epistaxis 2012   Hyperlipidemia    UNSPECIFIED   Lichen sclerosus    Obesity    Osteopenia    Personal history of colonic polyps    Rectocele    Situational stress    Stress incontinence    Vitamin D deficiency        PAST SURGICAL HISTORY: Past Surgical History:  Procedure Laterality Date   BREAST LUMPECTOMY     BREAST LUMPECTOMY WITH RADIOACTIVE SEED AND SENTINEL LYMPH NODE BIOPSY Right 06/15/2021   Procedure: RIGHT BREAST LUMPECTOMY WITH RADIOACTIVE SEED X 2 AND SENTINEL LYMPH NODE BIOPSY;  Surgeon: Erroll Luna, MD;  Location: [facility];  Service: General;  Laterality: Right;   CATARACT EXTRACTION Bilateral 02/2018   03/2018   COLONOSCOPY  01/2013   LUMBAR DISC SURGERY     TUBAL LIGATION       FAMILY HISTORY:  Family History  Problem Relation Age of Onset   Diabetes Mother    Hypertension Mother  Breast cancer Mother 91   Stroke Mother    Polycythemia Father        dx 33s   Lung cancer Sister 49       hx smoking   Leukemia Brother 55       acute   Prostate cancer Brother 52   Cancer Maternal Aunt        unknown type, dx >50   Heart attack Paternal Uncle    Cancer Cousin        multiple maternal cousins w/cancer - unknown types   Breast cancer Niece        dx 83s, metastatic   Ovarian cancer Niece        dx 30s/40s   Prostate cancer Nephew        dx 40s/50s     SOCIAL HISTORY:  reports that she has never smoked.  She has never used smokeless tobacco. She reports current alcohol use. She reports that she does not use drugs. The patient is married and lives in [place]. She is a Theme park manager. She is active and enjoys spending time with her 37 year old grand son.    ALLERGIES: Caine-1 [lidocaine], Esomeprazole magnesium, Formaldehyde, and Nickel   MEDICATIONS:  Current Outpatient Medications  Medication Sig Dispense Refill   atorvastatin (LIPITOR) 10 MG tablet Take 10 mg by mouth daily.     B Complex-C (SUPER B COMPLEX PO) Take 1 tablet by mouth daily.      betamethasone dipropionate (DIPROLENE) 0.05 % cream Apply 1 application topically 2 (two) times daily as needed.      Calcium-Magnesium-Vitamin D (CALCIUM 1200+D3 PO) Take 1 tablet by mouth daily.      Cholecalciferol (VITAMIN D3) 50 MCG (2000 UT) capsule Take 2,000 Units by mouth daily.     cycloSPORINE (RESTASIS) 0.05 % ophthalmic emulsion 1 drop 2 (two) times daily.     fluticasone (FLONASE) 50 MCG/ACT nasal spray Place into both nostrils daily.     ibuprofen (ADVIL) 800 MG tablet Take 1 tablet (800 mg total) by mouth every 8 (eight) hours as needed. 30 tablet 0   LUTEIN PO Take by mouth.     Multiple Vitamins-Minerals (CENTRUM SILVER 50+WOMEN PO) Take 1 tablet by mouth daily.      OMEPRAZOLE PO Take 20 mg by mouth.     predniSONE (DELTASONE) 10 MG tablet Take 2 tablets (20 mg total) by mouth daily. 14 tablet 0   zinc gluconate 50 MG tablet Take 50 mg by mouth daily.     No current facility-administered medications for this encounter.     REVIEW OF SYSTEMS: On review of systems, the patient reports that she is doing well overall.  She feels that she is healing well, she did have to complete 2 courses of antibiotics for an infection in the breast, but the pain has subsided.  She does report however that since Thursday of last week she has had a fullness and feeling of heaviness in the breast.     PHYSICAL EXAM:  Wt Readings from Last 3  Encounters:  06/23/21 190 lb 4.8 oz (86.3 kg)  06/15/21 186 lb 1.1 oz (84.4 kg)  05/10/21 190 lb 8 oz (86.4 kg)   Temp Readings from Last 3 Encounters:  06/23/21 (!) 97.2 F (36.2 C) (Temporal)  06/15/21 97.8 F (36.6 C)  05/10/21 97.9 F (36.6 C) (Tympanic)   BP Readings from Last 3 Encounters:  06/23/21 (!) 155/64  06/15/21 129/69  05/10/21 (!) 157/70  Pulse Readings from Last 3 Encounters:  06/23/21 (!) 58  06/15/21 62  05/10/21 (!) 51    In general this is a well appearing caucasian female in no acute distress. She's alert and oriented x4 and appropriate throughout the examination. Cardiopulmonary assessment is negative for acute distress and she exhibits normal effort.  Her right breast reveals 2 well-healed surgical incisions with induration deep to the incisions.  There is hyperpigmentation still of the breast that does not blanch.  It appears to be consistent with the history of recent infection.  Her breast however does seem to be more full in general especially in the lower outer quadrant the skin is taut, but no fluctuance is noted.    ECOG = 0  0 - Asymptomatic (Fully active, able to carry on all predisease activities without restriction)  1 - Symptomatic but completely ambulatory (Restricted in physically strenuous activity but ambulatory and able to carry out work of a light or sedentary nature. For example, light housework, office work)  2 - Symptomatic, <50% in bed during the day (Ambulatory and capable of all self care but unable to carry out any work activities. Up and about more than 50% of waking hours)  3 - Symptomatic, >50% in bed, but not bedbound (Capable of only limited self-care, confined to bed or chair 50% or more of waking hours)  4 - Bedbound (Completely disabled. Cannot carry on any self-care. Totally confined to bed or chair)  5 - Death   Eustace Pen MM, Creech RH, Tormey DC, et al. [phone]). "Toxicity and response criteria of the [facility] Group". [place] Oncol. 5 (6): 649-55    LABORATORY DATA:  Lab Results  Component Value Date   WBC 6.6 05/10/2021   HGB 15.4 (H) 05/10/2021   HCT 44.5 05/10/2021   MCV 90.6 05/10/2021   PLT 326 05/10/2021   Lab Results  Component Value Date   NA 142 05/10/2021   K 4.0 05/10/2021   CL 105 05/10/2021   CO2 28 05/10/2021   Lab Results  Component Value Date   ALT 19 05/10/2021   AST 21 05/10/2021   ALKPHOS 81 05/10/2021   BILITOT 0.8 05/10/2021      RADIOGRAPHY: No results found.     IMPRESSION/PLAN: 1. 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 Dr. Lisbeth Renshaw discusses the final pathology findings and reviews the nature of early stage right breast disease. The patient has healed well and she does not need systemic therapy.  Dr. Lisbeth Renshaw discusses the rationale for external radiotherapy to the breast  to reduce risks of local recurrence followed by antiestrogen therapy. We discussed the risks, benefits, short, and long term effects of radiotherapy, as well as the curative intent, and the patient is interested in proceeding. Dr. Lisbeth Renshaw discusses the delivery and logistics of radiotherapy and recommends 4 weeks of radiotherapy to the right breast. Written consent is obtained and placed in the chart, a copy was provided to the patient. She will simulate today.  2. Probable lymphedema of the breast.  I think the patient has lymphedema as a result of her breast size and recent infection as well as undergarments recent surgery.  I encouraged her to wear a tighter fitting sports bra, and let her know I would reach out to physical therapy for evaluation which I think should be this week to determine if she needs a compressive insert  for her bra I think we can still proceed today as above.  In a visit lasting 45 minutes, greater than 50% of the time was spent face to face reviewing her case,  as well as in preparation of, discussing, and coordinating the patient's care.  The above documentation reflects my direct findings during this shared patient visit. Please see the separate note by Dr. Lisbeth Renshaw on this date for the remainder of the patient's plan of care.    Carola Rhine, [facility]    **Disclaimer: This note was dictated with voice recognition software. Similar sounding words can inadvertently be transcribed and this note may contain transcription errors which may not have been corrected upon publication of note.**

## 2021-07-19 ENCOUNTER — Encounter: Payer: Self-pay | Admitting: Radiation Oncology

## 2021-07-19 ENCOUNTER — Ambulatory Visit: Payer: Medicare Other

## 2021-07-19 ENCOUNTER — Other Ambulatory Visit: Payer: Self-pay | Admitting: Radiation Oncology

## 2021-07-19 DIAGNOSIS — R600 Localized edema: Secondary | ICD-10-CM

## 2021-07-19 DIAGNOSIS — C50411 Malignant neoplasm of upper-outer quadrant of right female breast: Secondary | ICD-10-CM | POA: Diagnosis not present

## 2021-07-19 DIAGNOSIS — Z483 Aftercare following surgery for neoplasm: Secondary | ICD-10-CM

## 2021-07-19 DIAGNOSIS — R293 Abnormal posture: Secondary | ICD-10-CM | POA: Diagnosis not present

## 2021-07-19 NOTE — Therapy (Signed)
[place], [place], [address] Phone: [phone]   Fax:  [phone]  Physical Therapy Evaluation  Patient Details  Name: Nichole Ritter MRN: [id number] Date of Birth: 10-30-1950 Referring Provider (PT): Dr. Erroll Luna   Encounter Date: 07/19/2021   PT End of Session - 07/19/21 0942     Visit Number 1    Number of Visits 9    Date for PT Re-Evaluation 08/16/21    PT Start Time 0903    PT Stop Time 0940    PT Time Calculation (min) 37 min    Activity Tolerance Patient tolerated treatment well    Behavior During Therapy [facility] for tasks assessed/performed             Past Medical History:  Diagnosis Date   Allergic rhinitis    Aortic atherosclerosis ([place])    Arthritis    Barrett esophagus    Barrett's esophagus    Bunion    Cancer ([place])    right breast [facility]   Family history of breast cancer    Family history of leukemia    Family history of lung cancer    Family history of ovarian cancer    Family history of prostate cancer    Female cystocele    GERD (gastroesophageal reflux disease)    History of epistaxis 2012   Hyperlipidemia    UNSPECIFIED   Lichen sclerosus    Obesity    Osteopenia    Personal history of colonic polyps    Rectocele    Situational stress    Stress incontinence    Vitamin D deficiency     Past Surgical History:  Procedure Laterality Date   BREAST LUMPECTOMY     BREAST LUMPECTOMY WITH RADIOACTIVE SEED AND SENTINEL LYMPH NODE BIOPSY Right 06/15/2021   Procedure: RIGHT BREAST LUMPECTOMY WITH RADIOACTIVE SEED X 2 AND SENTINEL LYMPH NODE BIOPSY;  Surgeon: Erroll Luna, MD;  Location: [place];  Service: General;  Laterality: Right;   CATARACT EXTRACTION Bilateral 02/2018   03/2018   COLONOSCOPY  01/2013   LUMBAR DISC SURGERY     TUBAL LIGATION      There were no vitals filed for this visit.    Subjective Assessment - 07/19/21 0905      Subjective Pt.was sent for CT simulation for radiation and the nurse thought my right breast was swollen.  The area that hadn't healed as well is now healed with a scab. It has not drained since the middle of last week. I am supposed to start radiation on Tuesday and I see the surgeon on Tuesday. The breast is tender and I get occasional twinges. I feel like shoulder ROM is still good.    Pertinent History Patient was diagnosed on 02/21/2021 with right invasive lobular carcinoma breast cancer. She underwent a right lumpectomy and sentinel node biopsy (1 negative node) on 06/15/2021.  She is to start radiation next week. She has osteopenia, compression fx, and multiple herniated discs in her lumbar spine.    Patient Stated Goals check breast swelling    Currently in Pain? No/denies    Pain Score 0-No pain    Multiple Pain Sites No                [facility] PT Assessment - 07/19/21 0001       Assessment   Medical Diagnosis s/p right lumpectomy and SLNB    Referring Provider (PT) Dr. Erroll Luna  Onset Date/Surgical Date 06/15/21    Hand Dominance Right    Prior Therapy yes      Restrictions   Weight Bearing Restrictions No      Balance Screen   Has the patient fallen in the past 6 months No    Has the patient had a decrease in activity level because of a fear of falling?  No    Is the patient reluctant to leave their home because of a fear of falling?  No      Home Ecologist residence    Living Arrangements Spouse/significant other    Available Help at Discharge Family      Prior Function   Level of [place] Retired    Leisure She does a HEP for her back but no other exercise and she is walking 5x/week for 20 minutes      Cognition   Overall Cognitive Status Within Functional Limits for tasks assessed      Observation/Other Assessments   Observations Left breast with 2 incisions with upper incision with a small scab  present,  swelling noted at lateral breast with fibrosis noted around bilateral incisions      Posture/Postural Control   Posture/Postural Control Postural limitations    Postural Limitations Rounded Shoulders;Forward head      AROM   Right/Left Shoulder Right    Right Shoulder Extension 62 Degrees    Right Shoulder Flexion 155 Degrees    Right Shoulder ABduction 170 Degrees    Right Shoulder External Rotation 90 Degrees               LYMPHEDEMA/ONCOLOGY QUESTIONNAIRE - 07/19/21 0001       Type   Cancer Type Right breast cancer      Surgeries   Lumpectomy Date 06/15/21    Sentinel Lymph Node Biopsy Date 06/15/21    Number Lymph Nodes Removed 1      Treatment   Active Chemotherapy Treatment No    Past Chemotherapy Treatment No    Active Radiation Treatment Yes   starting next week   Past Radiation Treatment No    Current Hormone Treatment No   will have after radiation   Past Hormone Therapy No      What other symptoms do you have   Are you Having Heaviness or Tightness Yes   right breast   Are you having Pain --   tenderness/occasional shooting pains   Are you having pitting edema No    Is it Hard or Difficult finding clothes that fit No    Do you have infections Yes    Comments finished second course on Saturday    Is there Decreased scar mobility Yes    [place] Sign No      Right Upper Extremity Lymphedema   10 cm Proximal to Olecranon Process 30.9 cm    Olecranon Process 26.8 cm    10 cm Proximal to Ulnar Styloid Process 25.6 cm    Just Proximal to Ulnar Styloid Process 17.6 cm    Across Hand at [employer] 20.1 cm    At [place] of 2nd Digit 6.2 cm                     Objective measurements completed on examination: See above findings.                PT Education - 07/19/21 7517  Education Details pt was educated in role of lymphatics and MLD    Person(s) Educated Patient    Methods Explanation    Comprehension  Verbalized understanding                 PT Long Term Goals - 07/19/21 0950       PT LONG TERM GOAL #1   Title pt will report decreased heaviness in right breat by 50%    Time 4    Period Weeks    Status New    Target Date 08/16/21      PT LONG TERM GOAL #2   Title pt will be independent with self breast MLD    Time 4    Period Weeks    Status New    Target Date 08/16/21      PT LONG TERM GOAL #3   Title Pt will attend ABC class    Time 4    Period Weeks    Status New    Target Date 08/16/21                    Plan - 07/19/21 0943     Clinical Impression Statement Pt is s/p right double lumpectomy with SLNB on 06/15/2021. She has done exceptionally well with shoulder ROM, but was referred here for right breast swelling.  She had difficulty with initial wound healing with some drainage at the upper incision. She still has a small scab present but no drainage since last week.  She finished her second course of antibiotics last week. The right breast is swollen and heavy with fibrosis noted around the breast incisions and swelling most notable at the lateral breast.  She will call  today to get a compression bra.  She was educated about Manual Lymphatic drainage and how it is used to decongest swollen areas. She starts radiation next week    Personal Factors and Comorbidities Comorbidity 2    Comorbidities Right breast double lumpectomy with SLNB, radiation, hx of compression fx    Stability/Clinical Decision Making Stable/Uncomplicated    Clinical Decision Making Low    Rehab Potential Excellent    PT Frequency 2x / week    PT Duration 4 weeks    PT Treatment/Interventions ADLs/Self Care Home Management;Patient/family education;Manual techniques;Manual lymph drainage;Taping;Scar mobilization;Passive range of motion;Therapeutic exercise    PT Next Visit Plan perform right breast MLD and instruct pt, foam packs prn    PT Home Exercise Plan Post op HEP     Consulted and Agree with Plan of Care Patient             Patient will benefit from skilled therapeutic intervention in order to improve the following deficits and impairments:  Increased edema, Postural dysfunction, Decreased scar mobility  Visit Diagnosis: Carcinoma of upper-outer quadrant of right female breast, unspecified estrogen receptor status ([place])  Abnormal posture  Aftercare following surgery for neoplasm  Localized edema     Problem List Patient Active Problem List   Diagnosis Date Noted   Genetic testing 05/20/2021   Family history of prostate cancer 05/10/2021   Family history of breast cancer 05/10/2021   Family history of ovarian cancer 05/10/2021   Family history of leukemia 05/10/2021   Family history of lung cancer 05/10/2021   Malignant neoplasm of upper-outer quadrant of right female breast ([place]) 05/05/2021   Aortic atherosclerosis ([place]) 01/06/2019   Mixed hyperlipidemia 01/06/2019   Family history of arteriosclerotic cardiovascular disease 01/06/2019  Claris Pong, PT 07/19/2021, 9:52 AM  [place], [place], [address] Phone: [phone]   Fax:  [phone]  Name: Alera Quevedo MRN: [id number] Date of Birth: [birth year]

## 2021-07-19 NOTE — Addendum Note (Signed)
Addended by: Claris Pong on: 07/19/2021 12:33 PM   Modules accepted: Orders

## 2021-07-20 DIAGNOSIS — C50411 Malignant neoplasm of upper-outer quadrant of right female breast: Secondary | ICD-10-CM | POA: Diagnosis not present

## 2021-07-21 ENCOUNTER — Other Ambulatory Visit: Payer: Self-pay

## 2021-07-21 ENCOUNTER — Ambulatory Visit: Payer: Medicare Other | Admitting: Physical Therapy

## 2021-07-21 DIAGNOSIS — Z483 Aftercare following surgery for neoplasm: Secondary | ICD-10-CM

## 2021-07-21 DIAGNOSIS — R293 Abnormal posture: Secondary | ICD-10-CM

## 2021-07-21 DIAGNOSIS — R600 Localized edema: Secondary | ICD-10-CM

## 2021-07-21 DIAGNOSIS — C50411 Malignant neoplasm of upper-outer quadrant of right female breast: Secondary | ICD-10-CM

## 2021-07-21 NOTE — Therapy (Signed)
[place], [place], [address] Phone: [phone]   Fax:  [phone]  Physical Therapy Treatment  Patient Details  Name: Nichole Ritter MRN: [id number] Date of Birth: [birth year] Referring Provider (PT): Dr. Erroll Luna   Encounter Date: 07/21/2021   PT End of Session - 07/21/21 1154     Visit Number 2    Number of Visits 9    PT Start Time 1100    PT Stop Time 9242    PT Time Calculation (min) 45 min    Activity Tolerance Patient tolerated treatment well    Behavior During Therapy [facility] for tasks assessed/performed             Past Medical History:  Diagnosis Date   Allergic rhinitis    Aortic atherosclerosis ([place])    Arthritis    Barrett esophagus    Barrett's esophagus    Bunion    Cancer ([place])    right breast [facility]   Family history of breast cancer    Family history of leukemia    Family history of lung cancer    Family history of ovarian cancer    Family history of prostate cancer    Female cystocele    GERD (gastroesophageal reflux disease)    History of epistaxis 2012   Hyperlipidemia    UNSPECIFIED   Lichen sclerosus    Obesity    Osteopenia    Personal history of colonic polyps    Rectocele    Situational stress    Stress incontinence    Vitamin D deficiency     Past Surgical History:  Procedure Laterality Date   BREAST LUMPECTOMY     BREAST LUMPECTOMY WITH RADIOACTIVE SEED AND SENTINEL LYMPH NODE BIOPSY Right 06/15/2021   Procedure: RIGHT BREAST LUMPECTOMY WITH RADIOACTIVE SEED X 2 AND SENTINEL LYMPH NODE BIOPSY;  Surgeon: Erroll Luna, MD;  Location: [facility];  Service: General;  Laterality: Right;   CATARACT EXTRACTION Bilateral 02/2018   03/2018   COLONOSCOPY  01/2013   LUMBAR DISC SURGERY     TUBAL LIGATION      There were no vitals filed for this visit.   Subjective Assessment - 07/21/21 1150     Subjective Pt says she will be starting  radiation on Tuesday and she also has an appointment to see her surgeon on Tuesday.  She got her compression bra yesterday and comes in wearing a [employer]    Patient Stated Goals check breast swelling    Currently in Pain? No/denies   occasional tenderness in breast                              [facility] Adult PT Treatment/Exercise - 07/21/21 0001       Manual Therapy   Manual Therapy Manual Lymphatic Drainage (MLD)    Manual therapy comments provided a small patch of peach foam to wear inside bra at full area    Manual Lymphatic Drainage (MLD) insturcted with verbal cues, hand over hand and use of mirror for visual imput, short neck 5 diaphragmatic breaths, left axiallary nodes. anterior interaxillary anastamosis and right breast., right inguinal nodes and axillo-inguinal anastamosis and right lateral breast in supine and sidelying .                     PT Education - 07/21/21 1154     Education Details  self manual lymph drainage to right breast    Person(s) Educated Patient    Methods Explanation;Demonstration;Verbal cues;Tactile cues;Handout    Comprehension Verbalized understanding;Returned demonstration;Need further instruction                 PT Long Term Goals - 07/19/21 0950       PT LONG TERM GOAL #1   Title pt will report decreased heaviness in right breat by 50%    Time 4    Period Weeks    Status New    Target Date 08/16/21      PT LONG TERM GOAL #2   Title pt will be independent with self breast MLD    Time 4    Period Weeks    Status New    Target Date 08/16/21      PT LONG TERM GOAL #3   Title Pt will attend ABC class    Time 4    Period Weeks    Status New    Target Date 08/16/21                   Plan - 07/21/21 1155     Clinical Impression Statement Pt will fullness in right breast with firmer area just lateral to nipple. She was instructed in self MLD to breast and was able to perform well  initially.  She is wearing a compression bra and small patch was added for more compression to firm area. Pt will monitor her response to compression and will remove patch if necessary.    Personal Factors and Comorbidities Comorbidity 2    Comorbidities Right breast double lumpectomy with SLNB, radiation, hx of compression fx    Stability/Clinical Decision Making Stable/Uncomplicated    Rehab Potential Excellent    PT Frequency 2x / week    PT Duration 4 weeks    PT Treatment/Interventions ADLs/Self Care Home Management;Patient/family education;Manual techniques;Manual lymph drainage;Taping;Scar mobilization;Passive range of motion;Therapeutic exercise    PT Next Visit Plan reinforce teaching self MLD perform right breast MLD and instruct pt, foam packs prn    Consulted and Agree with Plan of Care Patient             Patient will benefit from skilled therapeutic intervention in order to improve the following deficits and impairments:  Increased edema, Postural dysfunction, Decreased scar mobility  Visit Diagnosis: Carcinoma of upper-outer quadrant of right female breast, unspecified estrogen receptor status ([place])  Aftercare following surgery for neoplasm  Localized edema  Abnormal posture     Problem List Patient Active Problem List   Diagnosis Date Noted   Genetic testing 05/20/2021   Family history of prostate cancer 05/10/2021   Family history of breast cancer 05/10/2021   Family history of ovarian cancer 05/10/2021   Family history of leukemia 05/10/2021   Family history of lung cancer 05/10/2021   Malignant neoplasm of upper-outer quadrant of right female breast ([place]) 05/05/2021   Aortic atherosclerosis ([place]) 01/06/2019   Mixed hyperlipidemia 01/06/2019   Family history of arteriosclerotic cardiovascular disease 01/06/2019   Donato Heinz. Owens Shark PT  Norwood Levo, PT 07/21/2021, 11:57 AM  [place], [place], [address] Phone: [phone]   Fax:  [phone]  Name: Nichole Ritter MRN: [id number] Date of Birth: 09/04/1951

## 2021-07-21 NOTE — Patient Instructions (Signed)
Manual Lymph Drainage for Right Breast.  Do daily.  Do slowly. Use flat hands with just enough pressure to stretch the skin. Do not slide over the skin, but move the skin with the hand you're using. Lie down or sit comfortably (in a recliner, for example) to do this.  Hug yourself:  cross arms and do circles at collar bones near neck 5-7 times (to "wake up" lots of lymph nodes in this area). Take slow deep breaths, allowing your belly to balloon out as your breathe in, 5x (to "wake up" abdominal lymph nodes to take on extra fluid). Left armpit--stretch skin in small circles to stimulate intact lymph nodes there, 5-7x. Right groin area, at panty line--stretch skin in small circles to stimulate lymph nodes 5-7x. Redirect fluid from right chest toward left armpit (stretch skin starting at right chest in 3-4 spots working toward left armpit) 3-4x across the chest. Redirect fluid from right armpit toward right groin (cup your hand around the curve of your right side and do 3-4 "pumps" from armpit to groin) 3-4x down your side. Draw an imaginary diagonal line from upper outer breast through the nipple area toward lower inner breast.  Direct fluid upward and inward from this line toward the pathway across your upper chest (established in #5).  Do this in three rows to treat all of the upper inner breast tissue, and do each row 3-4x. Then repeat #5 above. From the imaginary diagonal, direct fluid in three rows (to treat all of lower outer breast tissue) downward and outward toward pathway established in #6 that is aimed at the right groin.  Then repeat #6 above.  End with repeating #3 and #4 above.   [facility] [address], [place]   [address] [phone]

## 2021-07-23 DIAGNOSIS — Z170 Estrogen receptor positive status [ER+]: Secondary | ICD-10-CM | POA: Diagnosis not present

## 2021-07-23 DIAGNOSIS — C50411 Malignant neoplasm of upper-outer quadrant of right female breast: Secondary | ICD-10-CM | POA: Diagnosis not present

## 2021-07-23 DIAGNOSIS — Z510 Encounter for antineoplastic radiation therapy: Secondary | ICD-10-CM | POA: Diagnosis not present

## 2021-07-24 ENCOUNTER — Other Ambulatory Visit: Payer: Self-pay

## 2021-07-24 ENCOUNTER — Encounter: Payer: Self-pay | Admitting: Physical Therapy

## 2021-07-24 ENCOUNTER — Telehealth: Payer: Self-pay | Admitting: Hematology and Oncology

## 2021-07-24 ENCOUNTER — Encounter: Payer: Self-pay | Admitting: *Deleted

## 2021-07-24 ENCOUNTER — Ambulatory Visit: Payer: Medicare Other | Admitting: Physical Therapy

## 2021-07-24 DIAGNOSIS — Z483 Aftercare following surgery for neoplasm: Secondary | ICD-10-CM

## 2021-07-24 DIAGNOSIS — R293 Abnormal posture: Secondary | ICD-10-CM | POA: Diagnosis not present

## 2021-07-24 DIAGNOSIS — C50411 Malignant neoplasm of upper-outer quadrant of right female breast: Secondary | ICD-10-CM

## 2021-07-24 DIAGNOSIS — R600 Localized edema: Secondary | ICD-10-CM | POA: Diagnosis not present

## 2021-07-24 NOTE — Therapy (Signed)
[place], [place], [address] Phone: [phone]   Fax:  [phone]  Physical Therapy Treatment  Patient Details  Name: Nichole Ritter MRN: [id number] Date of Birth: 10-Jun-1951 Referring Provider (PT): Dr. Erroll Luna   Encounter Date: 07/24/2021   PT End of Session - 07/24/21 1454     Visit Number 3    Number of Visits 9    Date for PT Re-Evaluation 08/16/21    PT Start Time 4656    PT Stop Time 1455    PT Time Calculation (min) 50 min    Activity Tolerance Patient tolerated treatment well    Behavior During Therapy [facility] for tasks assessed/performed             Past Medical History:  Diagnosis Date   Allergic rhinitis    Aortic atherosclerosis ([place])    Arthritis    Barrett esophagus    Barrett's esophagus    Bunion    Cancer ([place])    right breast [facility]   Family history of breast cancer    Family history of leukemia    Family history of lung cancer    Family history of ovarian cancer    Family history of prostate cancer    Female cystocele    GERD (gastroesophageal reflux disease)    History of epistaxis 2012   Hyperlipidemia    UNSPECIFIED   Lichen sclerosus    Obesity    Osteopenia    Personal history of colonic polyps    Rectocele    Situational stress    Stress incontinence    Vitamin D deficiency     Past Surgical History:  Procedure Laterality Date   BREAST LUMPECTOMY     BREAST LUMPECTOMY WITH RADIOACTIVE SEED AND SENTINEL LYMPH NODE BIOPSY Right 06/15/2021   Procedure: RIGHT BREAST LUMPECTOMY WITH RADIOACTIVE SEED X 2 AND SENTINEL LYMPH NODE BIOPSY;  Surgeon: Erroll Luna, MD;  Location: [place];  Service: General;  Laterality: Right;   CATARACT EXTRACTION Bilateral 02/2018   03/2018   COLONOSCOPY  01/2013   LUMBAR DISC SURGERY     TUBAL LIGATION      There were no vitals filed for this visit.   Subjective Assessment - 07/24/21 1406      Subjective I may be too fast with the massage. I have done it about four times since I was here last.    Pertinent History Patient was diagnosed on 02/21/2021 with right invasive lobular carcinoma breast cancer. She underwent a right lumpectomy and sentinel node biopsy (1 negative node) on 06/15/2021. 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    Patient Stated Goals check breast swelling    Currently in Pain? No/denies    Pain Score 0-No pain                               [facility] Adult PT Treatment/Exercise - 07/24/21 0001       Manual Therapy   Manual Lymphatic Drainage (MLD) had pt perform the following seqeunce: short neck 5 diaphragmatic breaths, left axillary nodes. anterior interaxillary anastamosis and right breast., right inguinal nodes and axillo-inguinal anastamosis and right breast in supine  - gave pt verbal cues as needed but overall she demonstrated good skin stretch and good speed, therapist completed MLD  PT Long Term Goals - 07/19/21 0950       PT LONG TERM GOAL #1   Title pt will report decreased heaviness in right breat by 50%    Time 4    Period Weeks    Status New    Target Date 08/16/21      PT LONG TERM GOAL #2   Title pt will be independent with self breast MLD    Time 4    Period Weeks    Status New    Target Date 08/16/21      PT LONG TERM GOAL #3   Title Pt will attend ABC class    Time 4    Period Weeks    Status New    Target Date 08/16/21                   Plan - 07/24/21 1458     Clinical Impression Statement Had pt demonstrate self MLD technique today as therapist provided verbal feedback as needed. Pt had a good understanding of the proper sequence and skin stretch. She only needed verbal cues for speed once. Also educated pt better positioning to help her reach her right lateral trunk. Therapist completed MLD today. Encouraged to continue practicing  self MLD at home.    PT Frequency 2x / week    PT Duration 4 weeks    PT Treatment/Interventions ADLs/Self Care Home Management;Patient/family education;Manual techniques;Manual lymph drainage;Taping;Scar mobilization;Passive range of motion;Therapeutic exercise    PT Next Visit Plan reinforce teaching self MLD perform right breast MLD and instruct pt, foam packs prn    PT Home Exercise Plan Post op HEP, self MLD    Consulted and Agree with Plan of Care Patient             Patient will benefit from skilled therapeutic intervention in order to improve the following deficits and impairments:  Increased edema, Postural dysfunction, Decreased scar mobility  Visit Diagnosis: Localized edema  Aftercare following surgery for neoplasm  Abnormal posture  Carcinoma of upper-outer quadrant of right female breast, unspecified estrogen receptor status ([place])     Problem List Patient Active Problem List   Diagnosis Date Noted   Genetic testing 05/20/2021   Family history of prostate cancer 05/10/2021   Family history of breast cancer 05/10/2021   Family history of ovarian cancer 05/10/2021   Family history of leukemia 05/10/2021   Family history of lung cancer 05/10/2021   Malignant neoplasm of upper-outer quadrant of right female breast ([place]) 05/05/2021   Aortic atherosclerosis ([place]) 01/06/2019   Mixed hyperlipidemia 01/06/2019   Family history of arteriosclerotic cardiovascular disease 01/06/2019    Manus Gunning, PT 07/24/2021, 3:00 PM  [place], [place], [address] Phone: [phone]   Fax:  [phone]  Name: Nichole Ritter MRN: [id number] Date of Birth: Feb 21, 1951   Manus Gunning, PT 07/24/21 3:01 PM

## 2021-07-24 NOTE — Telephone Encounter (Signed)
Scheduled per sch msg. Called and left a msg.

## 2021-07-25 ENCOUNTER — Ambulatory Visit
Admission: RE | Admit: 2021-07-25 | Discharge: 2021-07-25 | Disposition: A | Discharge status: Home / Self Care | Payer: Medicare Other | Source: Ambulatory Visit | Attending: Radiation Oncology | Admitting: Radiation Oncology

## 2021-07-25 DIAGNOSIS — Z510 Encounter for antineoplastic radiation therapy: Secondary | ICD-10-CM | POA: Diagnosis not present

## 2021-07-25 DIAGNOSIS — C50411 Malignant neoplasm of upper-outer quadrant of right female breast: Secondary | ICD-10-CM | POA: Diagnosis not present

## 2021-07-25 DIAGNOSIS — Z170 Estrogen receptor positive status [ER+]: Secondary | ICD-10-CM | POA: Diagnosis not present

## 2021-07-26 ENCOUNTER — Other Ambulatory Visit: Payer: Self-pay

## 2021-07-26 ENCOUNTER — Ambulatory Visit
Admission: RE | Admit: 2021-07-26 | Discharge: 2021-07-26 | Disposition: A | Discharge status: Home / Self Care | Payer: Medicare Other | Source: Ambulatory Visit | Attending: Radiation Oncology | Admitting: Radiation Oncology

## 2021-07-26 DIAGNOSIS — Z170 Estrogen receptor positive status [ER+]: Secondary | ICD-10-CM | POA: Diagnosis not present

## 2021-07-26 DIAGNOSIS — Z510 Encounter for antineoplastic radiation therapy: Secondary | ICD-10-CM | POA: Diagnosis not present

## 2021-07-26 DIAGNOSIS — C50411 Malignant neoplasm of upper-outer quadrant of right female breast: Secondary | ICD-10-CM | POA: Diagnosis not present

## 2021-07-27 ENCOUNTER — Other Ambulatory Visit: Payer: Self-pay

## 2021-07-27 ENCOUNTER — Ambulatory Visit
Admission: RE | Admit: 2021-07-27 | Discharge: 2021-07-27 | Disposition: A | Discharge status: Home / Self Care | Payer: Medicare Other | Source: Ambulatory Visit | Attending: Radiation Oncology | Admitting: Radiation Oncology

## 2021-07-27 DIAGNOSIS — C50411 Malignant neoplasm of upper-outer quadrant of right female breast: Secondary | ICD-10-CM | POA: Diagnosis not present

## 2021-07-27 DIAGNOSIS — Z170 Estrogen receptor positive status [ER+]: Secondary | ICD-10-CM | POA: Diagnosis not present

## 2021-07-27 DIAGNOSIS — Z510 Encounter for antineoplastic radiation therapy: Secondary | ICD-10-CM | POA: Diagnosis not present

## 2021-07-27 NOTE — Progress Notes (Signed)
Pt here for patient teaching.  Pt given Radiation and You booklet, skin care instructions, Alra deodorant, and Radiaplex gel.  Reviewed areas of pertinence such as fatigue, hair loss, skin changes, breast tenderness, and breast swelling . Pt able to give teach back of to pat skin and use unscented/gentle soap,apply Radiaplex bid, avoid applying anything to skin within 4 hours of treatment, avoid wearing an under wire bra, and to use an electric razor if they must shave. Pt verbalizes understanding of information given and will contact nursing with any questions or concerns.     Http://rtanswers.org/treatmentinformation/whattoexpect/index  Priscella Donna M. Lovel Suazo RN, BSN       

## 2021-07-28 ENCOUNTER — Ambulatory Visit
Admission: RE | Admit: 2021-07-28 | Discharge: 2021-07-28 | Disposition: A | Discharge status: Home / Self Care | Payer: Medicare Other | Source: Ambulatory Visit | Attending: Radiation Oncology | Admitting: Radiation Oncology

## 2021-07-28 DIAGNOSIS — Z170 Estrogen receptor positive status [ER+]: Secondary | ICD-10-CM | POA: Diagnosis not present

## 2021-07-28 DIAGNOSIS — C50411 Malignant neoplasm of upper-outer quadrant of right female breast: Secondary | ICD-10-CM | POA: Diagnosis not present

## 2021-07-28 DIAGNOSIS — Z510 Encounter for antineoplastic radiation therapy: Secondary | ICD-10-CM | POA: Diagnosis not present

## 2021-07-28 MED ORDER — ALRA NON-METALLIC DEODORANT (RAD-ONC)
1.0000 "application " | Freq: Once | TOPICAL | Status: AC
  Administered 2021-07-28: 1 via TOPICAL

## 2021-07-28 MED ORDER — RADIAPLEXRX EX GEL: Freq: Once | CUTANEOUS | Status: AC

## 2021-07-31 ENCOUNTER — Ambulatory Visit
Admission: RE | Admit: 2021-07-31 | Discharge: 2021-07-31 | Disposition: A | Discharge status: Home / Self Care | Payer: Medicare Other | Source: Ambulatory Visit | Attending: Radiation Oncology | Admitting: Radiation Oncology

## 2021-07-31 ENCOUNTER — Other Ambulatory Visit: Payer: Self-pay

## 2021-07-31 DIAGNOSIS — Z170 Estrogen receptor positive status [ER+]: Secondary | ICD-10-CM | POA: Insufficient documentation

## 2021-07-31 DIAGNOSIS — Z510 Encounter for antineoplastic radiation therapy: Secondary | ICD-10-CM | POA: Insufficient documentation

## 2021-07-31 DIAGNOSIS — R293 Abnormal posture: Secondary | ICD-10-CM | POA: Diagnosis not present

## 2021-07-31 DIAGNOSIS — Z483 Aftercare following surgery for neoplasm: Secondary | ICD-10-CM | POA: Insufficient documentation

## 2021-07-31 DIAGNOSIS — R600 Localized edema: Secondary | ICD-10-CM | POA: Diagnosis not present

## 2021-07-31 DIAGNOSIS — Z923 Personal history of irradiation: Secondary | ICD-10-CM | POA: Diagnosis not present

## 2021-07-31 DIAGNOSIS — C50411 Malignant neoplasm of upper-outer quadrant of right female breast: Secondary | ICD-10-CM | POA: Diagnosis not present

## 2021-08-01 ENCOUNTER — Ambulatory Visit
Admission: RE | Admit: 2021-08-01 | Discharge: 2021-08-01 | Disposition: A | Discharge status: Home / Self Care | Payer: Medicare Other | Source: Ambulatory Visit | Attending: Radiation Oncology | Admitting: Radiation Oncology

## 2021-08-01 DIAGNOSIS — Z483 Aftercare following surgery for neoplasm: Secondary | ICD-10-CM | POA: Diagnosis not present

## 2021-08-01 DIAGNOSIS — Z923 Personal history of irradiation: Secondary | ICD-10-CM | POA: Diagnosis not present

## 2021-08-01 DIAGNOSIS — Z170 Estrogen receptor positive status [ER+]: Secondary | ICD-10-CM | POA: Diagnosis not present

## 2021-08-01 DIAGNOSIS — C50411 Malignant neoplasm of upper-outer quadrant of right female breast: Secondary | ICD-10-CM | POA: Diagnosis not present

## 2021-08-01 DIAGNOSIS — R600 Localized edema: Secondary | ICD-10-CM | POA: Diagnosis not present

## 2021-08-01 DIAGNOSIS — Z510 Encounter for antineoplastic radiation therapy: Secondary | ICD-10-CM | POA: Diagnosis not present

## 2021-08-01 DIAGNOSIS — R293 Abnormal posture: Secondary | ICD-10-CM | POA: Diagnosis not present

## 2021-08-02 ENCOUNTER — Encounter: Payer: Self-pay | Admitting: *Deleted

## 2021-08-02 ENCOUNTER — Other Ambulatory Visit: Payer: Self-pay

## 2021-08-02 ENCOUNTER — Ambulatory Visit
Admission: RE | Admit: 2021-08-02 | Discharge: 2021-08-02 | Disposition: A | Discharge status: Home / Self Care | Payer: Medicare Other | Source: Ambulatory Visit | Attending: Radiation Oncology | Admitting: Radiation Oncology

## 2021-08-02 DIAGNOSIS — Z923 Personal history of irradiation: Secondary | ICD-10-CM | POA: Diagnosis not present

## 2021-08-02 DIAGNOSIS — C50411 Malignant neoplasm of upper-outer quadrant of right female breast: Secondary | ICD-10-CM | POA: Diagnosis not present

## 2021-08-02 DIAGNOSIS — Z483 Aftercare following surgery for neoplasm: Secondary | ICD-10-CM | POA: Diagnosis not present

## 2021-08-02 DIAGNOSIS — Z510 Encounter for antineoplastic radiation therapy: Secondary | ICD-10-CM | POA: Diagnosis not present

## 2021-08-02 DIAGNOSIS — R600 Localized edema: Secondary | ICD-10-CM | POA: Diagnosis not present

## 2021-08-02 DIAGNOSIS — R293 Abnormal posture: Secondary | ICD-10-CM | POA: Diagnosis not present

## 2021-08-02 DIAGNOSIS — Z170 Estrogen receptor positive status [ER+]: Secondary | ICD-10-CM | POA: Diagnosis not present

## 2021-08-03 ENCOUNTER — Ambulatory Visit
Admission: RE | Admit: 2021-08-03 | Discharge: 2021-08-03 | Disposition: A | Discharge status: Home / Self Care | Payer: Medicare Other | Source: Ambulatory Visit | Attending: Radiation Oncology | Admitting: Radiation Oncology

## 2021-08-03 DIAGNOSIS — R293 Abnormal posture: Secondary | ICD-10-CM | POA: Diagnosis not present

## 2021-08-03 DIAGNOSIS — Z510 Encounter for antineoplastic radiation therapy: Secondary | ICD-10-CM | POA: Diagnosis not present

## 2021-08-03 DIAGNOSIS — Z170 Estrogen receptor positive status [ER+]: Secondary | ICD-10-CM | POA: Diagnosis not present

## 2021-08-03 DIAGNOSIS — C50411 Malignant neoplasm of upper-outer quadrant of right female breast: Secondary | ICD-10-CM | POA: Diagnosis not present

## 2021-08-03 DIAGNOSIS — R600 Localized edema: Secondary | ICD-10-CM | POA: Diagnosis not present

## 2021-08-03 DIAGNOSIS — Z923 Personal history of irradiation: Secondary | ICD-10-CM | POA: Diagnosis not present

## 2021-08-03 DIAGNOSIS — Z483 Aftercare following surgery for neoplasm: Secondary | ICD-10-CM | POA: Diagnosis not present

## 2021-08-04 ENCOUNTER — Other Ambulatory Visit: Payer: Self-pay

## 2021-08-04 ENCOUNTER — Ambulatory Visit
Admission: RE | Admit: 2021-08-04 | Discharge: 2021-08-04 | Disposition: A | Discharge status: Home / Self Care | Payer: Medicare Other | Source: Ambulatory Visit | Attending: Radiation Oncology | Admitting: Radiation Oncology

## 2021-08-04 DIAGNOSIS — Z923 Personal history of irradiation: Secondary | ICD-10-CM | POA: Diagnosis not present

## 2021-08-04 DIAGNOSIS — Z510 Encounter for antineoplastic radiation therapy: Secondary | ICD-10-CM | POA: Diagnosis not present

## 2021-08-04 DIAGNOSIS — R600 Localized edema: Secondary | ICD-10-CM | POA: Diagnosis not present

## 2021-08-04 DIAGNOSIS — Z483 Aftercare following surgery for neoplasm: Secondary | ICD-10-CM | POA: Diagnosis not present

## 2021-08-04 DIAGNOSIS — C50411 Malignant neoplasm of upper-outer quadrant of right female breast: Secondary | ICD-10-CM | POA: Diagnosis not present

## 2021-08-04 DIAGNOSIS — R293 Abnormal posture: Secondary | ICD-10-CM | POA: Diagnosis not present

## 2021-08-04 DIAGNOSIS — Z170 Estrogen receptor positive status [ER+]: Secondary | ICD-10-CM | POA: Diagnosis not present

## 2021-08-07 ENCOUNTER — Other Ambulatory Visit: Payer: Self-pay

## 2021-08-07 ENCOUNTER — Ambulatory Visit
Admission: RE | Admit: 2021-08-07 | Discharge: 2021-08-07 | Disposition: A | Discharge status: Home / Self Care | Payer: Medicare Other | Source: Ambulatory Visit | Attending: Radiation Oncology | Admitting: Radiation Oncology

## 2021-08-07 DIAGNOSIS — Z170 Estrogen receptor positive status [ER+]: Secondary | ICD-10-CM | POA: Diagnosis not present

## 2021-08-07 DIAGNOSIS — R600 Localized edema: Secondary | ICD-10-CM | POA: Diagnosis not present

## 2021-08-07 DIAGNOSIS — Z923 Personal history of irradiation: Secondary | ICD-10-CM | POA: Diagnosis not present

## 2021-08-07 DIAGNOSIS — Z510 Encounter for antineoplastic radiation therapy: Secondary | ICD-10-CM | POA: Diagnosis not present

## 2021-08-07 DIAGNOSIS — C50411 Malignant neoplasm of upper-outer quadrant of right female breast: Secondary | ICD-10-CM | POA: Diagnosis not present

## 2021-08-07 DIAGNOSIS — Z483 Aftercare following surgery for neoplasm: Secondary | ICD-10-CM | POA: Diagnosis not present

## 2021-08-07 DIAGNOSIS — R293 Abnormal posture: Secondary | ICD-10-CM | POA: Diagnosis not present

## 2021-08-08 ENCOUNTER — Ambulatory Visit
Admission: RE | Admit: 2021-08-08 | Discharge: 2021-08-08 | Disposition: A | Discharge status: Home / Self Care | Payer: Medicare Other | Source: Ambulatory Visit | Attending: Radiation Oncology | Admitting: Radiation Oncology

## 2021-08-08 DIAGNOSIS — Z923 Personal history of irradiation: Secondary | ICD-10-CM | POA: Diagnosis not present

## 2021-08-08 DIAGNOSIS — R293 Abnormal posture: Secondary | ICD-10-CM | POA: Diagnosis not present

## 2021-08-08 DIAGNOSIS — C50411 Malignant neoplasm of upper-outer quadrant of right female breast: Secondary | ICD-10-CM | POA: Diagnosis not present

## 2021-08-08 DIAGNOSIS — Z483 Aftercare following surgery for neoplasm: Secondary | ICD-10-CM | POA: Diagnosis not present

## 2021-08-08 DIAGNOSIS — Z170 Estrogen receptor positive status [ER+]: Secondary | ICD-10-CM | POA: Diagnosis not present

## 2021-08-08 DIAGNOSIS — R600 Localized edema: Secondary | ICD-10-CM | POA: Diagnosis not present

## 2021-08-08 DIAGNOSIS — Z510 Encounter for antineoplastic radiation therapy: Secondary | ICD-10-CM | POA: Diagnosis not present

## 2021-08-09 ENCOUNTER — Ambulatory Visit
Admission: RE | Admit: 2021-08-09 | Discharge: 2021-08-09 | Disposition: A | Discharge status: Home / Self Care | Payer: Medicare Other | Source: Ambulatory Visit | Attending: Radiation Oncology | Admitting: Radiation Oncology

## 2021-08-09 ENCOUNTER — Other Ambulatory Visit: Payer: Self-pay

## 2021-08-09 ENCOUNTER — Ambulatory Visit: Payer: Medicare Other

## 2021-08-09 DIAGNOSIS — R293 Abnormal posture: Secondary | ICD-10-CM

## 2021-08-09 DIAGNOSIS — Z170 Estrogen receptor positive status [ER+]: Secondary | ICD-10-CM | POA: Diagnosis not present

## 2021-08-09 DIAGNOSIS — R600 Localized edema: Secondary | ICD-10-CM | POA: Insufficient documentation

## 2021-08-09 DIAGNOSIS — Z483 Aftercare following surgery for neoplasm: Secondary | ICD-10-CM | POA: Insufficient documentation

## 2021-08-09 DIAGNOSIS — Z923 Personal history of irradiation: Secondary | ICD-10-CM | POA: Diagnosis not present

## 2021-08-09 DIAGNOSIS — C50411 Malignant neoplasm of upper-outer quadrant of right female breast: Secondary | ICD-10-CM | POA: Insufficient documentation

## 2021-08-09 DIAGNOSIS — Z510 Encounter for antineoplastic radiation therapy: Secondary | ICD-10-CM | POA: Diagnosis not present

## 2021-08-09 NOTE — Therapy (Signed)
[facility] @ [place], [place], [address] Phone: [phone]   Fax:  [phone]  Physical Therapy Treatment  Patient Details  Name: Nichole Ritter MRN: [id number] Date of Birth: [birth year] Referring Provider (PT): Dr. Erroll Luna   Encounter Date: 08/09/2021   PT End of Session - 08/09/21 0858     Visit Number 4    Number of Visits 9    Date for PT Re-Evaluation 08/16/21    PT Start Time 0800    PT Stop Time 0855    PT Time Calculation (min) 55 min    Activity Tolerance Patient tolerated treatment well    Behavior During Therapy [facility] for tasks assessed/performed             Past Medical History:  Diagnosis Date   Allergic rhinitis    Aortic atherosclerosis ([place])    Arthritis    Barrett esophagus    Barrett's esophagus    Bunion    Cancer ([place])    right breast [facility]   Family history of breast cancer    Family history of leukemia    Family history of lung cancer    Family history of ovarian cancer    Family history of prostate cancer    Female cystocele    GERD (gastroesophageal reflux disease)    History of epistaxis 2012   Hyperlipidemia    UNSPECIFIED   Lichen sclerosus    Obesity    Osteopenia    Personal history of colonic polyps    Rectocele    Situational stress    Stress incontinence    Vitamin D deficiency     Past Surgical History:  Procedure Laterality Date   BREAST LUMPECTOMY     BREAST LUMPECTOMY WITH RADIOACTIVE SEED AND SENTINEL LYMPH NODE BIOPSY Right 06/15/2021   Procedure: RIGHT BREAST LUMPECTOMY WITH RADIOACTIVE SEED X 2 AND SENTINEL LYMPH NODE BIOPSY;  Surgeon: Erroll Luna, MD;  Location: [facility];  Service: General;  Laterality: Right;   CATARACT EXTRACTION Bilateral 02/2018   03/2018   COLONOSCOPY  01/2013   LUMBAR DISC SURGERY     TUBAL LIGATION      There were no vitals filed for this visit.   Subjective Assessment - 08/09/21 0753      Subjective I sleep on my left side and I think I am getting some swelling in the medial breast. Radiation is going well.  I was really tired last week but I feel better this week..Skin is doing well so far.  I finished #12 yesterday with 8 more to go. I have beeen keeping a check on my shoulder ROM and it is doing well. Get occasional shooting pains in the breast 1-2 times per day.    Pertinent History Patient was diagnosed on 02/21/2021 with right invasive lobular carcinoma breast cancer. She underwent a right lumpectomy and sentinel node biopsy (1 negative node) on 06/15/2021. 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    Currently in Pain? No/denies    Pain Score 0-No pain                               [facility] Adult PT Treatment/Exercise - 08/09/21 0001       Manual Therapy   Manual therapy comments chip pack made to place over lumpectomy incisons to try and soften    Edema Management  Discussed flexi touch with pt. Pt is in agreement with me sending her demographic information    Soft tissue mobilization to lumpectomy incisions    Manual Lymphatic Drainage (MLD) had pt perform the following seqeunce: short neck 5 diaphragmatic breaths, left axillary nodes. anterior interaxillary anastamosis and right breast., right inguinal nodes and axillo-inguinal anastamosis and right breast in supine  - gave pt verbal cues as needed but overall she demonstrated good skin stretch and good speed, therapist completed MLD                          PT Long Term Goals - 07/19/21 0950       PT LONG TERM GOAL #1   Title pt will report decreased heaviness in right breat by 50%    Time 4    Period Weeks    Status New    Target Date 08/16/21      PT LONG TERM GOAL #2   Title pt will be independent with self breast MLD    Time 4    Period Weeks    Status New    Target Date 08/16/21      PT LONG TERM GOAL #3   Title Pt will attend ABC class     Time 4    Period Weeks    Status New    Target Date 08/16/21                   Plan - 08/09/21 0858     Clinical Impression Statement Pt continues with firmness around both breast incisions. She did well with sequence and technique with MLD and used good stretch.  She did require occasional VC's for technique but corrected easily.  Pt was instructed in scar massage to incision sites.  We discussed flexi touch and pt is in agreement with her demographics being sent. She will do ABC class on Monday    Personal Factors and Comorbidities Comorbidity 2    Comorbidities Right breast double lumpectomy with SLNB, radiation, hx of compression fx    Stability/Clinical Decision Making Stable/Uncomplicated    Rehab Potential Excellent    PT Frequency 2x / week    PT Duration 4 weeks    PT Treatment/Interventions ADLs/Self Care Home Management;Patient/family education;Manual techniques;Manual lymph drainage;Taping;Scar mobilization;Passive range of motion;Therapeutic exercise    PT Next Visit Plan reinforce teaching self MLD perform right breast MLD and instruct pt, see how chip pack did with firmness, review scar massage    PT Home Exercise Plan Post op HEP, self MLD    Consulted and Agree with Plan of Care Patient             Patient will benefit from skilled therapeutic intervention in order to improve the following deficits and impairments:  Increased edema, Postural dysfunction, Decreased scar mobility  Visit Diagnosis: Localized edema  Aftercare following surgery for neoplasm  Abnormal posture  Carcinoma of upper-outer quadrant of right female breast, unspecified estrogen receptor status ([place])     Problem List Patient Active Problem List   Diagnosis Date Noted   Genetic testing 05/20/2021   Family history of prostate cancer 05/10/2021   Family history of breast cancer 05/10/2021   Family history of ovarian cancer 05/10/2021   Family history of leukemia 05/10/2021    Family history of lung cancer 05/10/2021   Malignant neoplasm of upper-outer quadrant of right female breast ([place]) 05/05/2021   Aortic atherosclerosis ([place]) 01/06/2019  Mixed hyperlipidemia 01/06/2019   Family history of arteriosclerotic cardiovascular disease 01/06/2019    Claris Pong, PT 08/09/2021, 9:02 AM  [facility] @ [place], [place], [address] Phone: [phone]   Fax:  [phone]  Name: Nichole Ritter MRN: [id number] Date of Birth: [birth year]

## 2021-08-10 ENCOUNTER — Ambulatory Visit
Admission: RE | Admit: 2021-08-10 | Discharge: 2021-08-10 | Disposition: A | Discharge status: Home / Self Care | Payer: Medicare Other | Source: Ambulatory Visit | Attending: Radiation Oncology | Admitting: Radiation Oncology

## 2021-08-10 ENCOUNTER — Other Ambulatory Visit: Payer: Self-pay

## 2021-08-10 DIAGNOSIS — Z170 Estrogen receptor positive status [ER+]: Secondary | ICD-10-CM | POA: Diagnosis not present

## 2021-08-10 DIAGNOSIS — R293 Abnormal posture: Secondary | ICD-10-CM | POA: Diagnosis not present

## 2021-08-10 DIAGNOSIS — C50411 Malignant neoplasm of upper-outer quadrant of right female breast: Secondary | ICD-10-CM | POA: Diagnosis not present

## 2021-08-10 DIAGNOSIS — Z483 Aftercare following surgery for neoplasm: Secondary | ICD-10-CM | POA: Diagnosis not present

## 2021-08-10 DIAGNOSIS — Z510 Encounter for antineoplastic radiation therapy: Secondary | ICD-10-CM | POA: Diagnosis not present

## 2021-08-10 DIAGNOSIS — R600 Localized edema: Secondary | ICD-10-CM | POA: Diagnosis not present

## 2021-08-10 DIAGNOSIS — Z923 Personal history of irradiation: Secondary | ICD-10-CM | POA: Diagnosis not present

## 2021-08-11 ENCOUNTER — Ambulatory Visit: Payer: Medicare Other

## 2021-08-11 ENCOUNTER — Ambulatory Visit
Admission: RE | Admit: 2021-08-11 | Discharge: 2021-08-11 | Disposition: A | Discharge status: Home / Self Care | Payer: Medicare Other | Source: Ambulatory Visit | Attending: Radiation Oncology | Admitting: Radiation Oncology

## 2021-08-11 DIAGNOSIS — R293 Abnormal posture: Secondary | ICD-10-CM | POA: Diagnosis not present

## 2021-08-11 DIAGNOSIS — C50411 Malignant neoplasm of upper-outer quadrant of right female breast: Secondary | ICD-10-CM | POA: Diagnosis not present

## 2021-08-11 DIAGNOSIS — Z170 Estrogen receptor positive status [ER+]: Secondary | ICD-10-CM | POA: Diagnosis not present

## 2021-08-11 DIAGNOSIS — R600 Localized edema: Secondary | ICD-10-CM | POA: Diagnosis not present

## 2021-08-11 DIAGNOSIS — Z483 Aftercare following surgery for neoplasm: Secondary | ICD-10-CM | POA: Diagnosis not present

## 2021-08-11 DIAGNOSIS — Z923 Personal history of irradiation: Secondary | ICD-10-CM | POA: Diagnosis not present

## 2021-08-11 DIAGNOSIS — Z510 Encounter for antineoplastic radiation therapy: Secondary | ICD-10-CM | POA: Diagnosis not present

## 2021-08-14 ENCOUNTER — Ambulatory Visit
Admission: RE | Admit: 2021-08-14 | Discharge: 2021-08-14 | Disposition: A | Discharge status: Home / Self Care | Payer: Medicare Other | Source: Ambulatory Visit | Attending: Radiation Oncology | Admitting: Radiation Oncology

## 2021-08-14 ENCOUNTER — Other Ambulatory Visit: Payer: Self-pay

## 2021-08-14 DIAGNOSIS — Z923 Personal history of irradiation: Secondary | ICD-10-CM | POA: Diagnosis not present

## 2021-08-14 DIAGNOSIS — Z483 Aftercare following surgery for neoplasm: Secondary | ICD-10-CM | POA: Diagnosis not present

## 2021-08-14 DIAGNOSIS — C50411 Malignant neoplasm of upper-outer quadrant of right female breast: Secondary | ICD-10-CM | POA: Diagnosis not present

## 2021-08-14 DIAGNOSIS — R293 Abnormal posture: Secondary | ICD-10-CM | POA: Diagnosis not present

## 2021-08-14 DIAGNOSIS — Z510 Encounter for antineoplastic radiation therapy: Secondary | ICD-10-CM | POA: Diagnosis not present

## 2021-08-14 DIAGNOSIS — R600 Localized edema: Secondary | ICD-10-CM | POA: Diagnosis not present

## 2021-08-14 DIAGNOSIS — Z170 Estrogen receptor positive status [ER+]: Secondary | ICD-10-CM | POA: Diagnosis not present

## 2021-08-15 ENCOUNTER — Ambulatory Visit: Payer: Medicare Other

## 2021-08-15 ENCOUNTER — Ambulatory Visit
Admission: RE | Admit: 2021-08-15 | Discharge: 2021-08-15 | Disposition: A | Discharge status: Home / Self Care | Payer: Medicare Other | Source: Ambulatory Visit | Attending: Radiation Oncology | Admitting: Radiation Oncology

## 2021-08-15 ENCOUNTER — Other Ambulatory Visit: Payer: Self-pay

## 2021-08-15 DIAGNOSIS — Z510 Encounter for antineoplastic radiation therapy: Secondary | ICD-10-CM | POA: Diagnosis not present

## 2021-08-15 DIAGNOSIS — Z923 Personal history of irradiation: Secondary | ICD-10-CM | POA: Diagnosis not present

## 2021-08-15 DIAGNOSIS — R600 Localized edema: Secondary | ICD-10-CM | POA: Diagnosis not present

## 2021-08-15 DIAGNOSIS — R293 Abnormal posture: Secondary | ICD-10-CM

## 2021-08-15 DIAGNOSIS — C50411 Malignant neoplasm of upper-outer quadrant of right female breast: Secondary | ICD-10-CM | POA: Diagnosis not present

## 2021-08-15 DIAGNOSIS — Z483 Aftercare following surgery for neoplasm: Secondary | ICD-10-CM

## 2021-08-15 DIAGNOSIS — Z170 Estrogen receptor positive status [ER+]: Secondary | ICD-10-CM | POA: Diagnosis not present

## 2021-08-15 NOTE — Therapy (Signed)
[facility] @ [place], [place], [address] Phone: [phone]   Fax:  [phone]  Physical Therapy Treatment  Patient Details  Name: Nichole Ritter MRN: [id number] Date of Birth: [birth year] Referring Provider (PT): Dr. Erroll Luna   Encounter Date: 08/15/2021   PT End of Session - 08/15/21 0906     Visit Number 5    Number of Visits 9    Date for PT Re-Evaluation 08/16/21    PT Start Time 0806    PT Stop Time 0906    PT Time Calculation (min) 60 min    Activity Tolerance Patient tolerated treatment well    Behavior During Therapy [facility] for tasks assessed/performed             Past Medical History:  Diagnosis Date   Allergic rhinitis    Aortic atherosclerosis ([place])    Arthritis    Barrett esophagus    Barrett's esophagus    Bunion    Cancer ([place])    right breast [facility]   Family history of breast cancer    Family history of leukemia    Family history of lung cancer    Family history of ovarian cancer    Family history of prostate cancer    Female cystocele    GERD (gastroesophageal reflux disease)    History of epistaxis 2012   Hyperlipidemia    UNSPECIFIED   Lichen sclerosus    Obesity    Osteopenia    Personal history of colonic polyps    Rectocele    Situational stress    Stress incontinence    Vitamin D deficiency     Past Surgical History:  Procedure Laterality Date   BREAST LUMPECTOMY     BREAST LUMPECTOMY WITH RADIOACTIVE SEED AND SENTINEL LYMPH NODE BIOPSY Right 06/15/2021   Procedure: RIGHT BREAST LUMPECTOMY WITH RADIOACTIVE SEED X 2 AND SENTINEL LYMPH NODE BIOPSY;  Surgeon: Erroll Luna, MD;  Location: [facility];  Service: General;  Laterality: Right;   CATARACT EXTRACTION Bilateral 02/2018   03/2018   COLONOSCOPY  01/2013   LUMBAR DISC SURGERY     TUBAL LIGATION      There were no vitals filed for this visit.                       [facility] Adult PT Treatment/Exercise - 08/15/21 0001       Manual Therapy   Manual therapy comments Made large "U" shape out of 1/2" gray compression foam for pt to wear at inferior aspect of breast    Soft tissue mobilization to lumpectomy incisions    Manual Lymphatic Drainage (MLD) short neck 5 diaphragmatic breaths, left axillary nodes. anterior interaxillary anastamosis and right breast., right inguinal nodes and axillo-inguinal anastamosis and right breast in supine ; then into Lt S/L for further work at lateral breast redirecting to posterior inter-axillary and Rt axillo-inguinal anastomosis, then finished retracing all steps in supine continuing reviewing with pt throughout having her return demo. She is doing very well with her technique                     PT Education - 08/15/21 0809     Education Details Issued printout of exercises from [facility] class    Person(s) Educated Patient    Methods Explanation;Demonstration;Handout    Comprehension Verbalized understanding;Returned demonstration  PT Long Term Goals - 07/19/21 0950       PT LONG TERM GOAL #1   Title pt will report decreased heaviness in right breat by 50%    Time 4    Period Weeks    Status New    Target Date 08/16/21      PT LONG TERM GOAL #2   Title pt will be independent with self breast MLD    Time 4    Period Weeks    Status New    Target Date 08/16/21      PT LONG TERM GOAL #3   Title Pt will attend ABC class    Time 4    Period Weeks    Status New    Target Date 08/16/21                   Plan - 08/15/21 0907     Clinical Impression Statement Issued second piece of foam for pt today as she reports chip pack was working but moving the fluid to the inside of her breast. So trial of "U" shaped 1/2" gray compression foam in tubular stockinette for pt to wear in bra at inferior aspect of breast. Then continued with manual lymph drainage to Rt breast and added also  doing this in Lt S/L for pt to be able to reach lateral trunk/breast better. Good softening of fibrosis noted by end of session. Also printed ABC Class exercises for pt per her request.    Personal Factors and Comorbidities Comorbidity 2    Comorbidities Right breast double lumpectomy with SLNB, radiation, hx of compression fx    Stability/Clinical Decision Making Stable/Uncomplicated    Rehab Potential Excellent    PT Frequency 2x / week    PT Duration 4 weeks    PT Treatment/Interventions ADLs/Self Care Home Management;Patient/family education;Manual techniques;Manual lymph drainage;Taping;Scar mobilization;Passive range of motion;Therapeutic exercise    PT Next Visit Plan reinforce teaching self MLD perform right breast MLD and instruct pt, how was new foam?, review scar massage    PT Home Exercise Plan Post op HEP, self MLD, ABC Class exercises    Consulted and Agree with Plan of Care Patient             Patient will benefit from skilled therapeutic intervention in order to improve the following deficits and impairments:  Increased edema, Postural dysfunction, Decreased scar mobility  Visit Diagnosis: Localized edema  Aftercare following surgery for neoplasm  Abnormal posture  Carcinoma of upper-outer quadrant of right female breast, unspecified estrogen receptor status ([place])     Problem List Patient Active Problem List   Diagnosis Date Noted   Genetic testing 05/20/2021   Family history of prostate cancer 05/10/2021   Family history of breast cancer 05/10/2021   Family history of ovarian cancer 05/10/2021   Family history of leukemia 05/10/2021   Family history of lung cancer 05/10/2021   Malignant neoplasm of upper-outer quadrant of right female breast ([place]) 05/05/2021   Aortic atherosclerosis ([place]) 01/06/2019   Mixed hyperlipidemia 01/06/2019   Family history of arteriosclerotic cardiovascular disease 01/06/2019    Otelia Limes, PTA 08/15/2021, 9:25  AM  [facility] @ [place], [place], [address] Phone: [phone]   Fax:  [phone]  Name: Nichole Ritter MRN: [id number] Date of Birth: 1950/11/23

## 2021-08-15 NOTE — Patient Instructions (Signed)
Access Code: [email or web address] URL: [email or web address] Date: 08/15/2021 Prepared by: Collie Siad  Exercises Shoulder Flexion AAROM - 2 x daily - 7 x weekly - 10 reps - 2-3 hold Standing Shoulder Abduction AAROM with Dowel - 2 x daily - 7 x weekly - 10 reps - 2-3 hold Supine Chest Stretch with Elbows Bent - 2 x daily - 7 x weekly - 5 reps - 5 hold Standing Scapular Retraction - 2 x daily - 7 x weekly - 10 reps - 2-3 hold Standing Backward Shoulder Rolls - 2 x daily - 7 x weekly - 10 reps Standing Shoulder Abduction Slides at Wall - 2 x daily - 7 x weekly - 10 reps - 2-3 hold Standing shoulder flexion wall slides - 2 x daily - 7 x weekly - 10 reps - 2-3 hold [place] Stretch at 90 Degrees Abduction - 3 x daily - 7 x weekly - 3 reps - 20 hold Standing Shoulder Flexion to 90 Degrees - 1 x daily - 3 x weekly - 1 sets - 10 reps - 3 sec hold Standing Shoulder Scaption - 1 x daily - 3 x weekly - 1 sets - 10 reps - 3 sec hold Shoulder Abduction - Thumbs Up - 1 x daily - 3 x weekly - 1 sets - 10 reps - 3 sec hold

## 2021-08-16 ENCOUNTER — Ambulatory Visit
Admission: RE | Admit: 2021-08-16 | Discharge: 2021-08-16 | Disposition: A | Discharge status: Home / Self Care | Payer: Medicare Other | Source: Ambulatory Visit | Attending: Radiation Oncology | Admitting: Radiation Oncology

## 2021-08-16 DIAGNOSIS — R600 Localized edema: Secondary | ICD-10-CM | POA: Diagnosis not present

## 2021-08-16 DIAGNOSIS — Z923 Personal history of irradiation: Secondary | ICD-10-CM | POA: Diagnosis not present

## 2021-08-16 DIAGNOSIS — Z510 Encounter for antineoplastic radiation therapy: Secondary | ICD-10-CM | POA: Diagnosis not present

## 2021-08-16 DIAGNOSIS — R293 Abnormal posture: Secondary | ICD-10-CM | POA: Diagnosis not present

## 2021-08-16 DIAGNOSIS — C50411 Malignant neoplasm of upper-outer quadrant of right female breast: Secondary | ICD-10-CM | POA: Diagnosis not present

## 2021-08-16 DIAGNOSIS — Z483 Aftercare following surgery for neoplasm: Secondary | ICD-10-CM | POA: Diagnosis not present

## 2021-08-16 DIAGNOSIS — Z170 Estrogen receptor positive status [ER+]: Secondary | ICD-10-CM | POA: Diagnosis not present

## 2021-08-17 ENCOUNTER — Ambulatory Visit
Admission: RE | Admit: 2021-08-17 | Discharge: 2021-08-17 | Disposition: A | Discharge status: Home / Self Care | Payer: Medicare Other | Source: Ambulatory Visit | Attending: Radiation Oncology | Admitting: Radiation Oncology

## 2021-08-17 ENCOUNTER — Other Ambulatory Visit: Payer: Self-pay

## 2021-08-17 ENCOUNTER — Ambulatory Visit: Payer: Medicare Other

## 2021-08-17 DIAGNOSIS — Z483 Aftercare following surgery for neoplasm: Secondary | ICD-10-CM | POA: Diagnosis not present

## 2021-08-17 DIAGNOSIS — R293 Abnormal posture: Secondary | ICD-10-CM

## 2021-08-17 DIAGNOSIS — C50411 Malignant neoplasm of upper-outer quadrant of right female breast: Secondary | ICD-10-CM | POA: Diagnosis not present

## 2021-08-17 DIAGNOSIS — Z923 Personal history of irradiation: Secondary | ICD-10-CM | POA: Diagnosis not present

## 2021-08-17 DIAGNOSIS — Z170 Estrogen receptor positive status [ER+]: Secondary | ICD-10-CM | POA: Diagnosis not present

## 2021-08-17 DIAGNOSIS — R600 Localized edema: Secondary | ICD-10-CM

## 2021-08-17 DIAGNOSIS — Z510 Encounter for antineoplastic radiation therapy: Secondary | ICD-10-CM | POA: Diagnosis not present

## 2021-08-17 NOTE — Therapy (Signed)
[facility] @ [place], [place], [address] Phone: [phone]   Fax:  [phone]  Physical Therapy Treatment  Patient Details  Name: Nichole Ritter MRN: [id number] Date of Birth: 1950/12/03 Referring Provider (PT): Dr. Erroll Luna   Encounter Date: 08/17/2021   PT End of Session - 08/17/21 0907     Visit Number 6    Number of Visits 14    Date for PT Re-Evaluation 09/14/2021   PT Start Time 0806    PT Stop Time 0905    PT Time Calculation (min) 59 min    Activity Tolerance Patient tolerated treatment well    Behavior During Therapy [facility] for tasks assessed/performed             Past Medical History:  Diagnosis Date   Allergic rhinitis    Aortic atherosclerosis ([place])    Arthritis    Barrett esophagus    Barrett's esophagus    Bunion    Cancer ([place])    right breast [facility]   Family history of breast cancer    Family history of leukemia    Family history of lung cancer    Family history of ovarian cancer    Family history of prostate cancer    Female cystocele    GERD (gastroesophageal reflux disease)    History of epistaxis 2012   Hyperlipidemia    UNSPECIFIED   Lichen sclerosus    Obesity    Osteopenia    Personal history of colonic polyps    Rectocele    Situational stress    Stress incontinence    Vitamin D deficiency     Past Surgical History:  Procedure Laterality Date   BREAST LUMPECTOMY     BREAST LUMPECTOMY WITH RADIOACTIVE SEED AND SENTINEL LYMPH NODE BIOPSY Right 06/15/2021   Procedure: RIGHT BREAST LUMPECTOMY WITH RADIOACTIVE SEED X 2 AND SENTINEL LYMPH NODE BIOPSY;  Surgeon: Erroll Luna, MD;  Location: [facility];  Service: General;  Laterality: Right;   CATARACT EXTRACTION Bilateral 02/2018   03/2018   COLONOSCOPY  01/2013   LUMBAR DISC SURGERY     TUBAL LIGATION      There were no vitals filed for this visit.   Subjective Assessment - 08/17/21 0807      Subjective The firmness at the inside of my breast is a little better this morning. I think we are making progress. And the new foam you gave me is easier to wear.    Pertinent History Patient was diagnosed on 02/21/2021 with right invasive lobular carcinoma breast cancer. She underwent a right lumpectomy and sentinel node biopsy (1 negative node) on 06/15/2021. 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    Patient Stated Goals check breast swelling    Currently in Pain? No/denies                               [facility] Adult PT Treatment/Exercise - 08/17/21 0001       Manual Therapy   Soft tissue mobilization to lumpectomy incisions during MLD    Manual Lymphatic Drainage (MLD) short neck, superficial and deep abdominals, left axillary nodes. anterior interaxillary anastamosis and right breast, right intact upper quadrant sequence, right inguinal nodes and axillo-inguinal anastamosis and right breast in supine ; then into Lt S/L for further work at lateral breast redirecting to posterior inter-axillary and Rt axillo-inguinal  anastomosis, then finished retracing all steps in supine continuing reviewing with pt throughout having her return demo. She is doing very well with her technique                          PT Long Term Goals - 08/17/21 1054       PT LONG TERM GOAL #1   Title pt will report decreased heaviness in right breat by 50%    Baseline pt did not report this today but reports today was first time she has noticed this being less firm since starting therapy - 08/17/21    Status On-going      PT LONG TERM GOAL #2   Title pt will be independent with self breast MLD    Baseline Pt reports feeling confident with technique at this time but will benefit from further reinforcement and assessing her lymphedema over next few weeks working towards D/C-08/17/21    Status Partially Met      PT LONG TERM GOAL #3   Title Pt will  attend ABC class    Baseline Pt attended this 08/14/21 and able to answer her questions at following session - 08/17/21    Status Achieved                   Plan - 08/17/21 1037     Clinical Impression Statement Pt comes in wearing new compression foam issued at last session reporting this is more comfortable to wear chip pack and she hasn't noticed increased fluid at medial breast. In fact reports it has much improved as evidenced by it not feeling as firm and "lumpy" per pt report. Upon palpation during MLD it was much softer and smaller of an area today. Continued with MLD to Rt breast verbally answering pts questions about this whil performing. She is seeming to do very well with self management at this time. Renewal done today as pts dates have expired and she has a few more visits scheduled that she would like to keep so we can cont to assess her technique before D/C. Pt will also benefit from a few more visits as she has 3 more boost treatments with radiation that may negatively impact her swelling causing an increase and we will be able to support her through this as well.    Personal Factors and Comorbidities Comorbidity 2    Comorbidities Right breast double lumpectomy with SLNB, radiation, hx of compression fx    Stability/Clinical Decision Making Stable/Uncomplicated    Rehab Potential Excellent    PT Frequency 2x / week    PT Duration 4 weeks    PT Treatment/Interventions ADLs/Self Care Home Management;Patient/family education;Manual techniques;Manual lymph drainage;Taping;Scar mobilization;Passive range of motion;Therapeutic exercise    PT Next Visit Plan Renewal dont today; reinforce teaching self MLD perform right breast MLD and instruct pt, review scar massage prn    PT Home Exercise Plan Post op HEP, self MLD, ABC Class exercises    Consulted and Agree with Plan of Care Patient             Patient will benefit from skilled therapeutic intervention in order to  improve the following deficits and impairments:  Increased edema, Postural dysfunction, Decreased scar mobility  Visit Diagnosis: Localized edema  Aftercare following surgery for neoplasm  Abnormal posture  Carcinoma of upper-outer quadrant of right female breast, unspecified estrogen receptor status ([place])     Problem List Patient Active Problem List  Diagnosis Date Noted   Genetic testing 05/20/2021   Family history of prostate cancer 05/10/2021   Family history of breast cancer 05/10/2021   Family history of ovarian cancer 05/10/2021   Family history of leukemia 05/10/2021   Family history of lung cancer 05/10/2021   Malignant neoplasm of upper-outer quadrant of right female breast ([place]) 05/05/2021   Aortic atherosclerosis ([place]) 01/06/2019   Mixed hyperlipidemia 01/06/2019   Family history of arteriosclerotic cardiovascular disease 01/06/2019    Otelia Limes, PTA 08/17/2021, 11:03 AM  [facility] @ [place], [place], [address] Phone: [phone]   Fax:  [phone]  Name: Lacoya Wilbanks MRN: [id number] Date of Birth: Jun 23, 1951

## 2021-08-18 ENCOUNTER — Ambulatory Visit
Admission: RE | Admit: 2021-08-18 | Discharge: 2021-08-18 | Disposition: A | Discharge status: Home / Self Care | Payer: Medicare Other | Source: Ambulatory Visit | Attending: Radiation Oncology | Admitting: Radiation Oncology

## 2021-08-18 DIAGNOSIS — Z510 Encounter for antineoplastic radiation therapy: Secondary | ICD-10-CM | POA: Diagnosis not present

## 2021-08-18 DIAGNOSIS — Z923 Personal history of irradiation: Secondary | ICD-10-CM | POA: Diagnosis not present

## 2021-08-18 DIAGNOSIS — R600 Localized edema: Secondary | ICD-10-CM | POA: Diagnosis not present

## 2021-08-18 DIAGNOSIS — R293 Abnormal posture: Secondary | ICD-10-CM | POA: Diagnosis not present

## 2021-08-18 DIAGNOSIS — C50411 Malignant neoplasm of upper-outer quadrant of right female breast: Secondary | ICD-10-CM | POA: Diagnosis not present

## 2021-08-18 DIAGNOSIS — Z483 Aftercare following surgery for neoplasm: Secondary | ICD-10-CM | POA: Diagnosis not present

## 2021-08-18 DIAGNOSIS — Z170 Estrogen receptor positive status [ER+]: Secondary | ICD-10-CM | POA: Diagnosis not present

## 2021-08-19 NOTE — Progress Notes (Signed)
Patient Care Team: [name], MD as PCP - General (Family Medicine) Jerline Pain, MD as PCP - Cardiology (Cardiology) Mauro Kaufmann, RN as Oncology Nurse Navigator Rockwell Germany, RN as Oncology Nurse Navigator Erroll Luna, MD as Consulting Physician (General Surgery) Nicholas Lose, MD as Consulting Physician (Hematology and Oncology) Kyung Rudd, MD as Consulting Physician (Radiation Oncology)  DIAGNOSIS:    ICD-10-CM   1. Malignant neoplasm of upper-outer quadrant of right breast in female, estrogen receptor positive ([place])  C50.411    Z17.0       SUMMARY OF ONCOLOGIC HISTORY: Oncology History  Malignant neoplasm of upper-outer quadrant of right female breast ([place])  04/24/2021 Initial Diagnosis   Screening mammogram detected architectural distortion 9:00 measuring 0.9 cm biopsy revealed invasive mammary cancer with lobular features prognostic panel pending, right breast calcifications at 12:00 1.2 cm, biopsy showed DCIS with LCIS high-grade with necrosis and calcifications ER greater than 95%, PR 40%   05/10/2021 Cancer Staging   Staging form: Breast, AJCC 8th Edition - Clinical stage from 05/10/2021: cT1b, cN0, cM0, G1, ER+, PR+, HER2: Unknown - Signed by Nicholas Lose, MD on 05/10/2021 Stage prefix: Initial diagnosis Histologic grading system: 3 grade system Laterality: Right Staged by: Pathologist and managing physician Stage used in treatment planning: Yes National guidelines used in treatment planning: Yes Type of national guideline used in treatment planning: NCCN    05/20/2021 Genetic Testing   Negative genetic testing:  No pathogenic variants detected on the [facility] BRCAplus panel (report date 05/20/2021) or the CancerNext-Expanded + RNAinsight panel (report date 05/22/2021). 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  The BRCAplus panel offered by [employer] and includes sequencing and deletion/duplication  analysis for the following 8 genes: ATM, BRCA1, BRCA2, CDH1, CHEK2, PALB2, PTEN, and TP53. The CancerNext-Expanded + RNAinsight gene panel offered by [employer] and includes sequencing and rearrangement analysis for the following 77 genes: AIP, ALK, APC, ATM, AXIN2, BAP1, BARD1, BLM, BMPR1A, BRCA1, BRCA2, BRIP1, CDC73, CDH1, CDK4, CDKN1B, CDKN2A, CHEK2, CTNNA1, DICER1, FANCC, FH, FLCN, GALNT12, KIF1B, LZTR1, MAX, MEN1, MET, MLH1, MSH2, MSH3, MSH6, MUTYH, NBN, NF1, NF2, NTHL1, PALB2, PHOX2B, PMS2, POT1, PRKAR1A, PTCH1, PTEN, RAD51C, RAD51D, RB1, RECQL, RET, SDHA, SDHAF2, SDHB, SDHC, SDHD, SMAD4, SMARCA4, SMARCB1, SMARCE1, STK11, SUFU, TMEM127, TP53, TSC1, TSC2, VHL and XRCC2 (sequencing and deletion/duplication); EGFR, EGLN1, HOXB13, KIT, MITF, PDGFRA, POLD1 and POLE (sequencing only); EPCAM and GREM1 (deletion/duplication only). RNA data is routinely analyzed for use in variant interpretation for all genes.   06/30/2021 Oncotype testing   Oncotype DX: Recurrence score: 19, distant recurrence at 9 years: 6%   07/26/2021 - 08/21/2021 Radiation Therapy   Adjuvant radiation   09/12/2021 -  Anti-estrogen oral therapy   Adjuvant letrozole     CHIEF COMPLIANT: Follow-up after radiation therapy  INTERVAL HISTORY: Nichole Ritter is a 70 y.o. with above-mentioned history of right breast cancer having undergone right lumpectomy, currently on radiation therapy. She presents to the clinic today for follow-up.  She finished radiation therapy and is here today to discuss adjuvant treatment plan.  She is healing and recovering very well from the radiation.  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  ALLERGIES:  is allergic to caine-1 [lidocaine], esomeprazole magnesium, formaldehyde, and nickel.  MEDICATIONS:  Current Outpatient Medications  Medication Sig Dispense Refill   acetaminophen (TYLENOL) 325 MG tablet Take 650 mg by mouth every 6 (six) hours as needed.     atorvastatin (LIPITOR) 10 MG  tablet Take 10 mg  by mouth daily.     B Complex-C (SUPER B COMPLEX PO) Take 1 tablet by mouth daily.      betamethasone dipropionate (DIPROLENE) 0.05 % cream Apply 1 application topically 2 (two) times daily as needed.      Calcium-Magnesium-Vitamin D (CALCIUM 1200+D3 PO) Take 1 tablet by mouth daily.      Cholecalciferol (VITAMIN D3) 50 MCG (2000 UT) capsule Take 2,000 Units by mouth daily.     cycloSPORINE (RESTASIS) 0.05 % ophthalmic emulsion 1 drop 2 (two) times daily.     fluticasone (FLONASE) 50 MCG/ACT nasal spray Place into both nostrils daily.     ibuprofen (ADVIL) 800 MG tablet Take 1 tablet (800 mg total) by mouth every 8 (eight) hours as needed. (Patient not taking: Reported on 07/18/2021) 30 tablet 0   LUTEIN PO Take by mouth.     Multiple Vitamins-Minerals (CENTRUM SILVER 50+WOMEN PO) Take 1 tablet by mouth daily.      OMEPRAZOLE PO Take 20 mg by mouth.     predniSONE (DELTASONE) 10 MG tablet Take 2 tablets (20 mg total) by mouth daily. (Patient not taking: Reported on 07/18/2021) 14 tablet 0   zinc gluconate 50 MG tablet Take 50 mg by mouth daily.     No current facility-administered medications for this visit.    PHYSICAL EXAMINATION: ECOG PERFORMANCE STATUS: 1 - Symptomatic but completely ambulatory  Vitals:   08/21/21 1020  BP: 137/69  Pulse: 65  Resp: 18  Temp: 97.8 F (36.6 C)  SpO2: 98%   Filed Weights   08/21/21 1020  Weight: 191 lb 1.6 oz (86.7 kg)      LABORATORY DATA:  I have reviewed the data as listed CMP Latest Ref Rng & Units 05/10/2021 12/02/2018  Glucose 70 - 99 mg/dL 96 -  BUN 8 - 23 mg/dL 10 -  Creatinine 0.44 - 1.00 mg/dL 0.81 -  Sodium 135 - 145 mmol/L 142 -  Potassium 3.5 - 5.1 mmol/L 4.0 -  Chloride 98 - 111 mmol/L 105 -  CO2 22 - 32 mmol/L 28 -  Calcium 8.9 - 10.3 mg/dL 9.5 -  Total Protein 6.5 - 8.1 g/dL 7.4 -  Total Bilirubin 0.3 - 1.2 mg/dL 0.8 -  Alkaline Phos 38 - 126 U/L 81 -  AST 15 - 41 U/L 21 -  ALT 0 - 44 U/L 19 22    Lab Results   Component Value Date   WBC 6.6 05/10/2021   HGB 15.4 (H) 05/10/2021   HCT 44.5 05/10/2021   MCV 90.6 05/10/2021   PLT 326 05/10/2021   NEUTROABS 3.4 05/10/2021    ASSESSMENT & PLAN:  Malignant neoplasm of upper-outer quadrant of right female breast ([place]) 06/15/2021: Right superior lumpectomy: High-grade DCIS with necrosis, LCIS, margins negative Right lateral lumpectomy: Grade 2 invasive lobular carcinoma, 1.3 cm, margins negative, focal ADH, ALH, 0/1 lymph node negative, ER 80%, PR 60%, Ki-67 1%, HER2 negative 1+ Oncotype DX score: 19: Distant recurrence rate at 9 years: 6% Adjuvant radiation: Completed 08/21/2021  Treatment plan: Adjuvant letrozole 2.5 mg daily to start November 2022 Letrozole counseling: We discussed the risks and benefits of anti-estrogen therapy with aromatase inhibitors. These include but not limited to insomnia, hot flashes, mood changes, vaginal dryness, bone density loss, and weight gain. We strongly believe that the benefits far outweigh the risks. Patient understands these risks and consented to starting treatment. Planned treatment duration is 7 years.  Return to clinic in  3 months for survivorship care plan visit    No orders of the defined types were placed in this encounter.  The patient has a good understanding of the overall plan. she agrees with it. she will call with any problems that may develop before the next visit here.  Total time spent: 20 mins including face to face time and time spent for planning, charting and coordination of care  Rulon Eisenmenger, MD, MPH 08/21/2021  I, Thana Ates, am acting as scribe for Dr. Nicholas Lose.  I have reviewed the above documentation for accuracy and completeness, and I agree with the above.

## 2021-08-21 ENCOUNTER — Inpatient Hospital Stay: Payer: Medicare Other | Admitting: Hematology and Oncology

## 2021-08-21 ENCOUNTER — Encounter: Payer: Self-pay | Admitting: Radiation Oncology

## 2021-08-21 ENCOUNTER — Encounter: Payer: Self-pay | Admitting: *Deleted

## 2021-08-21 ENCOUNTER — Other Ambulatory Visit: Payer: Self-pay

## 2021-08-21 ENCOUNTER — Ambulatory Visit
Admission: RE | Admit: 2021-08-21 | Discharge: 2021-08-21 | Disposition: A | Discharge status: Home / Self Care | Payer: Medicare Other | Source: Ambulatory Visit | Attending: Radiation Oncology | Admitting: Radiation Oncology

## 2021-08-21 DIAGNOSIS — Z923 Personal history of irradiation: Secondary | ICD-10-CM | POA: Insufficient documentation

## 2021-08-21 DIAGNOSIS — C50411 Malignant neoplasm of upper-outer quadrant of right female breast: Secondary | ICD-10-CM | POA: Insufficient documentation

## 2021-08-21 DIAGNOSIS — R293 Abnormal posture: Secondary | ICD-10-CM | POA: Diagnosis not present

## 2021-08-21 DIAGNOSIS — Z170 Estrogen receptor positive status [ER+]: Secondary | ICD-10-CM

## 2021-08-21 DIAGNOSIS — Z483 Aftercare following surgery for neoplasm: Secondary | ICD-10-CM | POA: Diagnosis not present

## 2021-08-21 DIAGNOSIS — Z510 Encounter for antineoplastic radiation therapy: Secondary | ICD-10-CM | POA: Diagnosis not present

## 2021-08-21 DIAGNOSIS — R600 Localized edema: Secondary | ICD-10-CM | POA: Diagnosis not present

## 2021-08-21 MED ORDER — LETROZOLE 2.5 MG PO TABS: 2.5000 mg | ORAL_TABLET | Freq: Every day | ORAL | 3 refills | Status: DC

## 2021-08-21 NOTE — Assessment & Plan Note (Signed)
06/15/2021: Right superior lumpectomy: High-grade DCIS with necrosis, LCIS, margins negative Right lateral lumpectomy: Grade 2 invasive lobular carcinoma, 1.3 cm, margins negative, focal ADH, ALH, 0/1 lymph node negative, ER 80%, PR 60%, Ki-67 1%, HER2 negative 1+ Oncotype DX score: 19: Distant recurrence rate at 9 years: 6% Adjuvant radiation: Completed 08/21/2021  Treatment plan: Adjuvant letrozole 2.5 mg daily to start November 2022 Letrozole counseling: We discussed the risks and benefits of anti-estrogen therapy with aromatase inhibitors. These include but not limited to insomnia, hot flashes, mood changes, vaginal dryness, bone density loss, and weight gain. We strongly believe that the benefits far outweigh the risks. Patient understands these risks and consented to starting treatment. Planned treatment duration is 7 years.  Return to clinic in 3 months for survivorship care plan visit

## 2021-08-23 ENCOUNTER — Other Ambulatory Visit: Payer: Self-pay

## 2021-08-23 ENCOUNTER — Ambulatory Visit: Payer: Medicare Other

## 2021-08-23 DIAGNOSIS — Z483 Aftercare following surgery for neoplasm: Secondary | ICD-10-CM

## 2021-08-23 DIAGNOSIS — R600 Localized edema: Secondary | ICD-10-CM | POA: Diagnosis not present

## 2021-08-23 DIAGNOSIS — C50411 Malignant neoplasm of upper-outer quadrant of right female breast: Secondary | ICD-10-CM

## 2021-08-23 DIAGNOSIS — R293 Abnormal posture: Secondary | ICD-10-CM | POA: Diagnosis not present

## 2021-08-23 DIAGNOSIS — Z510 Encounter for antineoplastic radiation therapy: Secondary | ICD-10-CM | POA: Diagnosis not present

## 2021-08-23 DIAGNOSIS — Z170 Estrogen receptor positive status [ER+]: Secondary | ICD-10-CM | POA: Diagnosis not present

## 2021-08-23 DIAGNOSIS — Z923 Personal history of irradiation: Secondary | ICD-10-CM | POA: Diagnosis not present

## 2021-08-23 NOTE — Patient Instructions (Signed)
SHOULDER: Flexion - Supine (Cane)        Cancer Rehab [phone]    Hold cane in both hands. Raise arms up overhead. Do not allow back to arch. Hold _5__ seconds. Do __5-10__ times; __1-2__ times a day.  Hands shoulder width 2.  Hands slightly wider than shoulder width  Shoulder Blade Stretch    Clasp fingers behind head with elbows touching in front of face. Pull elbows back while pressing shoulder blades together. Relax and hold as tolerated, can place pillow under elbow here for comfort as needed and to allow for prolonged stretch.  Repeat __5__ times. Do __1-2__ sessions per day.   Over Head Pull: Narrow and Wide Grip   Cancer Rehab [phone]   On back, knees bent, feet flat, band across thighs, elbows straight but relaxed. Pull hands apart (start). Keeping elbows straight, bring arms up and over head, hands toward floor. Keep pull steady on band. Hold momentarily. Return slowly, keeping pull steady, back to start. Then do same with a wider grip on the band (past shoulder width) Repeat _5-10__ times. Band color __yellow____   Side Pull: Double Arm   On back, knees bent, feet flat. Arms perpendicular to body, shoulder level, elbows straight but relaxed. Pull arms out to sides, elbows straight. Resistance band comes across collarbones, hands toward floor. Hold momentarily. Slowly return to starting position. Repeat _5-10__ times. Band color _yellow____       Shoulder Rotation: Double Arm   On back, knees bent, feet flat, elbows tucked at sides, bent 90, hands palms up. Pull hands apart and down toward floor, keeping elbows near sides. Hold momentarily. Slowly return to starting position. Repeat _5-10__ times. Band color __yellow____       Copyright  VHI. All rights reserved.

## 2021-08-23 NOTE — Therapy (Signed)
[facility] @ [address] [place], [place], [address] Phone: [phone]   Fax:  [phone]  Physical Therapy Treatment  Patient Details  Name: Nichole Ritter MRN: [id number] Date of Birth: Nov 23, 1950 Referring Provider (PT): Dr. Erroll Luna   Encounter Date: 08/23/2021   PT End of Session - 08/23/21 0814     Visit Number 7    Number of Visits 14    Date for PT Re-Evaluation 09/14/21    PT Start Time 0757    PT Stop Time 0845    PT Time Calculation (min) 48 min    Activity Tolerance Patient tolerated treatment well    Behavior During Therapy [facility] for tasks assessed/performed             Past Medical History:  Diagnosis Date   Allergic rhinitis    Aortic atherosclerosis ([place])    Arthritis    Barrett esophagus    Barrett's esophagus    Bunion    Cancer ([place])    right breast [facility]   Family history of breast cancer    Family history of leukemia    Family history of lung cancer    Family history of ovarian cancer    Family history of prostate cancer    Female cystocele    GERD (gastroesophageal reflux disease)    History of epistaxis 2012   Hyperlipidemia    UNSPECIFIED   Lichen sclerosus    Obesity    Osteopenia    Personal history of colonic polyps    Rectocele    Situational stress    Stress incontinence    Vitamin D deficiency     Past Surgical History:  Procedure Laterality Date   BREAST LUMPECTOMY     BREAST LUMPECTOMY WITH RADIOACTIVE SEED AND SENTINEL LYMPH NODE BIOPSY Right 06/15/2021   Procedure: RIGHT BREAST LUMPECTOMY WITH RADIOACTIVE SEED X 2 AND SENTINEL LYMPH NODE BIOPSY;  Surgeon: Erroll Luna, MD;  Location: [facility];  Service: General;  Laterality: Right;   CATARACT EXTRACTION Bilateral 02/2018   03/2018   COLONOSCOPY  01/2013   LUMBAR DISC SURGERY     TUBAL LIGATION      There were no vitals filed for this visit.   Subjective Assessment - 08/23/21 0755      Subjective THe dermatitis from radiation is worse now.  I started to get little blisters on Saturday and Monday. I have not worn the foam the last few days.  I am putting some cortisone on it. The nipple is more tender since radiation. Intermittent shooting pains that go away quickly    Pertinent History Patient was diagnosed on 02/21/2021 with right invasive lobular carcinoma breast cancer. She underwent a right lumpectomy and sentinel node biopsy (1 negative node) on 06/15/2021. 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    Patient Stated Goals check breast swelling    Currently in Pain? No/denies    Pain Score 0-No pain                               [facility] Adult PT Treatment/Exercise - 08/23/21 0001       Shoulder Exercises: Supine   Horizontal ABduction Strengthening;Both;10 reps    Theraband Level (Shoulder Horizontal ABduction) Level 1 (Yellow)    External Rotation Strengthening;Both;10 reps    Theraband Level (Shoulder External Rotation) Level 1 (Yellow)    Flexion  Strengthening;Both;10 reps    Theraband Level (Shoulder Flexion) Level 1 (Yellow)    Other Supine Exercises Supine wand flexion, scaption x 5, stargazer x 5      Manual Therapy   Manual therapy comments pts right breast with significant redness/contact dermatitis and will need to hold on MLD at this time. pt to cancel next Tuesday if not resolved    Passive ROM PROM right shoulder flexion, scaption, abduction, ER                          PT Long Term Goals - 08/17/21 1054       PT LONG TERM GOAL #1   Title pt will report decreased heaviness in right breat by 50%    Baseline pt did not report this today but reports today was first time she has noticed this being less firm since starting therapy - 08/17/21    Status On-going      PT LONG TERM GOAL #2   Title pt will be independent with self breast MLD    Baseline Pt reports feeling confident with technique  at this time but will benefit from further reinforcement and assessing her lymphedema over next few weeks working towards D/C-08/17/21    Status Partially Met      PT LONG TERM GOAL #3   Title Pt will attend ABC class    Baseline Pt attended this 08/14/21 and able to answer her questions at following session - 08/17/21    Status Achieved                   Plan - 08/23/21 1638     Clinical Impression Statement Pt came in and indicated that her right breast has significant contact dermatitis and is very red.  Breast was checked and entire breast was affected.  Held MLD today and pt will cancel next Tuesday if still having Redness/discomfort/itchiness.  She indicated her right shoulder has been feeling tight since last few radiation treatments  so worked on [employer] and added 3 supine scapular series exercises.  PROM was also performed to the right shoulder with multiple VC's to relax arm. Shoulder was more limber after treatment.  She did start to experience some left shoulder pain after a a few reps with supine scaption so held and advised pt to discontinue any exercise if it causes pain.    Personal Factors and Comorbidities Comorbidity 2    Comorbidities Right breast double lumpectomy with SLNB, radiation, hx of compression fx    Stability/Clinical Decision Making Stable/Uncomplicated    Rehab Potential Excellent    PT Frequency 2x / week    PT Duration 4 weeks    PT Treatment/Interventions ADLs/Self Care Home Management;Patient/family education;Manual techniques;Manual lymph drainage;Taping;Scar mobilization;Passive range of motion;Therapeutic exercise    PT Next Visit Plan Assess right breast contact dermatitis and if healed reinforce teaching self MLD perform right breast MLD and instruct pt, review scar massage prn    PT Home Exercise Plan Post op HEP, self MLD, ABC Class exercises, Supine wand flex and scaption, supine scap series except diagonals    Consulted and Agree with  Plan of Care Patient             Patient will benefit from skilled therapeutic intervention in order to improve the following deficits and impairments:  Increased edema, Postural dysfunction, Decreased scar mobility  Visit Diagnosis: Localized edema  Aftercare following surgery for neoplasm  Abnormal  posture  Carcinoma of upper-outer quadrant of right female breast, unspecified estrogen receptor status ([place])     Problem List Patient Active Problem List   Diagnosis Date Noted   Genetic testing 05/20/2021   Family history of prostate cancer 05/10/2021   Family history of breast cancer 05/10/2021   Family history of ovarian cancer 05/10/2021   Family history of leukemia 05/10/2021   Family history of lung cancer 05/10/2021   Malignant neoplasm of upper-outer quadrant of right female breast ([place]) 05/05/2021   Aortic atherosclerosis ([place]) 01/06/2019   Mixed hyperlipidemia 01/06/2019   Family history of arteriosclerotic cardiovascular disease 01/06/2019    Claris Pong, PT 08/23/2021, 8:55 AM  [facility] @ [address] [place], [place], [address] Phone: [phone]   Fax:  [phone]  Name: Lucina Betty MRN: [id number] Date of Birth: [birth year]

## 2021-08-27 NOTE — Progress Notes (Signed)
                                                                                                                                                             Patient Name: Nichole Ritter MRN: [id number] DOB: Mar 06, 1951 Referring Physician: [name] (Profile Not Attached) Date of Service: 08/21/2021 [facility], [place]                                                        End Of Treatment Note  Diagnoses: C50.411-Malignant neoplasm of upper-outer quadrant of right female breast  Cancer Staging: Stage IA, pT1c, cN0M0, grade 2 invasive, ER/PR positive lobular carcinoma with associated high grade ER/PR positive DCIS of the right breast.  Intent: Curative  Radiation Treatment Dates: 07/25/2021 through 08/21/2021 Site Technique Total Dose (Gy) Dose per Fx (Gy) Completed Fx Beam Energies  Breast, Right: Breast_Rt 3D 42.56/42.56 2.66 16/16 10X  Breast, Right: Breast_Rt_Bst 3D 8/8 2 4/4 10X   Narrative: The patient tolerated radiation therapy relatively well. She developed fatigue and anticipated skin changes in the treatment field.   Plan: The patient will receive a call in about one month from the radiation oncology department. She will continue follow up with Dr. Lindi Adie as well.   ________________________________________________    Carola Rhine, [facility]

## 2021-09-04 DIAGNOSIS — C50911 Malignant neoplasm of unspecified site of right female breast: Secondary | ICD-10-CM | POA: Diagnosis not present

## 2021-09-11 DIAGNOSIS — Z23 Encounter for immunization: Secondary | ICD-10-CM | POA: Diagnosis not present

## 2021-09-11 DIAGNOSIS — Z0000 Encounter for general adult medical examination without abnormal findings: Secondary | ICD-10-CM | POA: Diagnosis not present

## 2021-09-11 DIAGNOSIS — E785 Hyperlipidemia, unspecified: Secondary | ICD-10-CM | POA: Diagnosis not present

## 2021-09-11 DIAGNOSIS — C50411 Malignant neoplasm of upper-outer quadrant of right female breast: Secondary | ICD-10-CM | POA: Diagnosis not present

## 2021-09-11 DIAGNOSIS — E559 Vitamin D deficiency, unspecified: Secondary | ICD-10-CM | POA: Diagnosis not present

## 2021-09-11 DIAGNOSIS — L900 Lichen sclerosus et atrophicus: Secondary | ICD-10-CM | POA: Diagnosis not present

## 2021-09-11 DIAGNOSIS — M5136 Other intervertebral disc degeneration, lumbar region: Secondary | ICD-10-CM | POA: Diagnosis not present

## 2021-09-11 DIAGNOSIS — R7303 Prediabetes: Secondary | ICD-10-CM | POA: Diagnosis not present

## 2021-09-11 DIAGNOSIS — M8588 Other specified disorders of bone density and structure, other site: Secondary | ICD-10-CM | POA: Diagnosis not present

## 2021-09-11 DIAGNOSIS — L989 Disorder of the skin and subcutaneous tissue, unspecified: Secondary | ICD-10-CM | POA: Diagnosis not present

## 2021-09-18 ENCOUNTER — Ambulatory Visit
Admission: RE | Admit: 2021-09-18 | Discharge: 2021-09-18 | Disposition: A | Discharge status: Home / Self Care | Payer: Medicare Other | Source: Ambulatory Visit | Attending: Hematology and Oncology | Admitting: Hematology and Oncology

## 2021-09-18 DIAGNOSIS — Z170 Estrogen receptor positive status [ER+]: Secondary | ICD-10-CM

## 2021-09-18 DIAGNOSIS — C50411 Malignant neoplasm of upper-outer quadrant of right female breast: Secondary | ICD-10-CM

## 2021-09-19 DIAGNOSIS — M4696 Unspecified inflammatory spondylopathy, lumbar region: Secondary | ICD-10-CM | POA: Diagnosis not present

## 2021-09-19 NOTE — Progress Notes (Signed)
  Radiation Oncology         [phone] ________________________________  Name: Nichole Ritter MRN: [id number]  Date of Service: 09/18/2021  DOB: February 02, 1951  Post Treatment Telephone Note  Diagnosis:   Stage IA, pT1c, cN0M0, grade 2 invasive, ER/PR positive lobular carcinoma with associated high grade ER/PR positive DCIS of the right breast.  Interval Since Last Radiation:  4 weeks   07/25/2021 through 08/21/2021 Site Technique Total Dose (Gy) Dose per Fx (Gy) Completed Fx Beam Energies  Breast, Right: Breast_Rt 3D 42.56/42.56 2.66 16/16 10X  Breast, Right: Breast_Rt_Bst 3D 8/8 2 4/4 10X    Narrative:  The patient was contacted today for routine follow-up. During treatment she did very well with radiotherapy and did not have significant desquamation. She reports she felt like her skin has improved and she's been able to continue to exercise.  Impression/Plan: 1. Stage IA, pT1c, cN0M0, grade 2 invasive, ER/PR positive lobular carcinoma with associated high grade ER/PR positive DCIS of the right breast.. The patient has been doing well since completion of radiotherapy. We discussed that we would be happy to continue to follow her as needed, but she will also continue to follow up with Dr. Lindi Adie in medical oncology. She was counseled on skin care as well as measures to avoid sun exposure to this area.  2. Survivorship. We discussed the importance of survivorship evaluation and encouraged her to attend her upcoming visit with that clinic.     Carola Rhine, PAC

## 2021-09-25 ENCOUNTER — Ambulatory Visit: Payer: Medicare Other

## 2021-09-29 DIAGNOSIS — N61 Mastitis without abscess: Secondary | ICD-10-CM | POA: Diagnosis not present

## 2021-10-04 DIAGNOSIS — N611 Abscess of the breast and nipple: Secondary | ICD-10-CM | POA: Diagnosis not present

## 2021-10-04 DIAGNOSIS — N644 Mastodynia: Secondary | ICD-10-CM | POA: Diagnosis not present

## 2021-10-04 DIAGNOSIS — R928 Other abnormal and inconclusive findings on diagnostic imaging of breast: Secondary | ICD-10-CM | POA: Diagnosis not present

## 2021-11-16 ENCOUNTER — Telehealth: Payer: Self-pay | Admitting: *Deleted

## 2021-11-20 ENCOUNTER — Inpatient Hospital Stay: Payer: Medicare Other | Attending: Radiation Oncology | Admitting: Adult Health

## 2021-11-20 ENCOUNTER — Encounter: Payer: Self-pay | Admitting: Adult Health

## 2021-11-20 ENCOUNTER — Other Ambulatory Visit: Payer: Self-pay

## 2021-11-20 VITALS — BP 142/66 | HR 64 | Temp 97.7°F | Resp 18 | Ht 63.0 in | Wt 187.0 lb

## 2021-11-20 DIAGNOSIS — R5383 Other fatigue: Secondary | ICD-10-CM | POA: Diagnosis not present

## 2021-11-20 DIAGNOSIS — R600 Localized edema: Secondary | ICD-10-CM | POA: Insufficient documentation

## 2021-11-20 DIAGNOSIS — Z823 Family history of stroke: Secondary | ICD-10-CM | POA: Diagnosis not present

## 2021-11-20 DIAGNOSIS — Z806 Family history of leukemia: Secondary | ICD-10-CM | POA: Diagnosis not present

## 2021-11-20 DIAGNOSIS — Z8249 Family history of ischemic heart disease and other diseases of the circulatory system: Secondary | ICD-10-CM | POA: Insufficient documentation

## 2021-11-20 DIAGNOSIS — Z8042 Family history of malignant neoplasm of prostate: Secondary | ICD-10-CM | POA: Diagnosis not present

## 2021-11-20 DIAGNOSIS — Z170 Estrogen receptor positive status [ER+]: Secondary | ICD-10-CM | POA: Insufficient documentation

## 2021-11-20 DIAGNOSIS — Z832 Family history of diseases of the blood and blood-forming organs and certain disorders involving the immune mechanism: Secondary | ICD-10-CM | POA: Insufficient documentation

## 2021-11-20 DIAGNOSIS — Z803 Family history of malignant neoplasm of breast: Secondary | ICD-10-CM | POA: Insufficient documentation

## 2021-11-20 DIAGNOSIS — C50411 Malignant neoplasm of upper-outer quadrant of right female breast: Secondary | ICD-10-CM | POA: Diagnosis not present

## 2021-11-20 DIAGNOSIS — Z8041 Family history of malignant neoplasm of ovary: Secondary | ICD-10-CM | POA: Diagnosis not present

## 2021-11-20 DIAGNOSIS — Z79899 Other long term (current) drug therapy: Secondary | ICD-10-CM | POA: Insufficient documentation

## 2021-11-20 DIAGNOSIS — Z833 Family history of diabetes mellitus: Secondary | ICD-10-CM | POA: Diagnosis not present

## 2021-11-20 DIAGNOSIS — R4584 Anhedonia: Secondary | ICD-10-CM | POA: Insufficient documentation

## 2021-11-20 DIAGNOSIS — Z8719 Personal history of other diseases of the digestive system: Secondary | ICD-10-CM | POA: Insufficient documentation

## 2021-11-20 DIAGNOSIS — Z801 Family history of malignant neoplasm of trachea, bronchus and lung: Secondary | ICD-10-CM | POA: Diagnosis not present

## 2021-11-20 MED ORDER — ANASTROZOLE 1 MG PO TABS: 1.0000 mg | ORAL_TABLET | Freq: Every day | ORAL | 3 refills | Status: DC

## 2021-11-20 NOTE — Progress Notes (Signed)
SURVIVORSHIP VISIT:  BRIEF ONCOLOGIC HISTORY:  Oncology History  Malignant neoplasm of upper-outer quadrant of right female breast ([place])  04/24/2021 Initial Diagnosis   Screening mammogram detected architectural distortion 9:00 measuring 0.9 cm biopsy revealed invasive mammary cancer with lobular features prognostic panel pending, right breast calcifications at 12:00 1.2 cm, biopsy showed DCIS with LCIS high-grade with necrosis and calcifications ER greater than 95%, PR 40%   05/10/2021 Cancer Staging   Staging form: Breast, AJCC 8th Edition - Clinical stage from 05/10/2021: cT1b, cN0, cM0, G1, ER+, PR+, HER2: Unknown - Signed by Nicholas Lose, MD on 05/10/2021 Stage prefix: Initial diagnosis Histologic grading system: 3 grade system Laterality: Right Staged by: Pathologist and managing physician Stage used in treatment planning: Yes National guidelines used in treatment planning: Yes Type of national guideline used in treatment planning: NCCN    05/20/2021 Genetic Testing   Negative genetic testing:  No pathogenic variants detected on the [facility] BRCAplus panel (report date 05/20/2021) or the CancerNext-Expanded + RNAinsight panel (report date 05/22/2021). 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  The BRCAplus panel offered by [employer] and includes sequencing and deletion/duplication analysis for the following 8 genes: ATM, BRCA1, BRCA2, CDH1, CHEK2, PALB2, PTEN, and TP53. The CancerNext-Expanded + RNAinsight gene panel offered by [employer] and includes sequencing and rearrangement analysis for the following 77 genes: AIP, ALK, APC, ATM, AXIN2, BAP1, BARD1, BLM, BMPR1A, BRCA1, BRCA2, BRIP1, CDC73, CDH1, CDK4, CDKN1B, CDKN2A, CHEK2, CTNNA1, DICER1, FANCC, FH, FLCN, GALNT12, KIF1B, LZTR1, MAX, MEN1, MET, MLH1, MSH2, MSH3, MSH6, MUTYH, NBN, NF1, NF2, NTHL1, PALB2, PHOX2B, PMS2, POT1, PRKAR1A, PTCH1, PTEN, RAD51C, RAD51D, RB1, RECQL, RET, SDHA,  SDHAF2, SDHB, SDHC, SDHD, SMAD4, SMARCA4, SMARCB1, SMARCE1, STK11, SUFU, TMEM127, TP53, TSC1, TSC2, VHL and XRCC2 (sequencing and deletion/duplication); EGFR, EGLN1, HOXB13, KIT, MITF, PDGFRA, POLD1 and POLE (sequencing only); EPCAM and GREM1 (deletion/duplication only). RNA data is routinely analyzed for use in variant interpretation for all genes.   06/15/2021 Definitive Surgery   FINAL MICROSCOPIC DIAGNOSIS:   A. BREAST, RIGHT SUPERIOR, LUMPECTOMY:  - Ductal carcinoma in situ, high-grade with necrosis and calcifications  - Lobular carcinoma in situ  - No evidence of invasive carcinoma  - Resection margins are negative for DCIS  - Biopsy site changes  - See oncology table   B. BREAST, RIGHT LATERAL, LUMPECTOMY:  - Invasive lobular carcinoma, 1.3 cm, grade 2, see comment  - Resection margins are negative for carcinoma  - Focal atypical ductal hyperplasia  - Atypical lobular hyperplasia  - Fibrocystic change with calcifications  - Biopsy site changes   C. BREAST, RIGHT LATERAL ADDITIONAL ANTEROMEDIAL MARGIN, EXCISION:  - Benign breast parenchyma, negative for carcinoma   D. BREAST, RIGHT LATERAL ADDITIONAL INFERIOR/LATERAL MARGIN, EXCISION:  - Benign breast parenchyma, negative for carcinoma   E. BREAST, RIGHT LATERAL ADDITIONAL POSTERIOR/SUPERIOR MARGIN, EXCISION:  - Benign breast parenchyma, negative for carcinoma   F. LYMPH NODE, RIGHT AXILLARY, SENTINEL, EXCISION:  - Lymph node, negative for carcinoma (0/1), see comment   COMMENT:  B and F.   Immunohistochemical stain for E-cadherin is negative in the  tumor cells, consistent with a lobular phenotype.  Immunostain for  cytokeratin AE1/AE3 performed on part F is negative for metastatic  carcinoma.   ADDENDUM:  B5.  PROGNOSTIC INDICATOR RESULTS:  The tumor cells are NEGATIVE for Her2 (1+).  Estrogen Receptor: POSITIVE, 80% MODERATE STAINING INTENSITY  Progesterone Receptor: POSITIVE, 60% MODERATE STAINING INTENSITY   Proliferation Marker Ki-67:  1%    06/30/2021 Oncotype testing   Oncotype DX: Recurrence score: 19, distant recurrence at 9 years: 6%   07/26/2021 - 08/21/2021 Radiation Therapy   Adjuvant radiation   09/12/2021 -  Anti-estrogen oral therapy   Adjuvant letrozole     INTERVAL HISTORY:  Nichole Ritter to review her survivorship care plan detailing her treatment course for breast cancer, as well as monitoring long-term side effects of that treatment, education regarding health maintenance, screening, and overall wellness and health promotion.     Overall, Nichole Ritter reports feeling moderately well.  She does note that she has experienced a little bit more anhedonia in the past month.  She wonders if she is experiencing this due to the letrozole.  She would like to stop taking the letrozole and take a different antiestrogen pill.  She notes that her right breast is still healing from her infection that she experienced last month.  She will follow-up with Dr. Brantley Stage about this tomorrow.  REVIEW OF SYSTEMS:  Review of Systems  Constitutional:  Positive for fatigue (Mild). Negative for appetite change, chills, fever and unexpected weight change.  HENT:   Negative for hearing loss, lump/mass and trouble swallowing.   Eyes:  Negative for eye problems and icterus.  Respiratory:  Negative for chest tightness, cough and shortness of breath.   Cardiovascular:  Negative for chest pain, leg swelling and palpitations.  Gastrointestinal:  Negative for abdominal distention, abdominal pain, constipation, diarrhea, nausea and vomiting.  Endocrine: Negative for hot flashes.  Genitourinary:  Negative for difficulty urinating.   Musculoskeletal:  Negative for arthralgias.  Skin:  Negative for itching and rash.  Neurological:  Negative for dizziness, extremity weakness, headaches and numbness.  Hematological:  Negative for adenopathy. Does not bruise/bleed easily.  Psychiatric/Behavioral:  Negative for depression.  The patient is not nervous/anxious.   Breast: Denies any new nodularity, masses, tenderness, nipple changes, or nipple discharge.      ONCOLOGY TREATMENT TEAM:  1. Surgeon:  Dr. Brantley Stage at [facility] 2. Medical Oncologist: Dr. Lindi Adie  3. Radiation Oncologist: Dr. Lisbeth Renshaw    PAST MEDICAL/SURGICAL HISTORY:  Past Medical History:  Diagnosis Date   Allergic rhinitis    Aortic atherosclerosis ([place])    Arthritis    Barrett esophagus    Barrett's esophagus    Bunion    Cancer ([place])    right breast [facility]   Family history of breast cancer    Family history of leukemia    Family history of lung cancer    Family history of ovarian cancer    Family history of prostate cancer    Female cystocele    GERD (gastroesophageal reflux disease)    History of epistaxis 2012   Hyperlipidemia    UNSPECIFIED   Lichen sclerosus    Obesity    Osteopenia    Personal history of colonic polyps    Rectocele    Situational stress    Stress incontinence    Vitamin D deficiency    Past Surgical History:  Procedure Laterality Date   BREAST LUMPECTOMY     BREAST LUMPECTOMY WITH RADIOACTIVE SEED AND SENTINEL LYMPH NODE BIOPSY Right 06/15/2021   Procedure: RIGHT BREAST LUMPECTOMY WITH RADIOACTIVE SEED X 2 AND SENTINEL LYMPH NODE BIOPSY;  Surgeon: Erroll Luna, MD;  Location: [place];  Service: General;  Laterality: Right;   CATARACT EXTRACTION Bilateral 02/2018   03/2018   COLONOSCOPY  01/2013   LUMBAR DISC SURGERY  TUBAL LIGATION       ALLERGIES:  Allergies  Allergen Reactions   Caine-1 [Lidocaine]    Esomeprazole Magnesium Diarrhea   Formaldehyde Rash   Nickel Rash     CURRENT MEDICATIONS:  Outpatient Encounter Medications as of 11/20/2021  Medication Sig   anastrozole (ARIMIDEX) 1 MG tablet Take 1 tablet (1 mg total) by mouth daily.   acetaminophen (TYLENOL) 325 MG tablet Take 650 mg by mouth every 6 (six) hours as needed.   atorvastatin (LIPITOR) 10  MG tablet Take 10 mg by mouth daily.   B Complex-C (SUPER B COMPLEX PO) Take 1 tablet by mouth daily.    betamethasone dipropionate (DIPROLENE) 0.05 % cream Apply 1 application topically 2 (two) times daily as needed.    Calcium-Magnesium-Vitamin D (CALCIUM 1200+D3 PO) Take 1 tablet by mouth daily.    Cholecalciferol (VITAMIN D3) 50 MCG (2000 UT) capsule Take 2,000 Units by mouth daily.   cycloSPORINE (RESTASIS) 0.05 % ophthalmic emulsion 1 drop 2 (two) times daily.   fluticasone (FLONASE) 50 MCG/ACT nasal spray Place into both nostrils daily.   LUTEIN PO Take by mouth.   Multiple Vitamins-Minerals (CENTRUM SILVER 50+WOMEN PO) Take 1 tablet by mouth daily.    OMEPRAZOLE PO Take 20 mg by mouth.   zinc gluconate 50 MG tablet Take 50 mg by mouth daily.   [DISCONTINUED] letrozole (FEMARA) 2.5 MG tablet Take 1 tablet (2.5 mg total) by mouth daily.   No facility-administered encounter medications on file as of 11/20/2021.     ONCOLOGIC FAMILY HISTORY:  Family History  Problem Relation Age of Onset   Diabetes Mother    Hypertension Mother    Breast cancer Mother 75   Stroke Mother    Polycythemia Father        dx 67s   Lung cancer Sister 101       hx smoking   Leukemia Brother 74       acute   Prostate cancer Brother 77   Cancer Maternal Aunt        unknown type, dx >50   Heart attack Paternal Uncle    Cancer Cousin        multiple maternal cousins w/cancer - unknown types   Breast cancer Niece        dx 41s, metastatic   Ovarian cancer Niece        dx 30s/40s   Prostate cancer Nephew        dx 40s/50s     GENETIC COUNSELING/TESTING: See above  SOCIAL HISTORY:  Social History   Socioeconomic History   Marital status: Married    Spouse name: Not on file   Number of children: Not on file   Years of education: Not on file   Highest education level: Not on file  Occupational History   Not on file  Tobacco Use   Smoking status: Never   Smokeless tobacco: Never  Vaping  Use   Vaping Use: Never used  Substance and Sexual Activity   Alcohol use: Yes    Comment: rare   Drug use: Never   Sexual activity: Not Currently    Birth control/protection: Surgical  Other Topics Concern   Not on file  Social History Narrative   Not on file   Social Determinants of Health   Financial Resource Strain: Low Risk    Difficulty of Paying Living Expenses: Not hard at all  Food Insecurity: No Food Insecurity   Worried About Charity fundraiser  in the Last Year: Never true   [place] in the Last Year: Never true  Transportation Needs: No Transportation Needs   Lack of Transportation (Medical): No   Lack of Transportation (Non-Medical): No  Physical Activity: Insufficiently Active   Days of Exercise per Week: 3 days   Minutes of Exercise per Session: 30 min  Stress: No Stress Concern Present   Feeling of Stress : Not at all  Social Connections: Moderately Isolated   Frequency of Communication with Friends and Family: Three times a week   Frequency of Social Gatherings with Friends and Family: Twice a week   Attends Religious Services: Never   Marine scientist or Organizations: No   Attends Music therapist: Never   Marital Status: Married  Human resources officer Violence: Not At Risk   Fear of Current or Ex-Partner: No   Emotionally Abused: No   Physically Abused: No   Sexually Abused: No     OBSERVATIONS/OBJECTIVE:  BP (!) 142/66 (BP Location: Left Arm, Patient Position: Sitting)    Pulse 64    Temp 97.7 F (36.5 C) (Temporal)    Resp 18    Ht _0  (1.6 m)    Wt 187 lb (84.8 kg)    SpO2 99%    BMI 33.13 kg/m  GENERAL: Patient is a well appearing female in no acute distress HEENT:  Sclerae anicteric.  Oropharynx clear and moist. No ulcerations or evidence of oropharyngeal candidiasis. Neck is supple.  NODES:  No cervical, supraclavicular, or axillary lymphadenopathy palpated.  BREAST EXAM: Deferred since she is seeing Dr. Brantley Stage  tomorrow LUNGS:  Clear to auscultation bilaterally.  No wheezes or rhonchi. HEART:  Regular rate and rhythm. No murmur appreciated. ABDOMEN:  Soft, nontender.  Positive, normoactive bowel sounds. No organomegaly palpated. MSK:  No focal spinal tenderness to palpation. Full range of motion bilaterally in the upper extremities. EXTREMITIES:  No peripheral edema.   SKIN:  Clear with no obvious rashes or skin changes. No nail dyscrasia. NEURO:  Nonfocal. Well oriented.  Appropriate affect.   LABORATORY DATA:  None for this visit.  DIAGNOSTIC IMAGING:  None for this visit.      ASSESSMENT AND PLAN:  Nichole Ritter is a pleasant 71 y.o. 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  She presents to the Survivorship Clinic for our initial meeting and routine follow-up post-completion of treatment for breast cancer.    1. Stage IA right breast cancer:  Nichole Ritter is continuing to recover from definitive treatment for breast cancer. She will follow-up with her medicHer mammogram is due 02/2022; orders placed today. Today, a comprehensive survivorship care plan and treatment summary was reviewed with the patient today detailing her breast cancer diagnosis, treatment course, potential late/long-term effects of treatment, appropriate follow-up care with recommendations for the future, and patient education resources.  A copy of this summary, along with a letter will be sent to the patients primary care provider via mail/fax/In Basket message after todays visit.    2 anhedonia: We will stop the letrozole.  She will not take this for 2 weeks.  I sent in anastrozole for her to try when she gets the letrozole out of her system.  She knows if not improved to call me back and let me know so I can send her for appropriate follow-up.  3. Bone health:  Given Nichole Ritter's age/history of breast cancer and her current treatment regimen including anti-estrogen therapy with Letrozole, she is at risk for bone demineralization.  She is undergone bone density testing, however we do not have a copy of this and 1 is on the way.  This is managed with her primary care provider.  She was given education on specific activities to promote bone health.  4. Cancer screening:  Due to Nichole Ritter's history and her age, she should receive screening for skin cancers, colon cancer.  The information and recommendations are listed on the patient's comprehensive care plan/treatment summary and were reviewed in detail with the patient.    5. Health maintenance and wellness promotion: Nichole Ritter was encouraged to consume 5-7 servings of fruits and vegetables per day. We reviewed the "Nutrition Rainbow" handout.  She was also encouraged to engage in moderate to vigorous exercise for 30 minutes per day most days of the week. We discussed the [employer] fitness program, which is designed for cancer survivors to help them become more physically fit after cancer treatments.  She was instructed to limit her alcohol consumption and continue to abstain from tobacco use.     6. Support services/counseling: It is not uncommon for this period of the patient's cancer care trajectory to be one of many emotions and stressors. She was given information regarding our available services and encouraged to contact me with any questions or for help enrolling in any of our support group/programs.    Follow up instructions:    -Return to cancer center in 6 months for f/u with Dr. Lindi Adie  -Mammogram due in 02/2022 -Follow up with surgery in one year -She is welcome to return back to the Survivorship Clinic at any time; no additional follow-up needed at this time.  -Consider referral back to survivorship as a long-term survivor for continued surveillance  The patient was provided an opportunity to  ask questions and all were answered. The patient agreed with the plan and demonstrated an understanding of the instructions.   Total encounter time: 30 minutes face-to-face visit time, chart review, lab review, order entry, care coordination, and documentation of the encounter.    Wilber Bihari, NP 11/20/21 1:52 PM Medical Oncology and Hematology [facility] [place], [place] [address] Tel. [phone]    Fax. [phone]  *Total Encounter Time as defined by the Centers for Medicare and Medicaid Services includes, in addition to the face-to-face time of a patient visit (documented in the note above) non-face-to-face time: obtaining and reviewing outside history, ordering and reviewing medications, tests or procedures, care coordination (communications with other health care professionals or caregivers) and documentation in the medical record.

## 2021-11-21 DIAGNOSIS — N61 Mastitis without abscess: Secondary | ICD-10-CM | POA: Diagnosis not present

## 2021-11-21 DIAGNOSIS — Z170 Estrogen receptor positive status [ER+]: Secondary | ICD-10-CM | POA: Diagnosis not present

## 2021-11-21 DIAGNOSIS — C50411 Malignant neoplasm of upper-outer quadrant of right female breast: Secondary | ICD-10-CM | POA: Diagnosis not present

## 2021-12-14 ENCOUNTER — Emergency Department (HOSPITAL_COMMUNITY): Payer: Medicare Other

## 2021-12-14 ENCOUNTER — Emergency Department (HOSPITAL_COMMUNITY)
Admission: EM | Admit: 2021-12-14 | Discharge: 2021-12-14 | Disposition: A | Discharge status: Home / Self Care | Payer: Medicare Other | Attending: Emergency Medicine | Admitting: Emergency Medicine

## 2021-12-14 DIAGNOSIS — R0602 Shortness of breath: Secondary | ICD-10-CM | POA: Insufficient documentation

## 2021-12-14 DIAGNOSIS — R0789 Other chest pain: Secondary | ICD-10-CM | POA: Diagnosis not present

## 2021-12-14 DIAGNOSIS — Z5321 Procedure and treatment not carried out due to patient leaving prior to being seen by health care provider: Secondary | ICD-10-CM | POA: Diagnosis not present

## 2021-12-14 DIAGNOSIS — R079 Chest pain, unspecified: Secondary | ICD-10-CM | POA: Diagnosis not present

## 2021-12-14 DIAGNOSIS — Z743 Need for continuous supervision: Secondary | ICD-10-CM | POA: Diagnosis not present

## 2021-12-14 DIAGNOSIS — I499 Cardiac arrhythmia, unspecified: Secondary | ICD-10-CM | POA: Diagnosis not present

## 2021-12-14 DIAGNOSIS — R6889 Other general symptoms and signs: Secondary | ICD-10-CM | POA: Diagnosis not present

## 2021-12-14 LAB — CBC
HCT: 42.4 % (ref 36.0–46.0)
Hemoglobin: 14.6 g/dL (ref 12.0–15.0)
MCH: 31.7 pg (ref 26.0–34.0)
MCHC: 34.4 g/dL (ref 30.0–36.0)
MCV: 92.2 fL (ref 80.0–100.0)
Platelets: 336 10*3/uL (ref 150–400)
RBC: 4.6 MIL/uL (ref 3.87–5.11)
RDW: 13.4 % (ref 11.5–15.5)
WBC: 11.1 10*3/uL — ABNORMAL HIGH (ref 4.0–10.5)
nRBC: 0 % (ref 0.0–0.2)

## 2021-12-14 LAB — COMPREHENSIVE METABOLIC PANEL
ALT: 20 U/L (ref 0–44)
AST: 28 U/L (ref 15–41)
Albumin: 3.8 g/dL (ref 3.5–5.0)
Alkaline Phosphatase: 74 U/L (ref 38–126)
Anion gap: 11 (ref 5–15)
BUN: 10 mg/dL (ref 8–23)
CO2: 22 mmol/L (ref 22–32)
Calcium: 8.9 mg/dL (ref 8.9–10.3)
Chloride: 102 mmol/L (ref 98–111)
Creatinine, Ser: 0.86 mg/dL (ref 0.44–1.00)
GFR, Estimated: >60 mL/min (ref >60)
Glucose, Bld: 123 mg/dL — ABNORMAL HIGH (ref 70–99)
Potassium: 3.1 mmol/L — ABNORMAL LOW (ref 3.5–5.1)
Sodium: 135 mmol/L (ref 135–145)
Total Bilirubin: 0.7 mg/dL (ref 0.3–1.2)
Total Protein: 6.7 g/dL (ref 6.5–8.1)

## 2021-12-14 LAB — TROPONIN I (HIGH SENSITIVITY): Troponin I (High Sensitivity): 9 ng/L (ref <18)

## 2021-12-14 NOTE — ED Notes (Signed)
PT opting to leave at this time 

## 2021-12-14 NOTE — ED Triage Notes (Signed)
Pt from home via [facility], sudden onset 8/10 CP worse w inspiration x1hr. Denies cardiac hx, compliant w all home medication  VSS, EKG unremarkable 2NTG w no improvement to pain, 324 ASA PTA 20g L hand

## 2021-12-14 NOTE — ED Provider Triage Note (Signed)
Has beenEmergency Medicine Provider Triage Evaluation Note  Nichole Ritter , a 71 y.o. female  was evaluated in triage.  Pt complains of chest pain.  She states that same occurred suddenly around 4 PM this evening and constant since then.  Pain is located in the center of her chest and does not radiate.  States that it is worse when she takes a deep breath and that she does feel short of breath as well.  Denies any nausea or vomiting.  No history of cardiac events.  Review of Systems  Positive: Chest pain, shortness of breath Negative: Nausea, vomiting  Physical Exam  BP (!) 125/55    Pulse 64    Temp 98.3 F (36.8 C) (Oral)    Resp (!) 22    SpO2 100%  Gen:   Awake, no distress   Resp:  Normal effort MSK:   Moves extremities without difficulty  Other:    Medical Decision Making  Medically screening exam initiated at 7:01 PM.  Appropriate orders placed.  Nichole Ritter was informed that the remainder of the evaluation will be completed by another provider, this initial triage assessment does not replace that evaluation, and the importance of remaining in the ED until their evaluation is complete.     Nestor Lewandowsky 12/14/21 1903

## 2022-01-15 ENCOUNTER — Telehealth: Payer: Self-pay | Admitting: *Deleted

## 2022-01-15 NOTE — Telephone Encounter (Signed)
Received call from pt requesting to stop Anastrozole due to increase in dizziness since taking the medication.  Per MD pt to stop for 2 weeks and f/u in office to see if symptoms resolve.  Pt educated and verbalized understanding.  ?

## 2022-01-16 ENCOUNTER — Telehealth: Payer: Self-pay | Admitting: Adult Health

## 2022-01-16 NOTE — Telephone Encounter (Signed)
.  Called patient to schedule appointment per 3/20 inbasket, patient is aware of date and time.   ?

## 2022-01-22 NOTE — Progress Notes (Signed)
[facility] Follow up: ?  ? ?[name], MD ?[address] ?[place] [address] ? ? ?DIAGNOSIS:  Cancer Staging  ?Malignant neoplasm of upper-outer quadrant of right female breast ([place]) ?Staging form: Breast, AJCC 8th Edition ?- Clinical stage from 05/10/2021: cT1b, cN0, cM0, G1, ER+, PR+, HER2: Unknown - Signed by Nicholas Lose, MD on 05/10/2021 ?Stage prefix: Initial diagnosis ?Histologic grading system: 3 grade system ?Laterality: Right ?Staged by: Pathologist and managing physician ?Stage used in treatment planning: Yes ?National guidelines used in treatment planning: Yes ?Type of national guideline used in treatment planning: NCCN ?- Pathologic stage from 07/18/2021: No Stage Recommended (pT1c, cN0, cM0, G2, ER+, PR+, HER2-, Oncotype DX score: 19) - Unsigned ?Method of lymph node assessment: Clinical ?Multigene prognostic tests performed: Oncotype DX ?Recurrence score range: Greater than or equal to 11 ?Histologic grading system: 3 grade system ? ? ?SUMMARY OF ONCOLOGIC HISTORY: ?Oncology History  ?Malignant neoplasm of upper-outer quadrant of right female breast ([place])  ?04/24/2021 Initial Diagnosis  ? Screening mammogram detected architectural distortion 9:00 measuring 0.9 cm biopsy revealed invasive mammary cancer with lobular features prognostic panel pending, right breast calcifications at 12:00 1.2 cm, biopsy showed DCIS with LCIS high-grade with necrosis and calcifications ER greater than 95%, PR 40% ?  ?05/10/2021 Cancer Staging  ? Staging form: Breast, AJCC 8th Edition ?- Clinical stage from 05/10/2021: cT1b, cN0, cM0, G1, ER+, PR+, HER2: Unknown - Signed by Nicholas Lose, MD on 05/10/2021 ?Stage prefix: Initial diagnosis ?Histologic grading system: 3 grade system ?Laterality: Right ?Staged by: Pathologist and managing physician ?Stage used in treatment planning: Yes ?National guidelines used in treatment planning: Yes ?Type of national guideline used in treatment  planning: NCCN ? ?  ?05/20/2021 Genetic Testing  ? Negative genetic testing:  No pathogenic variants detected on the [employer] BRCAplus panel (report date 05/20/2021) or the CancerNext-Expanded + RNAinsight panel (report date 05/22/2021). 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 ? ?The BRCAplus panel offered by [employer] and includes sequencing and deletion/duplication analysis for the following 8 genes: ATM, BRCA1, BRCA2, CDH1, CHEK2, PALB2, PTEN, and TP53. The CancerNext-Expanded + RNAinsight gene panel offered by [employer] and includes sequencing and rearrangement analysis for the following 77 genes: AIP, ALK, APC, ATM, AXIN2, BAP1, BARD1, BLM, BMPR1A, BRCA1, BRCA2, BRIP1, CDC73, CDH1, CDK4, CDKN1B, CDKN2A, CHEK2, CTNNA1, DICER1, FANCC, FH, FLCN, GALNT12, KIF1B, LZTR1, MAX, MEN1, MET, MLH1, MSH2, MSH3, MSH6, MUTYH, NBN, NF1, NF2, NTHL1, PALB2, PHOX2B, PMS2, POT1, PRKAR1A, PTCH1, PTEN, RAD51C, RAD51D, RB1, RECQL, RET, SDHA, SDHAF2, SDHB, SDHC, SDHD, SMAD4, SMARCA4, SMARCB1, SMARCE1, STK11, SUFU, TMEM127, TP53, TSC1, TSC2, VHL and XRCC2 (sequencing and deletion/duplication); EGFR, EGLN1, HOXB13, KIT, MITF, PDGFRA, POLD1 and POLE (sequencing only); EPCAM and GREM1 (deletion/duplication only). RNA data is routinely analyzed for use in variant interpretation for all genes. ?  ?06/15/2021 Definitive Surgery  ? FINAL MICROSCOPIC DIAGNOSIS:  ? ?A. BREAST, RIGHT SUPERIOR, LUMPECTOMY:  ?- Ductal carcinoma in situ, high-grade with necrosis and calcifications  ?- Lobular carcinoma in situ  ?- No evidence of invasive carcinoma  ?- Resection margins are negative for DCIS  ?- Biopsy site changes  ?- See oncology table  ? ?B. BREAST, RIGHT LATERAL, LUMPECTOMY:  ?- Invasive lobular carcinoma, 1.3 cm, grade 2, see comment  ?- Resection margins are negative for carcinoma  ?- Focal atypical ductal hyperplasia  ?- Atypical lobular hyperplasia  ?- Fibrocystic change with  calcifications  ?- Biopsy site  changes  ? ?C. BREAST, RIGHT LATERAL ADDITIONAL ANTEROMEDIAL MARGIN, EXCISION:  ?- Benign breast parenchyma, negative for carcinoma  ? ?D. BREAST, RIGHT LATERAL ADDITIONAL INFERIOR/LATERAL MARGIN, EXCISION:  ?- Benign breast parenchyma, negative for carcinoma  ? ?E. BREAST, RIGHT LATERAL ADDITIONAL POSTERIOR/SUPERIOR MARGIN, EXCISION:  ?- Benign breast parenchyma, negative for carcinoma  ? ?F. LYMPH NODE, RIGHT AXILLARY, SENTINEL, EXCISION:  ?- Lymph node, negative for carcinoma (0/1), see comment  ? ?COMMENT:  ?B and F.   Immunohistochemical stain for E-cadherin is negative in the  ?tumor cells, consistent with a lobular phenotype.  Immunostain for  ?cytokeratin AE1/AE3 performed on part F is negative for metastatic  ?carcinoma.  ? ?ADDENDUM:  ?B5.  PROGNOSTIC INDICATOR RESULTS:  ?The tumor cells are NEGATIVE for Her2 (1+).  ?Estrogen Receptor: POSITIVE, 80% MODERATE STAINING INTENSITY  ?Progesterone Receptor: POSITIVE, 60% MODERATE STAINING INTENSITY  ?Proliferation Marker Ki-67: 1%  ?  ?06/30/2021 Oncotype testing  ? Oncotype DX: Recurrence score: 19, distant recurrence at 9 years: 6% ?  ?07/26/2021 - 08/21/2021 Radiation Therapy  ? Adjuvant radiation ?  ?09/12/2021 -  Anti-estrogen oral therapy  ? Adjuvant letrozole ?  ? ? ?CURRENT THERAPY: Anastrozole daily ? ?INTERVAL HISTORY: ?Nichole Ritter 71 y.o. female returns for evaluation of side effects from taking Anastrozole.  She notes that she is nervous to take the anastrozole because of the side effects.  She has not been feeling well since November.  She says that the anastrozole side effect includes dizziness and she is concerned about osteopenia.  He has in her back and hip.  She is very concerned about making these issues worse with taking the anastrozole. ? ?She also has had continued right breast complaints with swelling and erythema.  She says that her breast mimics a lot of what the book educated her on around how breast cancer  appears and she is concerned she might have a recurrence.  She wants to get to the bottom of these 2 things and really wants to start feeling better before starting antiestrogen therapy. ? ?Patient Active Problem List  ? Diagnosis Date Noted  ? Genetic testing 05/20/2021  ? Family history of prostate cancer 05/10/2021  ? Family history of breast cancer 05/10/2021  ? Family history of ovarian cancer 05/10/2021  ? Family history of leukemia 05/10/2021  ? Family history of lung cancer 05/10/2021  ? Malignant neoplasm of upper-outer quadrant of right female breast ([place]) 05/05/2021  ? Aortic atherosclerosis ([place]) 01/06/2019  ? Mixed hyperlipidemia 01/06/2019  ? Family history of arteriosclerotic cardiovascular disease 01/06/2019  ? ? ?is allergic to caine-1 [lidocaine], esomeprazole magnesium, formaldehyde, and nickel. ? ?MEDICAL HISTORY: ?Past Medical History:  ?Diagnosis Date  ? Allergic rhinitis   ? Aortic atherosclerosis ([place])   ? Arthritis   ? Barrett esophagus   ? Barrett's esophagus   ? Bunion   ? Cancer [facility])   ? right breast IMC  ? Family history of breast cancer   ? Family history of leukemia   ? Family history of lung cancer   ? Family history of ovarian cancer   ? Family history of prostate cancer   ? Female cystocele   ? GERD (gastroesophageal reflux disease)   ? History of epistaxis 2012  ? Hyperlipidemia   ? UNSPECIFIED  ? Lichen sclerosus   ? Obesity   ? Osteopenia   ? Personal history of colonic polyps   ? Rectocele   ? Situational stress   ? Stress incontinence   ?  Vitamin D deficiency   ? ? ?SURGICAL HISTORY: ?Past Surgical History:  ?Procedure Laterality Date  ? BREAST LUMPECTOMY    ? BREAST LUMPECTOMY WITH RADIOACTIVE SEED AND SENTINEL LYMPH NODE BIOPSY Right 06/15/2021  ? Procedure: RIGHT BREAST LUMPECTOMY WITH RADIOACTIVE SEED X 2 AND SENTINEL LYMPH NODE BIOPSY;  Surgeon: Erroll Luna, MD;  Location: [place];  Service: General;  Laterality: Right;  ? CATARACT EXTRACTION  Bilateral 02/2018  ? 03/2018  ? COLONOSCOPY  01/2013  ? LUMBAR DISC SURGERY    ? TUBAL LIGATION    ? ? ?SOCIAL HISTORY: ?Social History  ? ?Socioeconomic History  ? Marital status: Married  ?  Spouse name: Not on file  ? [place]

## 2022-01-22 NOTE — Assessment & Plan Note (Addendum)
06/15/2021: Right superior lumpectomy: High-grade DCIS with necrosis, LCIS, margins negative ?Right lateral lumpectomy: Grade 2 invasive lobular carcinoma, 1.3 cm, margins negative, focal ADH, ALH, 0/1 lymph node negative, ER 80%, PR 60%, Ki-67 1%, HER2 negative 1+ ?Oncotype DX score: 19: Distant recurrence rate at 9 years: 6% ?Adjuvant radiation: Completed 08/21/2021 ? ?Treatment plan: Adjuvant letrozole 2.5 mg daily beginning November 2022, difficulty tolerating,stopped in 10/2021 ? ?Nichole Ritter has not yet started Anastrozole and isn't sure if she wants to start it.  We discussed in detail her risk of recurrence outside her breast if she does not take anti-estrogen therapy and she verbalized understanding. ? ?I am concerned that her breast may be infected, and she has an increasing painful seroma.  I prescribed Augmentin BID.  I also requested ultrasound breast aspiration at [facility].  I asked my nurse to get Nichole Ritter f/u with Dr. Brantley Stage next week.  ? ?We will see [name] back in 4 weeks to re-discuss antiestrogen therapy.   ?

## 2022-01-23 ENCOUNTER — Inpatient Hospital Stay: Payer: Medicare Other | Attending: Radiation Oncology | Admitting: Adult Health

## 2022-01-23 ENCOUNTER — Telehealth: Payer: Self-pay

## 2022-01-23 ENCOUNTER — Encounter: Payer: Self-pay | Admitting: Adult Health

## 2022-01-23 ENCOUNTER — Other Ambulatory Visit: Payer: Self-pay

## 2022-01-23 VITALS — BP 135/62 | HR 63 | Temp 98.1°F | Resp 18 | Ht 63.0 in | Wt 185.0 lb

## 2022-01-23 DIAGNOSIS — Z832 Family history of diseases of the blood and blood-forming organs and certain disorders involving the immune mechanism: Secondary | ICD-10-CM | POA: Insufficient documentation

## 2022-01-23 DIAGNOSIS — Z803 Family history of malignant neoplasm of breast: Secondary | ICD-10-CM | POA: Insufficient documentation

## 2022-01-23 DIAGNOSIS — I700 Atherosclerosis of aorta: Secondary | ICD-10-CM | POA: Insufficient documentation

## 2022-01-23 DIAGNOSIS — Z801 Family history of malignant neoplasm of trachea, bronchus and lung: Secondary | ICD-10-CM | POA: Insufficient documentation

## 2022-01-23 DIAGNOSIS — Z8041 Family history of malignant neoplasm of ovary: Secondary | ICD-10-CM | POA: Insufficient documentation

## 2022-01-23 DIAGNOSIS — Z8719 Personal history of other diseases of the digestive system: Secondary | ICD-10-CM | POA: Insufficient documentation

## 2022-01-23 DIAGNOSIS — Z833 Family history of diabetes mellitus: Secondary | ICD-10-CM | POA: Insufficient documentation

## 2022-01-23 DIAGNOSIS — Z806 Family history of leukemia: Secondary | ICD-10-CM | POA: Diagnosis not present

## 2022-01-23 DIAGNOSIS — Z823 Family history of stroke: Secondary | ICD-10-CM | POA: Insufficient documentation

## 2022-01-23 DIAGNOSIS — Z8249 Family history of ischemic heart disease and other diseases of the circulatory system: Secondary | ICD-10-CM | POA: Diagnosis not present

## 2022-01-23 DIAGNOSIS — Z79811 Long term (current) use of aromatase inhibitors: Secondary | ICD-10-CM | POA: Diagnosis not present

## 2022-01-23 DIAGNOSIS — Z170 Estrogen receptor positive status [ER+]: Secondary | ICD-10-CM | POA: Diagnosis not present

## 2022-01-23 DIAGNOSIS — R42 Dizziness and giddiness: Secondary | ICD-10-CM | POA: Insufficient documentation

## 2022-01-23 DIAGNOSIS — C50411 Malignant neoplasm of upper-outer quadrant of right female breast: Secondary | ICD-10-CM | POA: Insufficient documentation

## 2022-01-23 DIAGNOSIS — M8580 Other specified disorders of bone density and structure, unspecified site: Secondary | ICD-10-CM | POA: Insufficient documentation

## 2022-01-23 DIAGNOSIS — Z8042 Family history of malignant neoplasm of prostate: Secondary | ICD-10-CM | POA: Insufficient documentation

## 2022-01-23 MED ORDER — AMOXICILLIN-POT CLAVULANATE 875-125 MG PO TABS: 1.0000 | ORAL_TABLET | Freq: Two times a day (BID) | ORAL | 0 refills | Status: DC

## 2022-01-23 NOTE — Telephone Encounter (Signed)
Called and left VM advising that this Probation officer had called and spoken with Abigail Butts in Triage at [facility]. Previously scheduled apt with Dr. Brantley Stage is 02/19/22. They will speak with MD to see if patient can be sooner. They will call pt to discuss. ?

## 2022-01-24 ENCOUNTER — Telehealth: Payer: Self-pay | Admitting: Hematology and Oncology

## 2022-01-24 NOTE — Telephone Encounter (Signed)
Scheduled appointment per 3/28 los. Patient is aware. ?

## 2022-01-26 DIAGNOSIS — R928 Other abnormal and inconclusive findings on diagnostic imaging of breast: Secondary | ICD-10-CM | POA: Diagnosis not present

## 2022-01-26 DIAGNOSIS — N644 Mastodynia: Secondary | ICD-10-CM | POA: Diagnosis not present

## 2022-02-14 ENCOUNTER — Encounter (HOSPITAL_COMMUNITY): Payer: Self-pay

## 2022-02-19 ENCOUNTER — Other Ambulatory Visit: Payer: Self-pay

## 2022-02-19 DIAGNOSIS — I890 Lymphedema, not elsewhere classified: Secondary | ICD-10-CM | POA: Diagnosis not present

## 2022-02-19 DIAGNOSIS — Z170 Estrogen receptor positive status [ER+]: Secondary | ICD-10-CM | POA: Diagnosis not present

## 2022-02-19 DIAGNOSIS — C50411 Malignant neoplasm of upper-outer quadrant of right female breast: Secondary | ICD-10-CM | POA: Diagnosis not present

## 2022-02-23 ENCOUNTER — Encounter: Payer: Self-pay | Admitting: Physical Therapy

## 2022-02-23 ENCOUNTER — Ambulatory Visit: Payer: Medicare Other | Attending: Adult Health | Admitting: Physical Therapy

## 2022-02-23 DIAGNOSIS — I890 Lymphedema, not elsewhere classified: Secondary | ICD-10-CM | POA: Diagnosis not present

## 2022-02-23 DIAGNOSIS — C50411 Malignant neoplasm of upper-outer quadrant of right female breast: Secondary | ICD-10-CM | POA: Diagnosis not present

## 2022-02-23 DIAGNOSIS — R293 Abnormal posture: Secondary | ICD-10-CM

## 2022-02-23 DIAGNOSIS — Z170 Estrogen receptor positive status [ER+]: Secondary | ICD-10-CM | POA: Insufficient documentation

## 2022-02-23 DIAGNOSIS — Z483 Aftercare following surgery for neoplasm: Secondary | ICD-10-CM | POA: Insufficient documentation

## 2022-02-23 NOTE — Therapy (Signed)
?OUTPATIENT PHYSICAL THERAPY ONCOLOGY EVALUATION ? ?Patient Name: Nichole Ritter ?MRN: [id number] ?DOB:12/28/1950, 71 y.o., female ?Today's Date: 02/23/2022 ? ? PT End of Session - 02/23/22 1156   ? ? Visit Number 1   ? Number of Visits 9   ? Date for PT Re-Evaluation 03/23/22   ? PT Start Time 1107   ? PT Stop Time 1147   ? PT Time Calculation (min) 40 min   ? Activity Tolerance Patient tolerated treatment well   ? Behavior During Therapy [facility] for tasks assessed/performed   ? ?  ?  ? ?  ? ? ?Past Medical History:  ?Diagnosis Date  ? Allergic rhinitis   ? Aortic atherosclerosis ([place])   ? Arthritis   ? Barrett esophagus   ? Barrett's esophagus   ? Bunion   ? Cancer [facility])   ? right breast IMC  ? Family history of breast cancer   ? Family history of leukemia   ? Family history of lung cancer   ? Family history of ovarian cancer   ? Family history of prostate cancer   ? Female cystocele   ? GERD (gastroesophageal reflux disease)   ? History of epistaxis 2012  ? Hyperlipidemia   ? UNSPECIFIED  ? Lichen sclerosus   ? Obesity   ? Osteopenia   ? Personal history of colonic polyps   ? Rectocele   ? Situational stress   ? Stress incontinence   ? Vitamin D deficiency   ? ?Past Surgical History:  ?Procedure Laterality Date  ? BREAST LUMPECTOMY    ? BREAST LUMPECTOMY WITH RADIOACTIVE SEED AND SENTINEL LYMPH NODE BIOPSY Right 06/15/2021  ? Procedure: RIGHT BREAST LUMPECTOMY WITH RADIOACTIVE SEED X 2 AND SENTINEL LYMPH NODE BIOPSY;  Surgeon: Erroll Luna, MD;  Location: [place];  Service: General;  Laterality: Right;  ? CATARACT EXTRACTION Bilateral 02/2018  ? 03/2018  ? COLONOSCOPY  01/2013  ? LUMBAR DISC SURGERY    ? TUBAL LIGATION    ? ?Patient Active Problem List  ? Diagnosis Date Noted  ? Genetic testing 05/20/2021  ? Family history of prostate cancer 05/10/2021  ? Family history of breast cancer 05/10/2021  ? Family history of ovarian cancer 05/10/2021  ? Family history of leukemia 05/10/2021  ? Family history  of lung cancer 05/10/2021  ? Malignant neoplasm of upper-outer quadrant of right female breast ([place]) 05/05/2021  ? Aortic atherosclerosis ([place]) 01/06/2019  ? Mixed hyperlipidemia 01/06/2019  ? Family history of arteriosclerotic cardiovascular disease 01/06/2019  ? ? ?PCP: [name], MD ? ?REFERRING PROVIDER: Thedore Mins, NP ? ?REFERRING DIAG: C50.411,Z17.0 (ICD-10-CM) - Malignant neoplasm of upper-outer quadrant of right breast in female, estrogen receptor positive ([name]) ? ?THERAPY DIAG:  ?Lymphedema, not elsewhere classified ? ?Aftercare following surgery for neoplasm ? ?Abnormal posture ? ?Carcinoma of upper-outer quadrant of right female breast, unspecified estrogen receptor status ([place]) ? ?ONSET DATE: 09/12/21 ? ?SUBJECTIVE                                                                                                                                                                                          ? ?  SUBJECTIVE STATEMENT: I have had several bouts of swelling in the R breast that I have had to take antibiotics for. In November there was a pocket of swelling. I had a seroma.They have aspirated it and say it is nothing bad. The last time I did not have it aspirated and I just finished antibiotics two weeks ago.  ? ?PERTINENT HISTORY: Patient was diagnosed on 02/21/2021 with right invasive lobular carcinoma breast cancer. She underwent a right lumpectomy and sentinel node biopsy (1 negative node) on 06/15/2021. 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  ? ? ? ?PAIN:  ?Are you having pain? No ?NPRS scale: 5/10 ?Pain location: R breast ?Pain orientation: Right  ?PAIN TYPE: tenderness ?Pain description: constant  ?Aggravating factors: clothing, touching it ?Relieving factors: antibiotics ? ?PRECAUTIONS: Other: R breast lymphedema ? ?WEIGHT BEARING RESTRICTIONS No ? ?FALLS:  ?Has patient fallen in last 6 months? No ? ?LIVING ENVIRONMENT: ?Lives with: lives with their spouse ?Lives  in: House/apartment ?Stairs: Yes; 4 steps to enter home ?Has following equipment at home: Grab bars ? ?OCCUPATION: retired ? ?LEISURE: pt does not regularly exercise, tries to walk about 3x/wk ? ?HAND DOMINANCE : right  ? ?PRIOR LEVEL OF FUNCTION: Independent ? ?PATIENT GOALS to get the swelling down, to decrease number of infections ? ? ?OBJECTIVE ? ?COGNITION: ? Overall cognitive status: Within functional limits for tasks assessed  ? ?PALPATION: 2 large seromas palpable in superior and lateral breast and increased fibrosis in medial inferior breast ? ? ?POSTURE: forward head, rounded shoulders ? ?UPPER EXTREMITY AROM/PROM: WFL ? ? ?UPPER EXTREMITY STRENGTH: 5/5 ? ? ?LYMPHEDEMA ASSESSMENTS:  ? ?SURGERY TYPE/DATE: 06/15/21 (2 lumpectomies at same time) ? ?NUMBER OF LYMPH NODES REMOVED: 1 (was negative) ? ?CHEMOTHERAPY: did not require ? ?RADIATION: completed ? ?HORMONE TREATMENT: was taking letrozole but stopped due to side effects ? ?INFECTIONS: has taken antibiotics numerous times in the last year for infection in R breast ? ? ? ?L-DEX LYMPHEDEMA SCREENING: ? ?The patient was assessed using the L-Dex machine today to produce a lymphedema index baseline score. The patient will be reassessed on a regular basis (typically every 3 months) to obtain new L-Dex scores. If the score is > 6.5 points away from his/her baseline score indicating onset of subclinical lymphedema, it will be recommended to wear a compression garment for 4 weeks, 12 hours per day and then be reassessed. If the score continues to be > 6.5 points from baseline at reassessment, we will initiate lymphedema treatment. Assessing in this manner has a 95% rate of preventing clinically significant lymphedema. ? ? L-DEX FLOWSHEETS - 02/23/22 1100   ? ?  ? L-DEX LYMPHEDEMA SCREENING  ? Measurement Type Unilateral   ? L-DEX MEASUREMENT EXTREMITY Upper Extremity   ? POSITION  Standing   ? DOMINANT SIDE Right   ? At Risk Side Right   ? BASELINE SCORE  (UNILATERAL) 1.7   ? L-DEX SCORE (UNILATERAL) 0.4   ? VALUE CHANGE (UNILAT) -1.3   ? ?  ?  ? ?  ? ? ?TODAY'S TREATMENT  ?Issued script for pt to obtain a new well fitting compression bra and a solaris swell spot to wear in her bra to help soften fibrosis ? ?PATIENT EDUCATION:  ?Education details: need for compression bra and swell spot ?Person educated: Patient ?Education method: Explanation and Handouts ?Education comprehension: verbalized understanding ? ? ?HOME EXERCISE PROGRAM: ?Begin wearing swell spot and new compression bra once obtained ? ?ASSESSMENT: ? ?  CLINICAL IMPRESSION: ?Patient is a 71 y.o. female who was seen today for physical therapy evaluation and treatment for R breast lymphedema. She underwent 2 lumpectomies for two different types of cancer in August 2022. In Nov she developed her first infection and had to take a round of antibiotics. She has since taken antibiotics numerous times for infections in the R breast in area of seromas. She has not had her seroma aspirated since Nov. There is a large seroma palpable in R breast and increased fibrosis palpable in medial inferior breast. Pt would benefit from skilled PT services to decrease swelling, assist with obtaining appropriate compression garments and educate pt how to manage lymphedema independently.   ? ? ?OBJECTIVE IMPAIRMENTS increased edema, postural dysfunction, and pain.  ? ?ACTIVITY LIMITATIONS  None .  ? ?PERSONAL FACTORS No personal factors are also affecting patient's functional outcome.  ? ? ?REHAB POTENTIAL: Good ? ?CLINICAL DECISION MAKING: Stable/uncomplicated ? ?EVALUATION COMPLEXITY: Low ? ?GOALS: ?Goals reviewed with patient? Yes ? ?SHORT TERM GOALS: Target date: 03/09/2022 ? ?Pt will obtain a compression bra and swell spot for long term management of lymphedema. ?Baseline: ?Goal status: INITIAL ? ? ?LONG TERM GOALS: Target date: 03/23/2022 ? ?Pt will be independent in self MLD for long term management of lymphedema. ?Baseline:   ?Goal status: INITIAL ? ?2.  Pt will receive trial of FlexiTouch compression pump for long term management of lymphedema. ?Baseline:  ?Goal status: INITIAL ? ?3.  Pt will report a 50% improvement in swelling an

## 2022-02-26 ENCOUNTER — Inpatient Hospital Stay: Payer: Medicare Other | Attending: Radiation Oncology | Admitting: Adult Health

## 2022-02-26 ENCOUNTER — Other Ambulatory Visit: Payer: Self-pay

## 2022-02-26 VITALS — BP 143/79 | HR 80 | Temp 97.7°F | Resp 18 | Ht 63.0 in | Wt 192.0 lb

## 2022-02-26 DIAGNOSIS — Z832 Family history of diseases of the blood and blood-forming organs and certain disorders involving the immune mechanism: Secondary | ICD-10-CM | POA: Diagnosis not present

## 2022-02-26 DIAGNOSIS — Z170 Estrogen receptor positive status [ER+]: Secondary | ICD-10-CM | POA: Diagnosis not present

## 2022-02-26 DIAGNOSIS — Z833 Family history of diabetes mellitus: Secondary | ICD-10-CM | POA: Diagnosis not present

## 2022-02-26 DIAGNOSIS — Z8041 Family history of malignant neoplasm of ovary: Secondary | ICD-10-CM | POA: Insufficient documentation

## 2022-02-26 DIAGNOSIS — Z8249 Family history of ischemic heart disease and other diseases of the circulatory system: Secondary | ICD-10-CM | POA: Insufficient documentation

## 2022-02-26 DIAGNOSIS — Z806 Family history of leukemia: Secondary | ICD-10-CM | POA: Insufficient documentation

## 2022-02-26 DIAGNOSIS — Z801 Family history of malignant neoplasm of trachea, bronchus and lung: Secondary | ICD-10-CM | POA: Diagnosis not present

## 2022-02-26 DIAGNOSIS — Z823 Family history of stroke: Secondary | ICD-10-CM | POA: Insufficient documentation

## 2022-02-26 DIAGNOSIS — Z79899 Other long term (current) drug therapy: Secondary | ICD-10-CM | POA: Diagnosis not present

## 2022-02-26 DIAGNOSIS — I700 Atherosclerosis of aorta: Secondary | ICD-10-CM | POA: Insufficient documentation

## 2022-02-26 DIAGNOSIS — Z803 Family history of malignant neoplasm of breast: Secondary | ICD-10-CM | POA: Insufficient documentation

## 2022-02-26 DIAGNOSIS — C50411 Malignant neoplasm of upper-outer quadrant of right female breast: Secondary | ICD-10-CM | POA: Diagnosis not present

## 2022-02-26 DIAGNOSIS — Z8042 Family history of malignant neoplasm of prostate: Secondary | ICD-10-CM | POA: Insufficient documentation

## 2022-02-26 DIAGNOSIS — Z8719 Personal history of other diseases of the digestive system: Secondary | ICD-10-CM | POA: Insufficient documentation

## 2022-02-26 NOTE — Assessment & Plan Note (Addendum)
06/15/2021: Right superior lumpectomy: High-grade DCIS with necrosis, LCIS, margins negative ?Right lateral lumpectomy: Grade 2 invasive lobular carcinoma, 1.3 cm, margins negative, focal ADH, ALH, 0/1 lymph node negative, ER 80%, PR 60%, Ki-67 1%, HER2 negative 1+ ?Oncotype DX score: 19: Distant recurrence rate at 9 years: 6% ?Adjuvant radiation: Completed 08/21/2021 ? ?Treatment plan: Unable to tolerate letrozole, has opted to forego antiestrogen therapy. ? ?Nichole Ritter is here today for follow-up of her right-sided breast cancer.  She has no clinical or radiographic sign of breast cancer recurrence.  I have ordered repeat mammogram of her bilateral breast as it is due.  I also ordered right breast ultrasound.  Her breast does not appear acutely infected today.  She is going to begin physical therapy tomorrow.  She will let me know if she feels that she is developing symptoms of infection and we can send in antibiotics and send her back to Dr. Brantley Stage for management. ? ?Nichole Ritter has decided not to take letrozole or any other antiestrogen therapy.  We discussed this in detail today as well as the risks involved with this and she verbalizes understanding of her increased risk of recurrence.  She we will follow-up with Dr. Lindi Adie in 6 months. ?

## 2022-02-26 NOTE — Progress Notes (Signed)
[facility] Follow up: ?  ? ?[name], MD ?[address] ?[place] [address] ? ? ?DIAGNOSIS:  Cancer Staging  ?Malignant neoplasm of upper-outer quadrant of right female breast ([place]) ?Staging form: Breast, AJCC 8th Edition ?- Clinical stage from 05/10/2021: cT1b, cN0, cM0, G1, ER+, PR+, HER2: Unknown - Signed by Nicholas Lose, MD on 05/10/2021 ?Stage prefix: Initial diagnosis ?Histologic grading system: 3 grade system ?Laterality: Right ?Staged by: Pathologist and managing physician ?Stage used in treatment planning: Yes ?National guidelines used in treatment planning: Yes ?Type of national guideline used in treatment planning: NCCN ?- Pathologic stage from 07/18/2021: No Stage Recommended (pT1c, cN0, cM0, G2, ER+, PR+, HER2-, Oncotype DX score: 19) - Unsigned ?Method of lymph node assessment: Clinical ?Multigene prognostic tests performed: Oncotype DX ?Recurrence score range: Greater than or equal to 11 ?Histologic grading system: 3 grade system ? ? ?SUMMARY OF ONCOLOGIC HISTORY: ?Oncology History  ?Malignant neoplasm of upper-outer quadrant of right female breast ([place])  ?04/24/2021 Initial Diagnosis  ? Screening mammogram detected architectural distortion 9:00 measuring 0.9 cm biopsy revealed invasive mammary cancer with lobular features prognostic panel pending, right breast calcifications at 12:00 1.2 cm, biopsy showed DCIS with LCIS high-grade with necrosis and calcifications ER greater than 95%, PR 40% ?  ?05/10/2021 Cancer Staging  ? Staging form: Breast, AJCC 8th Edition ?- Clinical stage from 05/10/2021: cT1b, cN0, cM0, G1, ER+, PR+, HER2: Unknown - Signed by Nicholas Lose, MD on 05/10/2021 ?Stage prefix: Initial diagnosis ?Histologic grading system: 3 grade system ?Laterality: Right ?Staged by: Pathologist and managing physician ?Stage used in treatment planning: Yes ?National guidelines used in treatment planning: Yes ?Type of national guideline used in treatment  planning: NCCN ? ?  ?05/20/2021 Genetic Testing  ? Negative genetic testing:  No pathogenic variants detected on the [facility] BRCAplus panel (report date 05/20/2021) or the CancerNext-Expanded + RNAinsight panel (report date 05/22/2021). 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 ? ?The BRCAplus panel offered by [employer] and includes sequencing and deletion/duplication analysis for the following 8 genes: ATM, BRCA1, BRCA2, CDH1, CHEK2, PALB2, PTEN, and TP53. The CancerNext-Expanded + RNAinsight gene panel offered by [employer] and includes sequencing and rearrangement analysis for the following 77 genes: AIP, ALK, APC, ATM, AXIN2, BAP1, BARD1, BLM, BMPR1A, BRCA1, BRCA2, BRIP1, CDC73, CDH1, CDK4, CDKN1B, CDKN2A, CHEK2, CTNNA1, DICER1, FANCC, FH, FLCN, GALNT12, KIF1B, LZTR1, MAX, MEN1, MET, MLH1, MSH2, MSH3, MSH6, MUTYH, NBN, NF1, NF2, NTHL1, PALB2, PHOX2B, PMS2, POT1, PRKAR1A, PTCH1, PTEN, RAD51C, RAD51D, RB1, RECQL, RET, SDHA, SDHAF2, SDHB, SDHC, SDHD, SMAD4, SMARCA4, SMARCB1, SMARCE1, STK11, SUFU, TMEM127, TP53, TSC1, TSC2, VHL and XRCC2 (sequencing and deletion/duplication); EGFR, EGLN1, HOXB13, KIT, MITF, PDGFRA, POLD1 and POLE (sequencing only); EPCAM and GREM1 (deletion/duplication only). RNA data is routinely analyzed for use in variant interpretation for all genes. ?  ?06/15/2021 Definitive Surgery  ? FINAL MICROSCOPIC DIAGNOSIS:  ? ?A. BREAST, RIGHT SUPERIOR, LUMPECTOMY:  ?- Ductal carcinoma in situ, high-grade with necrosis and calcifications  ?- Lobular carcinoma in situ  ?- No evidence of invasive carcinoma  ?- Resection margins are negative for DCIS  ?- Biopsy site changes  ?- See oncology table  ? ?B. BREAST, RIGHT LATERAL, LUMPECTOMY:  ?- Invasive lobular carcinoma, 1.3 cm, grade 2, see comment  ?- Resection margins are negative for carcinoma  ?- Focal atypical ductal hyperplasia  ?- Atypical lobular hyperplasia  ?- Fibrocystic change with  calcifications  ?- Biopsy site  changes  ? ?C. BREAST, RIGHT LATERAL ADDITIONAL ANTEROMEDIAL MARGIN, EXCISION:  ?- Benign breast parenchyma, negative for carcinoma  ? ?D. BREAST, RIGHT LATERAL ADDITIONAL INFERIOR/LATERAL MARGIN, EXCISION:  ?- Benign breast parenchyma, negative for carcinoma  ? ?E. BREAST, RIGHT LATERAL ADDITIONAL POSTERIOR/SUPERIOR MARGIN, EXCISION:  ?- Benign breast parenchyma, negative for carcinoma  ? ?F. LYMPH NODE, RIGHT AXILLARY, SENTINEL, EXCISION:  ?- Lymph node, negative for carcinoma (0/1), see comment  ? ?COMMENT:  ?B and F.   Immunohistochemical stain for E-cadherin is negative in the  ?tumor cells, consistent with a lobular phenotype.  Immunostain for  ?cytokeratin AE1/AE3 performed on part F is negative for metastatic  ?carcinoma.  ? ?ADDENDUM:  ?B5.  PROGNOSTIC INDICATOR RESULTS:  ?The tumor cells are NEGATIVE for Her2 (1+).  ?Estrogen Receptor: POSITIVE, 80% MODERATE STAINING INTENSITY  ?Progesterone Receptor: POSITIVE, 60% MODERATE STAINING INTENSITY  ?Proliferation Marker Ki-67: 1%  ?  ?06/30/2021 Oncotype testing  ? Oncotype DX: Recurrence score: 19, distant recurrence at 9 years: 6% ?  ?07/26/2021 - 08/21/2021 Radiation Therapy  ? Adjuvant radiation ?  ?09/12/2021 -  Anti-estrogen oral therapy  ? Adjuvant letrozole ?  ? ? ?CURRENT THERAPY: Observation ? ?INTERVAL HISTORY: ?Nichole Ritter 71 y.o. female returns for follow-up of her history of right-sided breast cancer.  She has had recurrent right breast seromas that have required several antibiotics and draining to help alleviate this issue.  She is scheduled to start physical therapy for this tomorrow.  She notes her right breast is still somewhat swollen and does have a pink tinge.  It is also slightly tender.  She denies any fever or chills.  She notes that when she feels like the infection is returning she begins to feel unwell and she has not felt like this recently. ? ?She also tells me that after considering the risk of  recurrence in addition to side effects from the antiestrogen therapies she does not want to try any additional medication at this time. ? ? ?Patient Active Problem List  ? Diagnosis Date Noted  ? Genetic testing 05/20/2021  ? Family history of prostate cancer 05/10/2021  ? Family history of breast cancer 05/10/2021  ? Family history of ovarian cancer 05/10/2021  ? Family history of leukemia 05/10/2021  ? Family history of lung cancer 05/10/2021  ? Malignant neoplasm of upper-outer quadrant of right female breast ([place]) 05/05/2021  ? Aortic atherosclerosis ([place]) 01/06/2019  ? Mixed hyperlipidemia 01/06/2019  ? Family history of arteriosclerotic cardiovascular disease 01/06/2019  ? ? ?is allergic to caine-1 [lidocaine], esomeprazole magnesium, formaldehyde, and nickel. ? ?MEDICAL HISTORY: ?Past Medical History:  ?Diagnosis Date  ? Allergic rhinitis   ? Aortic atherosclerosis ([place])   ? Arthritis   ? Barrett esophagus   ? Barrett's esophagus   ? Bunion   ? Cancer [facility])   ? right breast IMC  ? Family history of breast cancer   ? Family history of leukemia   ? Family history of lung cancer   ? Family history of ovarian cancer   ? Family history of prostate cancer   ? Female cystocele   ? GERD (gastroesophageal reflux disease)   ? History of epistaxis 2012  ? Hyperlipidemia   ? UNSPECIFIED  ? Lichen sclerosus   ? Obesity   ? Osteopenia   ? Personal history of colonic polyps   ? Rectocele   ? Situational stress   ? Stress incontinence   ? Vitamin D deficiency   ? ? ?SURGICAL HISTORY: ?Past  Surgical History:  ?Procedure Laterality Date  ? BREAST LUMPECTOMY    ? BREAST LUMPECTOMY WITH RADIOACTIVE SEED AND SENTINEL LYMPH NODE BIOPSY Right 06/15/2021  ? Procedure: RIGHT BREAST LUMPECTOMY WITH RADIOACTIVE SEED X 2 AND SENTINEL LYMPH NODE BIOPSY;  Surgeon: Erroll Luna, MD;  Location: [facility];  Service: General;  Laterality: Right;  ? CATARACT EXTRACTION Bilateral 02/2018  ? 03/2018  ? COLONOSCOPY  01/2013  ?  LUMBAR DISC SURGERY    ? TUBAL LIGATION    ? ? ?SOCIAL HISTORY: ?Social History  ? ?Socioeconomic History  ? Marital status: Married  ?  Spouse name: Not on file  ? Number of children: Not on file  ? Years of education: Not on

## 2022-02-28 ENCOUNTER — Ambulatory Visit: Payer: Medicare Other | Attending: Adult Health

## 2022-02-28 DIAGNOSIS — R293 Abnormal posture: Secondary | ICD-10-CM | POA: Insufficient documentation

## 2022-02-28 DIAGNOSIS — C50411 Malignant neoplasm of upper-outer quadrant of right female breast: Secondary | ICD-10-CM | POA: Diagnosis not present

## 2022-02-28 DIAGNOSIS — Z483 Aftercare following surgery for neoplasm: Secondary | ICD-10-CM | POA: Insufficient documentation

## 2022-02-28 DIAGNOSIS — R600 Localized edema: Secondary | ICD-10-CM | POA: Diagnosis not present

## 2022-02-28 DIAGNOSIS — I890 Lymphedema, not elsewhere classified: Secondary | ICD-10-CM | POA: Insufficient documentation

## 2022-02-28 NOTE — Therapy (Signed)
?OUTPATIENT PHYSICAL THERAPY TREATMENT NOTE ? ? ?Patient Name: Nichole Ritter ?MRN: [id number] ?DOB:Nov 10, 1950, 71 y.o., female ?Today's Date: 02/28/2022 ? ?PCP: [name], MD ?REFERRING PROVIDER: Thedore Mins, NP ? ?END OF SESSION:  ? PT End of Session - 02/28/22 0109   ? ? Visit Number 2   ? Number of Visits 9   ? Date for PT Re-Evaluation 03/23/22   ? PT Start Time 3235   ? PT Stop Time 1045   ? PT Time Calculation (min) 60 min   ? Activity Tolerance Patient tolerated treatment well   ? Behavior During Therapy [facility] for tasks assessed/performed   ? ?  ?  ? ?  ? ? ?Past Medical History:  ?Diagnosis Date  ? Allergic rhinitis   ? Aortic atherosclerosis ([place])   ? Arthritis   ? Barrett esophagus   ? Barrett's esophagus   ? Bunion   ? Cancer [facility])   ? right breast IMC  ? Family history of breast cancer   ? Family history of leukemia   ? Family history of lung cancer   ? Family history of ovarian cancer   ? Family history of prostate cancer   ? Female cystocele   ? GERD (gastroesophageal reflux disease)   ? History of epistaxis 2012  ? Hyperlipidemia   ? UNSPECIFIED  ? Lichen sclerosus   ? Obesity   ? Osteopenia   ? Personal history of colonic polyps   ? Rectocele   ? Situational stress   ? Stress incontinence   ? Vitamin D deficiency   ? ?Past Surgical History:  ?Procedure Laterality Date  ? BREAST LUMPECTOMY    ? BREAST LUMPECTOMY WITH RADIOACTIVE SEED AND SENTINEL LYMPH NODE BIOPSY Right 06/15/2021  ? Procedure: RIGHT BREAST LUMPECTOMY WITH RADIOACTIVE SEED X 2 AND SENTINEL LYMPH NODE BIOPSY;  Surgeon: Erroll Luna, MD;  Location: [facility];  Service: General;  Laterality: Right;  ? CATARACT EXTRACTION Bilateral 02/2018  ? 03/2018  ? COLONOSCOPY  01/2013  ? LUMBAR DISC SURGERY    ? TUBAL LIGATION    ? ?Patient Active Problem List  ? Diagnosis Date Noted  ? Genetic testing 05/20/2021  ? Family history of prostate cancer 05/10/2021  ? Family history of breast cancer 05/10/2021  ? Family history of  ovarian cancer 05/10/2021  ? Family history of leukemia 05/10/2021  ? Family history of lung cancer 05/10/2021  ? Malignant neoplasm of upper-outer quadrant of right female breast ([place]) 05/05/2021  ? Aortic atherosclerosis ([place]) 01/06/2019  ? Mixed hyperlipidemia 01/06/2019  ? Family history of arteriosclerotic cardiovascular disease 01/06/2019  ? ? ?REFERRING DIAG: C50.411,Z17.0 (ICD-10-CM) - Malignant neoplasm of upper-outer quadrant of right breast in female, estrogen receptor positive ([place]) ? ?THERAPY DIAG:  ?Lymphedema, not elsewhere classified ? ?Aftercare following surgery for neoplasm ? ?Abnormal posture ? ?Carcinoma of upper-outer quadrant of right female breast, unspecified estrogen receptor status ([place]) ? ?PERTINENT HISTORY: Patient was diagnosed on 02/21/2021 with right invasive lobular carcinoma breast cancer. She underwent a right lumpectomy and sentinel node biopsy (1 negative node) on 06/15/2021. 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  ? ?PRECAUTIONS: Other: R breast lymphedema ? ?SUBJECTIVE: I've called [employer] [facility] twice but haven't heard back from them yet. I did go ahead and order the swell spot that Blaire told me about last time and I'm just waiting for that to arrive.  ? ?PAIN:  ?Are you having pain? No ? ? ?OBJECTIVE: (objective measures  completed at initial evaluation unless otherwise dated) ? ? ?COGNITION: ?           Overall cognitive status: Within functional limits for tasks assessed  ?  ?PALPATION: 2 large seromas palpable in superior and lateral breast and increased fibrosis in medial inferior breast ?  ?  ?POSTURE: forward head, rounded shoulders ?  ?UPPER EXTREMITY AROM/PROM: WFL ?  ?  ?UPPER EXTREMITY STRENGTH: 5/5 ?  ?  ?LYMPHEDEMA ASSESSMENTS:  ?  ?SURGERY TYPE/DATE: 06/15/21 (2 lumpectomies at same time) ?  ?NUMBER OF LYMPH NODES REMOVED: 1 (was negative) ?  ?CHEMOTHERAPY: did not require ?  ?RADIATION: completed ?  ?HORMONE TREATMENT: was taking  letrozole but stopped due to side effects ?  ?INFECTIONS: has taken antibiotics numerous times in the last year for infection in R breast ?  ?  ?  ?L-DEX LYMPHEDEMA SCREENING: ?  ?The patient was assessed using the L-Dex machine today to produce a lymphedema index baseline score. The patient will be reassessed on a regular basis (typically every 3 months) to obtain new L-Dex scores. If the score is > 6.5 points away from his/her baseline score indicating onset of subclinical lymphedema, it will be recommended to wear a compression garment for 4 weeks, 12 hours per day and then be reassessed. If the score continues to be > 6.5 points from baseline at reassessment, we will initiate lymphedema treatment. Assessing in this manner has a 95% rate of preventing clinically significant lymphedema. ?  ?  L-DEX FLOWSHEETS - 02/23/22 1100   ?  ?    ?     ?  L-DEX LYMPHEDEMA SCREENING  ?  Measurement Type Unilateral   ?  L-DEX MEASUREMENT EXTREMITY Upper Extremity   ?  POSITION  Standing   ?  DOMINANT SIDE Right   ?  At Risk Side Right   ?  BASELINE SCORE (UNILATERAL) 1.7   ?  L-DEX SCORE (UNILATERAL) 0.4   ?  VALUE CHANGE (UNILAT) -1.3   ?  ?   ?  ?  ?   ?  ?  ?TODAY'S TREATMENT  ? 02/28/22: ?Self Care: Educated pt about Flexitouch compression pump, answering her questions about this. She is interested in pursuing this at this time so permission given to fax her demographic information to [facility].  ?Manual Therapy:  ? Manual lymph drainage: In Supine: Short neck, 5 diaphragmatic breaths, Lt axillary and Rt inguinal nodes, anterior inter-axillary and Rt axillo-inguinal anastomosis, then focused on Rt breast redirecting fluid towards anastomosis; Lt S/L for further work at lateral breast and around seroma then finished retracing all steps in supine reviewing pressure and correct skin stretch with pt throughout. Also firmer pressure used over varying areas of seroma, educating pt about this option as well, but always finishing  with MLD, not firm pressure. Pt able to verbalize good understanding.  ? ? 02/23/22: ?Issued script for pt to obtain a new well fitting compression bra and a solaris swell spot to wear in her bra to help soften fibrosis ?  ?PATIENT EDUCATION:  ?Education details: need for compression bra and swell spot ?Person educated: Patient ?Education method: Explanation and Handouts ?Education comprehension: verbalized understanding ?  ?  ?HOME EXERCISE PROGRAM: ?Begin wearing swell spot and new compression bra once obtained ?  ?ASSESSMENT: ?  ?CLINICAL IMPRESSION: ?Began manual lymph drainage which was review for pt as she has been instructed in this months ago. Firmer pressure used at times over area of seroma and some  softening around this was noted by end of session by therapist and pt. Also informed her about Flexitouch and answered her questions about this. Pt is interested in pursuing this at this time so will send her demographic information to [employer] per her request.  ?  ?  ?OBJECTIVE IMPAIRMENTS increased edema, postural dysfunction, and pain.  ?  ?ACTIVITY LIMITATIONS  None .  ?  ?PERSONAL FACTORS No personal factors are also affecting patient's functional outcome.  ?  ?  ?REHAB POTENTIAL: Good ?  ?CLINICAL DECISION MAKING: Stable/uncomplicated ?  ?EVALUATION COMPLEXITY: Low ?  ?GOALS: ?Goals reviewed with patient? Yes ?  ?SHORT TERM GOALS: Target date: 03/09/2022 ?  ?Pt will obtain a compression bra and swell spot for long term management of lymphedema. ?Baseline: ?Goal status: INITIAL ?  ?  ?LONG TERM GOALS: Target date: 03/23/2022 ?  ?Pt will be independent in self MLD for long term management of lymphedema. ?Baseline:  ?Goal status: INITIAL ?  ?2.  Pt will receive trial of FlexiTouch compression pump for long term management of lymphedema. ?Baseline:  ?Goal status: INITIAL ?  ?3.  Pt will report a 50% improvement in swelling and fibrosis in R breast to improve comfort.  ?Baseline:  ?Goal status: INITIAL ?  ?   ?PLAN: ?PT FREQUENCY: 2x/week ?  ?PT DURATION: 4 weeks ?  ?PLANNED INTERVENTIONS: Therapeutic exercises, Patient/Family education, Manual lymph drainage, Compression bandaging, scar mobilization, Taping, Vasopneu

## 2022-03-02 ENCOUNTER — Ambulatory Visit: Payer: Medicare Other

## 2022-03-02 DIAGNOSIS — I890 Lymphedema, not elsewhere classified: Secondary | ICD-10-CM

## 2022-03-02 DIAGNOSIS — Z483 Aftercare following surgery for neoplasm: Secondary | ICD-10-CM | POA: Diagnosis not present

## 2022-03-02 DIAGNOSIS — C50411 Malignant neoplasm of upper-outer quadrant of right female breast: Secondary | ICD-10-CM

## 2022-03-02 DIAGNOSIS — R600 Localized edema: Secondary | ICD-10-CM

## 2022-03-02 DIAGNOSIS — R293 Abnormal posture: Secondary | ICD-10-CM | POA: Diagnosis not present

## 2022-03-02 NOTE — Therapy (Signed)
?OUTPATIENT PHYSICAL THERAPY TREATMENT NOTE ? ? ?Patient Name: Nichole Ritter ?MRN: [id number] ?DOB:[birth year], 71 y.o., female ?Today's Date: 03/02/2022 ? ?PCP: [name], MD ?REFERRING PROVIDER: Thedore Mins, NP ? ?END OF SESSION:  ? PT End of Session - 03/02/22 1206   ? ? Visit Number 3   ? Number of Visits 9   ? Date for PT Re-Evaluation 03/23/22   ? PT Start Time 1105   ? PT Stop Time 1200   ? PT Time Calculation (min) 55 min   ? Activity Tolerance Patient tolerated treatment well   ? Behavior During Therapy [facility] for tasks assessed/performed   ? ?  ?  ? ?  ? ? ? ?Past Medical History:  ?Diagnosis Date  ? Allergic rhinitis   ? Aortic atherosclerosis ([place])   ? Arthritis   ? Barrett esophagus   ? Barrett's esophagus   ? Bunion   ? Cancer [facility])   ? right breast IMC  ? Family history of breast cancer   ? Family history of leukemia   ? Family history of lung cancer   ? Family history of ovarian cancer   ? Family history of prostate cancer   ? Female cystocele   ? GERD (gastroesophageal reflux disease)   ? History of epistaxis 2012  ? Hyperlipidemia   ? UNSPECIFIED  ? Lichen sclerosus   ? Obesity   ? Osteopenia   ? Personal history of colonic polyps   ? Rectocele   ? Situational stress   ? Stress incontinence   ? Vitamin D deficiency   ? ?Past Surgical History:  ?Procedure Laterality Date  ? BREAST LUMPECTOMY    ? BREAST LUMPECTOMY WITH RADIOACTIVE SEED AND SENTINEL LYMPH NODE BIOPSY Right 06/15/2021  ? Procedure: RIGHT BREAST LUMPECTOMY WITH RADIOACTIVE SEED X 2 AND SENTINEL LYMPH NODE BIOPSY;  Surgeon: Erroll Luna, MD;  Location: [place];  Service: General;  Laterality: Right;  ? CATARACT EXTRACTION Bilateral 02/2018  ? 03/2018  ? COLONOSCOPY  01/2013  ? LUMBAR DISC SURGERY    ? TUBAL LIGATION    ? ?Patient Active Problem List  ? Diagnosis Date Noted  ? Genetic testing 05/20/2021  ? Family history of prostate cancer 05/10/2021  ? Family history of breast cancer 05/10/2021  ? Family history of  ovarian cancer 05/10/2021  ? Family history of leukemia 05/10/2021  ? Family history of lung cancer 05/10/2021  ? Malignant neoplasm of upper-outer quadrant of right female breast ([place]) 05/05/2021  ? Aortic atherosclerosis ([place]) 01/06/2019  ? Mixed hyperlipidemia 01/06/2019  ? Family history of arteriosclerotic cardiovascular disease 01/06/2019  ? ? ?REFERRING DIAG: C50.411,Z17.0 (ICD-10-CM) - Malignant neoplasm of upper-outer quadrant of right breast in female, estrogen receptor positive ([place]) ? ?THERAPY DIAG:  ?Lymphedema, not elsewhere classified ? ?Aftercare following surgery for neoplasm ? ?Abnormal posture ? ?Carcinoma of upper-outer quadrant of right female breast, unspecified estrogen receptor status ([place]) ? ?Localized edema ? ?PERTINENT HISTORY: Patient was diagnosed on 02/21/2021 with right invasive lobular carcinoma breast cancer. She underwent a right lumpectomy and sentinel node biopsy (1 negative node) on 06/15/2021. 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  ? ?PRECAUTIONS: Other: R breast lymphedema ? ?SUBJECTIVE: I have an appt with [facility] on Tuesday. The swell spot came in yesterday and I put it in this morning about 2-3 hours ago.  I have been doing the MLD . I have had 2 infections most recently in March.  It feels  better since Mateo Flow worked on it last time, and I tried it too. I feel like my skin is a little Crepy now so I think some swelling is out ? ?PAIN:  ?Are you having pain? No ? ? ?OBJECTIVE: (objective measures completed at initial evaluation unless otherwise dated) ? ? ?COGNITION: ?           Overall cognitive status: Within functional limits for tasks assessed  ?  ?PALPATION: 2 large seromas palpable in superior and lateral breast and increased fibrosis in medial inferior breast ?  ?  ?POSTURE: forward head, rounded shoulders ?  ?UPPER EXTREMITY AROM/PROM: WFL ?  ?  ?UPPER EXTREMITY STRENGTH: 5/5 ?  ?  ?LYMPHEDEMA ASSESSMENTS:  ?  ?SURGERY TYPE/DATE:  06/15/21 (2 lumpectomies at same time) ?  ?NUMBER OF LYMPH NODES REMOVED: 1 (was negative) ?  ?CHEMOTHERAPY: did not require ?  ?RADIATION: completed ?  ?HORMONE TREATMENT: was taking letrozole but stopped due to side effects ?  ?INFECTIONS: has taken antibiotics numerous times in the last year for infection in R breast ?  ?  ?  ?L-DEX LYMPHEDEMA SCREENING: ?  ?The patient was assessed using the L-Dex machine today to produce a lymphedema index baseline score. The patient will be reassessed on a regular basis (typically every 3 months) to obtain new L-Dex scores. If the score is > 6.5 points away from his/her baseline score indicating onset of subclinical lymphedema, it will be recommended to wear a compression garment for 4 weeks, 12 hours per day and then be reassessed. If the score continues to be > 6.5 points from baseline at reassessment, we will initiate lymphedema treatment. Assessing in this manner has a 95% rate of preventing clinically significant lymphedema. ?  ?  L-DEX FLOWSHEETS - 02/23/22 1100   ?  ?    ?     ?  L-DEX LYMPHEDEMA SCREENING  ?  Measurement Type Unilateral   ?  L-DEX MEASUREMENT EXTREMITY Upper Extremity   ?  POSITION  Standing   ?  DOMINANT SIDE Right   ?  At Risk Side Right   ?  BASELINE SCORE (UNILATERAL) 1.7   ?  L-DEX SCORE (UNILATERAL) 0.4   ?  VALUE CHANGE (UNILAT) -1.3   ?  ?   ?  ?  ?   ?  ?  ?TODAY'S TREATMENT  ?03/02/2022: ?Manual Therapy:  ?Pt removed bra and swell spot; appropriated indentations from swell spot noted ? Manual lymph drainage: In Supine with head of table slightly elevated: Short neck, 5 diaphragmatic breaths, Lt axillary and Rt inguinal nodes, anterior inter-axillary and Rt axillo-inguinal anastomosis, firmer pressure over seroma to soften then focused on Rt breast redirecting fluid towards anastomosis; Lt S/L for further work at lateral breast and around seroma then finished retracing all steps in supine reviewing pressure and correct skin stretch with pt  throughout. Also educated pt about firmer pressure used over varying areas of seroma,  Reviewed diaphragmatic breathing with pt and encouraged her to practice this at home. ? ? 02/28/22: ?Self Care: Educated pt about Flexitouch compression pump, answering her questions about this. She is interested in pursuing this at this time so permission given to fax her demographic information to [place].  ?Manual Therapy:  ? Manual lymph drainage: In Supine: Short neck, 5 diaphragmatic breaths, Lt axillary and Rt inguinal nodes, anterior inter-axillary and Rt axillo-inguinal anastomosis, then focused on Rt breast redirecting fluid towards anastomosis; Lt S/L for further work at lateral breast and  around seroma then finished retracing all steps in supine reviewing pressure and correct skin stretch with pt throughout. Also firmer pressure used over varying areas of seroma, educating pt about this option as well, but always finishing with MLD, not firm pressure. Pt able to verbalize good understanding.  ? ? 02/23/22: ?Issued script for pt to obtain a new well fitting compression bra and a solaris swell spot to wear in her bra to help soften fibrosis ?  ?PATIENT EDUCATION:  ?Education details: need for compression bra and swell spot ?Person educated: Patient ?Education method: Explanation and Handouts ?Education comprehension: verbalized understanding ?  ?  ?HOME EXERCISE PROGRAM: ?Begin wearing swell spot and new compression bra once obtained ?  ?ASSESSMENT: ?  ?CLINICAL IMPRESSION: ?Continued manual lymph drainage with head of table slightly elevated. Firmer pressure used at times over area of seroma prior to beginning and during to soften. Pt with appropriate impressions made in skin from swell spot. She has appt for new bra next Tuesday.  She has not had contact from [place] yet.  ?  ?  ?OBJECTIVE IMPAIRMENTS increased edema, postural dysfunction, and pain.  ?  ?ACTIVITY LIMITATIONS  None .  ?  ?PERSONAL FACTORS No personal  factors are also affecting patient's functional outcome.  ?  ?  ?REHAB POTENTIAL: Good ?  ?CLINICAL DECISION MAKING: Stable/uncomplicated ?  ?EVALUATION COMPLEXITY: Low ?  ?GOALS: ?Goals reviewed with patient

## 2022-03-05 ENCOUNTER — Ambulatory Visit: Payer: Medicare Other

## 2022-03-05 DIAGNOSIS — R293 Abnormal posture: Secondary | ICD-10-CM | POA: Diagnosis not present

## 2022-03-05 DIAGNOSIS — Z483 Aftercare following surgery for neoplasm: Secondary | ICD-10-CM | POA: Diagnosis not present

## 2022-03-05 DIAGNOSIS — C50411 Malignant neoplasm of upper-outer quadrant of right female breast: Secondary | ICD-10-CM | POA: Diagnosis not present

## 2022-03-05 DIAGNOSIS — I890 Lymphedema, not elsewhere classified: Secondary | ICD-10-CM

## 2022-03-05 DIAGNOSIS — R600 Localized edema: Secondary | ICD-10-CM | POA: Diagnosis not present

## 2022-03-05 NOTE — Therapy (Signed)
?OUTPATIENT PHYSICAL THERAPY TREATMENT NOTE ? ? ?Patient Name: Nichole Ritter ?MRN: [id number] ?DOB:[birth year], 71 y.o., female ?Today's Date: 03/05/2022 ? ?PCP: [name], MD ?REFERRING PROVIDER: Thedore Mins, NP ? ?END OF SESSION:  ? PT End of Session - 03/05/22 1115   ? ? Visit Number 4   ? Number of Visits 9   ? Date for PT Re-Evaluation 03/23/22   ? PT Start Time 1105   ? PT Stop Time 9628   ? PT Time Calculation (min) 59 min   ? Activity Tolerance Patient tolerated treatment well   ? Behavior During Therapy [facility] for tasks assessed/performed   ? ?  ?  ? ?  ? ? ? ?Past Medical History:  ?Diagnosis Date  ? Allergic rhinitis   ? Aortic atherosclerosis ([place])   ? Arthritis   ? Barrett esophagus   ? Barrett's esophagus   ? Bunion   ? Cancer [facility])   ? right breast IMC  ? Family history of breast cancer   ? Family history of leukemia   ? Family history of lung cancer   ? Family history of ovarian cancer   ? Family history of prostate cancer   ? Female cystocele   ? GERD (gastroesophageal reflux disease)   ? History of epistaxis 2012  ? Hyperlipidemia   ? UNSPECIFIED  ? Lichen sclerosus   ? Obesity   ? Osteopenia   ? Personal history of colonic polyps   ? Rectocele   ? Situational stress   ? Stress incontinence   ? Vitamin D deficiency   ? ?Past Surgical History:  ?Procedure Laterality Date  ? BREAST LUMPECTOMY    ? BREAST LUMPECTOMY WITH RADIOACTIVE SEED AND SENTINEL LYMPH NODE BIOPSY Right 06/15/2021  ? Procedure: RIGHT BREAST LUMPECTOMY WITH RADIOACTIVE SEED X 2 AND SENTINEL LYMPH NODE BIOPSY;  Surgeon: Erroll Luna, MD;  Location: [place];  Service: General;  Laterality: Right;  ? CATARACT EXTRACTION Bilateral 02/2018  ? 03/2018  ? COLONOSCOPY  01/2013  ? LUMBAR DISC SURGERY    ? TUBAL LIGATION    ? ?Patient Active Problem List  ? Diagnosis Date Noted  ? Genetic testing 05/20/2021  ? Family history of prostate cancer 05/10/2021  ? Family history of breast cancer 05/10/2021  ? Family history of  ovarian cancer 05/10/2021  ? Family history of leukemia 05/10/2021  ? Family history of lung cancer 05/10/2021  ? Malignant neoplasm of upper-outer quadrant of right female breast ([place]) 05/05/2021  ? Aortic atherosclerosis ([place]) 01/06/2019  ? Mixed hyperlipidemia 01/06/2019  ? Family history of arteriosclerotic cardiovascular disease 01/06/2019  ? ? ?REFERRING DIAG: C50.411,Z17.0 (ICD-10-CM) - Malignant neoplasm of upper-outer quadrant of right breast in female, estrogen receptor positive ([place]) ? ?THERAPY DIAG:  ?Lymphedema, not elsewhere classified ? ?Aftercare following surgery for neoplasm ? ?Abnormal posture ? ?Carcinoma of upper-outer quadrant of right female breast, unspecified estrogen receptor status ([place]) ? ?PERTINENT HISTORY: Patient was diagnosed on 02/21/2021 with right invasive lobular carcinoma breast cancer. She underwent a right lumpectomy and sentinel node biopsy (1 negative node) on 06/15/2021. 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  ? ?PRECAUTIONS: Other: R breast lymphedema ? ?SUBJECTIVE: I can tell we are definitely making some progress. The tenderness is a little better after every session and the edges of my swelling have softened significantly as well. Also I can tell there are areas of softness now that weren't there before. I got the swell spot and have  worn that just about every day as well.  ? ?PAIN:  ?Are you having pain? No ? ? ?OBJECTIVE: (objective measures completed at initial evaluation unless otherwise dated) ? ? ?COGNITION: ?           Overall cognitive status: Within functional limits for tasks assessed  ?  ?PALPATION: 2 large seromas palpable in superior and lateral breast and increased fibrosis in medial inferior breast ?  ?  ?POSTURE: forward head, rounded shoulders ?  ?UPPER EXTREMITY AROM/PROM: WFL ?  ?  ?UPPER EXTREMITY STRENGTH: 5/5 ?  ?  ?LYMPHEDEMA ASSESSMENTS:  ?  ?SURGERY TYPE/DATE: 06/15/21 (2 lumpectomies at same time) ?  ?NUMBER OF LYMPH  NODES REMOVED: 1 (was negative) ?  ?CHEMOTHERAPY: did not require ?  ?RADIATION: completed ?  ?HORMONE TREATMENT: was taking letrozole but stopped due to side effects ?  ?INFECTIONS: has taken antibiotics numerous times in the last year for infection in R breast ?  ?  ?  ?L-DEX LYMPHEDEMA SCREENING: ?  ?The patient was assessed using the L-Dex machine today to produce a lymphedema index baseline score. The patient will be reassessed on a regular basis (typically every 3 months) to obtain new L-Dex scores. If the score is > 6.5 points away from his/her baseline score indicating onset of subclinical lymphedema, it will be recommended to wear a compression garment for 4 weeks, 12 hours per day and then be reassessed. If the score continues to be > 6.5 points from baseline at reassessment, we will initiate lymphedema treatment. Assessing in this manner has a 95% rate of preventing clinically significant lymphedema. ?  ?  L-DEX FLOWSHEETS - 02/23/22 1100   ?  ?    ?     ?  L-DEX LYMPHEDEMA SCREENING  ?  Measurement Type Unilateral   ?  L-DEX MEASUREMENT EXTREMITY Upper Extremity   ?  POSITION  Standing   ?  DOMINANT SIDE Right   ?  At Risk Side Right   ?  BASELINE SCORE (UNILATERAL) 1.7   ?  L-DEX SCORE (UNILATERAL) 0.4   ?  VALUE CHANGE (UNILAT) -1.3   ?  ?   ?  ?  ?   ?  ?  ?TODAY'S TREATMENT  ? ?03/05/22: ?Manual Therapy:  ?Manual lymph drainage: In Supine with head of table slightly elevated: Short neck, superficial and deep abdominals, Lt axillary nodes and intact upper quadrant sequence, Rt inguinal nodes, anterior inter-axillary and Rt axillo-inguinal anastomosis, firmer pressure over seroma to soften then focused on Rt breast redirecting fluid towards anastomosis; Lt S/L for further work at lateral breast and around seroma and added poster inter-axillary anastomosis as well, then finished retracing all steps in supine reviewing pressure and correct skin stretch with pt throughout., also used mirror for visual cuing  at lateral aspect of breast.  ? ?03/02/2022: ?Manual Therapy:  ?Pt removed bra and swell spot; appropriated indentations from swell spot noted ? Manual lymph drainage: In Supine with head of table slightly elevated: Short neck, 5 diaphragmatic breaths, Lt axillary and Rt inguinal nodes, anterior inter-axillary and Rt axillo-inguinal anastomosis, firmer pressure over seroma to soften then focused on Rt breast redirecting fluid towards anastomosis; Lt S/L for further work at lateral breast and around seroma then finished retracing all steps in supine reviewing pressure and correct skin stretch with pt throughout. Also educated pt about firmer pressure used over varying areas of seroma,  Reviewed diaphragmatic breathing with pt and encouraged her to practice this at home. ? ?  02/28/22: ?Self Care: Educated pt about Flexitouch compression pump, answering her questions about this. She is interested in pursuing this at this time so permission given to fax her demographic information to [place].  ?Manual Therapy:  ? Manual lymph drainage: In Supine: Short neck, 5 diaphragmatic breaths, Lt axillary and Rt inguinal nodes, anterior inter-axillary and Rt axillo-inguinal anastomosis, then focused on Rt breast redirecting fluid towards anastomosis; Lt S/L for further work at lateral breast and around seroma then finished retracing all steps in supine reviewing pressure and correct skin stretch with pt throughout. Also firmer pressure used over varying areas of seroma, educating pt about this option as well, but always finishing with MLD, not firm pressure. Pt able to verbalize good understanding.  ? ?  ?PATIENT EDUCATION:  ?Education details: need for compression bra and swell spot ?Person educated: Patient ?Education method: Explanation and Handouts ?Education comprehension: verbalized understanding ?  ?  ?HOME EXERCISE PROGRAM: ?Begin wearing swell spot and new compression bra once obtained ?  ?ASSESSMENT: ?  ?CLINICAL  IMPRESSION: ?Continued manual lymph drainage with head of table slightly elevated. Continued using firmer pressure used at times over area of seroma prior to beginning and during to soften. Pt is tolerating wear

## 2022-03-06 ENCOUNTER — Encounter: Payer: Self-pay | Admitting: Adult Health

## 2022-03-06 DIAGNOSIS — N6321 Unspecified lump in the left breast, upper outer quadrant: Secondary | ICD-10-CM | POA: Diagnosis not present

## 2022-03-06 DIAGNOSIS — Z853 Personal history of malignant neoplasm of breast: Secondary | ICD-10-CM | POA: Diagnosis not present

## 2022-03-06 DIAGNOSIS — C50411 Malignant neoplasm of upper-outer quadrant of right female breast: Secondary | ICD-10-CM | POA: Diagnosis not present

## 2022-03-06 DIAGNOSIS — R922 Inconclusive mammogram: Secondary | ICD-10-CM | POA: Diagnosis not present

## 2022-03-07 ENCOUNTER — Ambulatory Visit: Payer: Medicare Other

## 2022-03-07 DIAGNOSIS — C50411 Malignant neoplasm of upper-outer quadrant of right female breast: Secondary | ICD-10-CM

## 2022-03-07 DIAGNOSIS — Z483 Aftercare following surgery for neoplasm: Secondary | ICD-10-CM | POA: Diagnosis not present

## 2022-03-07 DIAGNOSIS — R293 Abnormal posture: Secondary | ICD-10-CM

## 2022-03-07 DIAGNOSIS — R600 Localized edema: Secondary | ICD-10-CM

## 2022-03-07 DIAGNOSIS — I890 Lymphedema, not elsewhere classified: Secondary | ICD-10-CM

## 2022-03-07 NOTE — Therapy (Signed)
?OUTPATIENT PHYSICAL THERAPY TREATMENT NOTE ? ? ?Patient Name: Nichole Ritter ?MRN: [id number] ?DOB:Mar 18, 1951, 71 y.o., female ?Today's Date: 03/07/2022 ? ?PCP: [name], MD ?REFERRING PROVIDER: Thedore Mins, NP ? ?END OF SESSION:  ? PT End of Session - 03/07/22 0859   ? ? Visit Number 5   ? Number of Visits 9   ? Date for PT Re-Evaluation 03/23/22   ? PT Start Time 0900   ? PT Stop Time 0950   ? PT Time Calculation (min) 50 min   ? Activity Tolerance Patient tolerated treatment well   ? Behavior During Therapy [facility] for tasks assessed/performed   ? ?  ?  ? ?  ? ? ? ?Past Medical History:  ?Diagnosis Date  ? Allergic rhinitis   ? Aortic atherosclerosis ([place])   ? Arthritis   ? Barrett esophagus   ? Barrett's esophagus   ? Bunion   ? Cancer [facility])   ? right breast IMC  ? Family history of breast cancer   ? Family history of leukemia   ? Family history of lung cancer   ? Family history of ovarian cancer   ? Family history of prostate cancer   ? Female cystocele   ? GERD (gastroesophageal reflux disease)   ? History of epistaxis 2012  ? Hyperlipidemia   ? UNSPECIFIED  ? Lichen sclerosus   ? Obesity   ? Osteopenia   ? Personal history of colonic polyps   ? Rectocele   ? Situational stress   ? Stress incontinence   ? Vitamin D deficiency   ? ?Past Surgical History:  ?Procedure Laterality Date  ? BREAST LUMPECTOMY    ? BREAST LUMPECTOMY WITH RADIOACTIVE SEED AND SENTINEL LYMPH NODE BIOPSY Right 06/15/2021  ? Procedure: RIGHT BREAST LUMPECTOMY WITH RADIOACTIVE SEED X 2 AND SENTINEL LYMPH NODE BIOPSY;  Surgeon: Erroll Luna, MD;  Location: [place];  Service: General;  Laterality: Right;  ? CATARACT EXTRACTION Bilateral 02/2018  ? 03/2018  ? COLONOSCOPY  01/2013  ? LUMBAR DISC SURGERY    ? TUBAL LIGATION    ? ?Patient Active Problem List  ? Diagnosis Date Noted  ? Genetic testing 05/20/2021  ? Family history of prostate cancer 05/10/2021  ? Family history of breast cancer 05/10/2021  ? Family history of  ovarian cancer 05/10/2021  ? Family history of leukemia 05/10/2021  ? Family history of lung cancer 05/10/2021  ? Malignant neoplasm of upper-outer quadrant of right female breast ([place]) 05/05/2021  ? Aortic atherosclerosis ([place]) 01/06/2019  ? Mixed hyperlipidemia 01/06/2019  ? Family history of arteriosclerotic cardiovascular disease 01/06/2019  ? ? ?REFERRING DIAG: C50.411,Z17.0 (ICD-10-CM) - Malignant neoplasm of upper-outer quadrant of right breast in female, estrogen receptor positive ([place]) ? ?THERAPY DIAG:  ?Lymphedema, not elsewhere classified ? ?Aftercare following surgery for neoplasm ? ?Abnormal posture ? ?Carcinoma of upper-outer quadrant of right female breast, unspecified estrogen receptor status ([place]) ? ?Localized edema ? ?PERTINENT HISTORY: Patient was diagnosed on 02/21/2021 with right invasive lobular carcinoma breast cancer. She underwent a right lumpectomy and sentinel node biopsy (1 negative node) on 06/15/2021. 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  ? ?PRECAUTIONS: Other: R breast lymphedema ? ?SUBJECTIVE: I went to [employer] and got a smaller bra and it feels really good, but it doesn't fit with the swell spot. The mammogram picked up an enlarged LN on the left but they did [place] the LN and  they weren't concerned . They  will repeat the mammogram in 6 months. Doing self MLD. The edges of the firm spot seem to be rounder. I have bone density next Tuesday. ? ?PAIN:  ?Are you having pain? No, but tender from yesterdays mammogram ? ? ?OBJECTIVE: (objective measures completed at initial evaluation unless otherwise dated) ? ? ?COGNITION: ?           Overall cognitive status: Within functional limits for tasks assessed  ?  ?PALPATION: 2 large seromas palpable in superior and lateral breast and increased fibrosis in medial inferior breast ?  ?  ?POSTURE: forward head, rounded shoulders ?  ?UPPER EXTREMITY AROM/PROM: WFL ?  ?  ?UPPER EXTREMITY STRENGTH: 5/5 ?  ?   ?LYMPHEDEMA ASSESSMENTS:  ?  ?SURGERY TYPE/DATE: 06/15/21 (2 lumpectomies at same time) ?  ?NUMBER OF LYMPH NODES REMOVED: 1 (was negative) ?  ?CHEMOTHERAPY: did not require ?  ?RADIATION: completed ?  ?HORMONE TREATMENT: was taking letrozole but stopped due to side effects ?  ?INFECTIONS: has taken antibiotics numerous times in the last year for infection in R breast ?  ?  ?  ?L-DEX LYMPHEDEMA SCREENING: ?  ?The patient was assessed using the L-Dex machine today to produce a lymphedema index baseline score. The patient will be reassessed on a regular basis (typically every 3 months) to obtain new L-Dex scores. If the score is > 6.5 points away from his/her baseline score indicating onset of subclinical lymphedema, it will be recommended to wear a compression garment for 4 weeks, 12 hours per day and then be reassessed. If the score continues to be > 6.5 points from baseline at reassessment, we will initiate lymphedema treatment. Assessing in this manner has a 95% rate of preventing clinically significant lymphedema. ?  ?  L-DEX FLOWSHEETS - 02/23/22 1100   ?  ?    ?     ?  L-DEX LYMPHEDEMA SCREENING  ?  Measurement Type Unilateral   ?  L-DEX MEASUREMENT EXTREMITY Upper Extremity   ?  POSITION  Standing   ?  DOMINANT SIDE Right   ?  At Risk Side Right   ?  BASELINE SCORE (UNILATERAL) 1.7   ?  L-DEX SCORE (UNILATERAL) 0.4   ?  VALUE CHANGE (UNILAT) -1.3   ?  ?   ?  ?  ?   ?  ?  ?TODAY'S TREATMENT  ? ?03/07/2022 ? ? ? ? ? ?Manual Therapy:  ?Manual lymph drainage: In Supine with head of table slightly elevated: Short neck, superficial and deep abdominals, Lt axillary nodes and intact upper quadrant sequence, Rt inguinal nodes, anterior inter-axillary and Rt axillo-inguinal anastomosis, firmer pressure over seroma to soften then focused on Rt breast redirecting fluid towards anastomosis; Lt S/L for further work at lateral breast and around seroma and added poster inter-axillary anastomosis as well, then finished  retracing all steps in supine. Pt performed all techniques and return demonstrated good understanding with only a few VC's for stretch when performing on breast  ? ?03/05/22: ?Manual Therapy:  ?Manual lymph drainage: In Supine with head of table slightly elevated: Short neck, superficial and deep abdominals, Lt axillary nodes and intact upper quadrant sequence, Rt inguinal nodes, anterior inter-axillary and Rt axillo-inguinal anastomosis, firmer pressure over seroma to soften then focused on Rt breast redirecting fluid towards anastomosis; Lt S/L for further work at lateral breast and around seroma and added poster inter-axillary anastomosis as well, then finished retracing all steps in supine reviewing pressure and correct skin stretch with pt  throughout., also used mirror for visual cuing at lateral aspect of breast.  ? ?03/02/2022: ?Manual Therapy:  ?Pt removed bra and swell spot; appropriated indentations from swell spot noted ? Manual lymph drainage: In Supine with head of table slightly elevated: Short neck, 5 diaphragmatic breaths, Lt axillary and Rt inguinal nodes, anterior inter-axillary and Rt axillo-inguinal anastomosis, firmer pressure over seroma to soften then focused on Rt breast redirecting fluid towards anastomosis; Lt S/L for further work at lateral breast and around seroma then finished retracing all steps in supine reviewing pressure and correct skin stretch with pt throughout. Also educated pt about firmer pressure used over varying areas of seroma,  Reviewed diaphragmatic breathing with pt and encouraged her to practice this at home. ? ? 02/28/22: ?Self Care: Educated pt about Flexit[employer] compression pump, answering her questions about this. She is interested in pursuing this at this time so permission given to fax her demographic information to [place].  ?Manual Therapy:  ? Manual lymph drainage: In Supine: Short neck, 5 diaphragmatic breaths, Lt axillary and Rt inguinal nodes, anterior  inter-axillary and Rt axillo-inguinal anastomosis, then focused on Rt breast redirecting fluid towards anastomosis; Lt S/L for further work at lateral breast and around seroma then finished retracing all steps in supine reviewing

## 2022-03-12 ENCOUNTER — Encounter: Payer: Self-pay | Admitting: Physical Therapy

## 2022-03-12 ENCOUNTER — Ambulatory Visit: Payer: Medicare Other | Admitting: Physical Therapy

## 2022-03-12 DIAGNOSIS — I890 Lymphedema, not elsewhere classified: Secondary | ICD-10-CM

## 2022-03-12 DIAGNOSIS — C50411 Malignant neoplasm of upper-outer quadrant of right female breast: Secondary | ICD-10-CM

## 2022-03-12 DIAGNOSIS — R293 Abnormal posture: Secondary | ICD-10-CM | POA: Diagnosis not present

## 2022-03-12 DIAGNOSIS — Z483 Aftercare following surgery for neoplasm: Secondary | ICD-10-CM

## 2022-03-12 DIAGNOSIS — R600 Localized edema: Secondary | ICD-10-CM | POA: Diagnosis not present

## 2022-03-12 NOTE — Therapy (Signed)
?OUTPATIENT PHYSICAL THERAPY TREATMENT NOTE ? ? ?Patient Name: Nichole Ritter ?MRN: [id number] ?DOB:1951-10-10, 71 y.o., female ?Today's Date: 03/12/2022 ? ?PCP: [name], MD ?REFERRING PROVIDER: Thedore Mins, NP ? ?END OF SESSION:  ? PT End of Session - 03/12/22 1303   ? ? Visit Number 6   ? Number of Visits 9   ? Date for PT Re-Evaluation 03/23/22   ? PT Start Time 1302   ? PT Stop Time 4098   ? PT Time Calculation (min) 52 min   ? Activity Tolerance Patient tolerated treatment well   ? Behavior During Therapy [facility] for tasks assessed/performed   ? ?  ?  ? ?  ? ? ? ?Past Medical History:  ?Diagnosis Date  ? Allergic rhinitis   ? Aortic atherosclerosis ([place])   ? Arthritis   ? Barrett esophagus   ? Barrett's esophagus   ? Bunion   ? Cancer [facility])   ? right breast IMC  ? Family history of breast cancer   ? Family history of leukemia   ? Family history of lung cancer   ? Family history of ovarian cancer   ? Family history of prostate cancer   ? Female cystocele   ? GERD (gastroesophageal reflux disease)   ? History of epistaxis 2012  ? Hyperlipidemia   ? UNSPECIFIED  ? Lichen sclerosus   ? Obesity   ? Osteopenia   ? Personal history of colonic polyps   ? Rectocele   ? Situational stress   ? Stress incontinence   ? Vitamin D deficiency   ? ?Past Surgical History:  ?Procedure Laterality Date  ? BREAST LUMPECTOMY    ? BREAST LUMPECTOMY WITH RADIOACTIVE SEED AND SENTINEL LYMPH NODE BIOPSY Right 06/15/2021  ? Procedure: RIGHT BREAST LUMPECTOMY WITH RADIOACTIVE SEED X 2 AND SENTINEL LYMPH NODE BIOPSY;  Surgeon: Erroll Luna, MD;  Location: [place];  Service: General;  Laterality: Right;  ? CATARACT EXTRACTION Bilateral 02/2018  ? 03/2018  ? COLONOSCOPY  01/2013  ? LUMBAR DISC SURGERY    ? TUBAL LIGATION    ? ?Patient Active Problem List  ? Diagnosis Date Noted  ? Genetic testing 05/20/2021  ? Family history of prostate cancer 05/10/2021  ? Family history of breast cancer 05/10/2021  ? Family history of  ovarian cancer 05/10/2021  ? Family history of leukemia 05/10/2021  ? Family history of lung cancer 05/10/2021  ? Malignant neoplasm of upper-outer quadrant of right female breast ([place]) 05/05/2021  ? Aortic atherosclerosis ([place]) 01/06/2019  ? Mixed hyperlipidemia 01/06/2019  ? Family history of arteriosclerotic cardiovascular disease 01/06/2019  ? ? ?REFERRING DIAG: C50.411,Z17.0 (ICD-10-CM) - Malignant neoplasm of upper-outer quadrant of right breast in female, estrogen receptor positive ([place]) ? ?THERAPY DIAG:  ?Lymphedema, not elsewhere classified ? ?Aftercare following surgery for neoplasm ? ?Abnormal posture ? ?Carcinoma of upper-outer quadrant of right female breast, unspecified estrogen receptor status ([place]) ? ?PERTINENT HISTORY: Patient was diagnosed on 02/21/2021 with right invasive lobular carcinoma breast cancer. She underwent a right lumpectomy and sentinel node biopsy (1 negative node) on 06/15/2021. 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  ? ?PRECAUTIONS: Other: R breast lymphedema ? ?SUBJECTIVE: I feel like it does not feel as firm as it did and feels a bit smaller. I have been wearing the swell spot. I am feeling good.  ? ?PAIN:  ?Are you having pain? No, but has tenderness ? ? ?OBJECTIVE: (objective measures completed at initial evaluation unless  otherwise dated) ? ? ?COGNITION: ?           Overall cognitive status: Within functional limits for tasks assessed  ?  ?PALPATION: 2 large seromas palpable in superior and lateral breast and increased fibrosis in medial inferior breast ?  ?  ?POSTURE: forward head, rounded shoulders ?  ?UPPER EXTREMITY AROM/PROM: WFL ?  ?  ?UPPER EXTREMITY STRENGTH: 5/5 ?  ?  ?LYMPHEDEMA ASSESSMENTS:  ?  ?SURGERY TYPE/DATE: 06/15/21 (2 lumpectomies at same time) ?  ?NUMBER OF LYMPH NODES REMOVED: 1 (was negative) ?  ?CHEMOTHERAPY: did not require ?  ?RADIATION: completed ?  ?HORMONE TREATMENT: was taking letrozole but stopped due to side effects ?   ?INFECTIONS: has taken antibiotics numerous times in the last year for infection in R breast ?  ?  ?  ?L-DEX LYMPHEDEMA SCREENING: ?  ?The patient was assessed using the L-Dex machine today to produce a lymphedema index baseline score. The patient will be reassessed on a regular basis (typically every 3 months) to obtain new L-Dex scores. If the score is > 6.5 points away from his/her baseline score indicating onset of subclinical lymphedema, it will be recommended to wear a compression garment for 4 weeks, 12 hours per day and then be reassessed. If the score continues to be > 6.5 points from baseline at reassessment, we will initiate lymphedema treatment. Assessing in this manner has a 95% rate of preventing clinically significant lymphedema. ?  ?  L-DEX FLOWSHEETS - 02/23/22 1100   ?  ?    ?     ?  L-DEX LYMPHEDEMA SCREENING  ?  Measurement Type Unilateral   ?  L-DEX MEASUREMENT EXTREMITY Upper Extremity   ?  POSITION  Standing   ?  DOMINANT SIDE Right   ?  At Risk Side Right   ?  BASELINE SCORE (UNILATERAL) 1.7   ?  L-DEX SCORE (UNILATERAL) 0.4   ?  VALUE CHANGE (UNILAT) -1.3   ?  ?   ?  ?  ?   ?  ?  ?TODAY'S TREATMENT  ?03/12/2022 ?Manual Therapy:  ?Manual lymph drainage: In Supine with head of table slightly elevated: Short neck, superficial and deep abdominals, Lt axillary nodes and intact upper quadrant sequence, Rt inguinal nodes, anterior inter-axillary and Rt axillo-inguinal anastomosis, firmer pressure over seroma to soften then focused on Rt breast spending increased time on superior breast in area of seroma redirecting fluid towards anastomosis then finished retracing all steps in supine.  ? ? ?03/07/2022 ?Manual Therapy:  ?Manual lymph drainage: In Supine with head of table slightly elevated: Short neck, superficial and deep abdominals, Lt axillary nodes and intact upper quadrant sequence, Rt inguinal nodes, anterior inter-axillary and Rt axillo-inguinal anastomosis, firmer pressure over seroma to  soften then focused on Rt breast redirecting fluid towards anastomosis; Lt S/L for further work at lateral breast and around seroma and added poster inter-axillary anastomosis as well, then finished retracing all steps in supine. Pt performed all techniques and return demonstrated good understanding with only a few VC's for stretch when performing on breast  ? ?03/05/22: ?Manual Therapy:  ?Manual lymph drainage: In Supine with head of table slightly elevated: Short neck, superficial and deep abdominals, Lt axillary nodes and intact upper quadrant sequence, Rt inguinal nodes, anterior inter-axillary and Rt axillo-inguinal anastomosis, firmer pressure over seroma to soften then focused on Rt breast redirecting fluid towards anastomosis; Lt S/L for further work at lateral breast and around seroma and added poster inter-axillary  anastomosis as well, then finished retracing all steps in supine reviewing pressure and correct skin stretch with pt throughout., also used mirror for visual cuing at lateral aspect of breast.  ? ?03/02/2022: ?Manual Therapy:  ?Pt removed bra and swell spot; appropriated indentations from swell spot noted ? Manual lymph drainage: In Supine with head of table slightly elevated: Short neck, 5 diaphragmatic breaths, Lt axillary and Rt inguinal nodes, anterior inter-axillary and Rt axillo-inguinal anastomosis, firmer pressure over seroma to soften then focused on Rt breast redirecting fluid towards anastomosis; Lt S/L for further work at lateral breast and around seroma then finished retracing all steps in supine reviewing pressure and correct skin stretch with pt throughout. Also educated pt about firmer pressure used over varying areas of seroma,  Reviewed diaphragmatic breathing with pt and encouraged her to practice this at home. ? ?  ?PATIENT EDUCATION:  ?Education details: need for compression bra and swell spot ?Person educated: Patient ?Education method: Explanation and Handouts ?Education  comprehension: verbalized understanding ?  ?  ?HOME EXERCISE PROGRAM: ?Begin wearing swell spot and new compression bra once obtained ?  ?ASSESSMENT: ?  ?CLINICAL IMPRESSION: ?Continued manual lymph drainage with

## 2022-03-13 ENCOUNTER — Encounter: Payer: Self-pay | Admitting: Adult Health

## 2022-03-13 DIAGNOSIS — M85852 Other specified disorders of bone density and structure, left thigh: Secondary | ICD-10-CM | POA: Diagnosis not present

## 2022-03-13 DIAGNOSIS — Z780 Asymptomatic menopausal state: Secondary | ICD-10-CM | POA: Diagnosis not present

## 2022-03-13 DIAGNOSIS — M85851 Other specified disorders of bone density and structure, right thigh: Secondary | ICD-10-CM | POA: Diagnosis not present

## 2022-03-20 ENCOUNTER — Ambulatory Visit: Payer: Medicare Other

## 2022-03-21 ENCOUNTER — Ambulatory Visit: Payer: Medicare Other

## 2022-03-21 DIAGNOSIS — R600 Localized edema: Secondary | ICD-10-CM | POA: Diagnosis not present

## 2022-03-21 DIAGNOSIS — Z483 Aftercare following surgery for neoplasm: Secondary | ICD-10-CM

## 2022-03-21 DIAGNOSIS — C50411 Malignant neoplasm of upper-outer quadrant of right female breast: Secondary | ICD-10-CM | POA: Diagnosis not present

## 2022-03-21 DIAGNOSIS — R293 Abnormal posture: Secondary | ICD-10-CM

## 2022-03-21 DIAGNOSIS — I890 Lymphedema, not elsewhere classified: Secondary | ICD-10-CM

## 2022-03-21 NOTE — Therapy (Signed)
OUTPATIENT PHYSICAL THERAPY TREATMENT NOTE   Patient Name: Nichole Ritter MRN: [id number] DOB:[birth year], 71 y.o., female Today's Date: 03/21/2022  PCP: [name], MD REFERRING PROVIDER: Thedore Mins, NP  END OF SESSION:   PT End of Session - 03/21/22 1110     Visit Number 7    Number of Visits 9    Date for PT Re-Evaluation 03/23/22    PT Start Time 1105    PT Stop Time 1200    PT Time Calculation (min) 55 min    Activity Tolerance Patient tolerated treatment well    Behavior During Therapy WFL for tasks assessed/performed              Past Medical History:  Diagnosis Date   Allergic rhinitis    Aortic atherosclerosis ([place])    Arthritis    Barrett esophagus    Barrett's esophagus    Bunion    Cancer ([place])    right breast IMC   Family history of breast cancer    Family history of leukemia    Family history of lung cancer    Family history of ovarian cancer    Family history of prostate cancer    Female cystocele    GERD (gastroesophageal reflux disease)    History of epistaxis 2012   Hyperlipidemia    UNSPECIFIED   Lichen sclerosus    Obesity    Osteopenia    Personal history of colonic polyps    Rectocele    Situational stress    Stress incontinence    Vitamin D deficiency    Past Surgical History:  Procedure Laterality Date   BREAST LUMPECTOMY     BREAST LUMPECTOMY WITH RADIOACTIVE SEED AND SENTINEL LYMPH NODE BIOPSY Right 06/15/2021   Procedure: RIGHT BREAST LUMPECTOMY WITH RADIOACTIVE SEED X 2 AND SENTINEL LYMPH NODE BIOPSY;  Surgeon: Erroll Luna, MD;  Location: [facility];  Service: General;  Laterality: Right;   CATARACT EXTRACTION Bilateral 02/2018   03/2018   COLONOSCOPY  01/2013   LUMBAR [place] SURGERY     TUBAL LIGATION     Patient Active Problem List   Diagnosis Date Noted   Genetic testing 05/20/2021   Family history of prostate cancer 05/10/2021   Family history of breast cancer 05/10/2021   Family history of  ovarian cancer 05/10/2021   Family history of leukemia 05/10/2021   Family history of lung cancer 05/10/2021   Malignant neoplasm of upper-outer quadrant of right female breast ([place]) 05/05/2021   Aortic atherosclerosis ([place]) 01/06/2019   Mixed hyperlipidemia 01/06/2019   Family history of arteriosclerotic cardiovascular disease 01/06/2019    REFERRING DIAG: C50.411,Z17.0 (ICD-10-CM) - Malignant neoplasm of upper-outer quadrant of right breast in female, estrogen receptor positive ([place])  THERAPY DIAG:  Lymphedema, not elsewhere classified  Aftercare following surgery for neoplasm  Abnormal posture  Carcinoma of upper-outer quadrant of right female breast, unspecified estrogen receptor status ([place])  PERTINENT HISTORY: Patient was diagnosed on 02/21/2021 with right invasive lobular carcinoma breast cancer. She underwent a right lumpectomy and sentinel node biopsy (1 negative node) on 06/15/2021. 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   PRECAUTIONS: Other: R breast lymphedema  SUBJECTIVE: I am doing the self MLD 2x/day and I can tell the edges of the seroma aren't as firm and I believe the general area feels smaller.  PAIN:  Are you having pain? No, but has tenderness   OBJECTIVE: (objective measures completed at initial evaluation unless otherwise dated)  COGNITION:            Overall cognitive status: Within functional limits for tasks assessed    PALPATION: 2 large seromas palpable in superior and lateral breast and increased fibrosis in medial inferior breast     POSTURE: forward head, rounded shoulders   UPPER EXTREMITY AROM/PROM: WFL     UPPER EXTREMITY STRENGTH: 5/5     LYMPHEDEMA ASSESSMENTS:    SURGERY TYPE/DATE: 06/15/21 (2 lumpectomies at same time)   NUMBER OF LYMPH NODES REMOVED: 1 (was negative)   CHEMOTHERAPY: did not require   RADIATION: completed   HORMONE TREATMENT: was taking letrozole but stopped due to side effects    INFECTIONS: has taken antibiotics numerous times in the last year for infection in R breast       L-DEX LYMPHEDEMA SCREENING:   The patient was assessed using the L-Dex machine today to produce a lymphedema index baseline score. The patient will be reassessed on a regular basis (typically every 3 months) to obtain new L-Dex scores. If the score is > 6.5 points away from his/her baseline score indicating onset of subclinical lymphedema, it will be recommended to wear a compression garment for 4 weeks, 12 hours per day and then be reassessed. If the score continues to be > 6.5 points from baseline at reassessment, we will initiate lymphedema treatment. Assessing in this manner has a 95% rate of preventing clinically significant lymphedema.     L-DEX FLOWSHEETS - 02/23/22 1100                L-DEX LYMPHEDEMA SCREENING    Measurement Type Unilateral     L-DEX MEASUREMENT EXTREMITY Upper Extremity     POSITION  Standing     DOMINANT SIDE Right     At Risk Side Right     BASELINE SCORE (UNILATERAL) 1.7     L-DEX SCORE (UNILATERAL) 0.4     VALUE CHANGE (UNILAT) -1.3                   TODAY'S TREATMENT   03/21/22: Manual Therapy:  Manual lymph drainage: In Supine with head of table slightly elevated: Short neck, superficial and deep abdominals, Lt axillary nodes and intact upper quadrant sequence, Rt inguinal nodes, anterior inter-axillary and Rt axillo-inguinal anastomosis, firmer pressure over seroma to soften then focused on Rt breast spending increased time on superior breast in area of seroma redirecting fluid towards anastomosis then finished retracing all steps in supine.   03/12/2022 Manual Therapy:  Manual lymph drainage: In Supine with head of table slightly elevated: Short neck, superficial and deep abdominals, Lt axillary nodes and intact upper quadrant sequence, Rt inguinal nodes, anterior inter-axillary and Rt axillo-inguinal anastomosis, firmer pressure over seroma to  soften then focused on Rt breast spending increased time on superior breast in area of seroma redirecting fluid towards anastomosis then finished retracing all steps in supine.    03/07/2022 Manual Therapy:  Manual lymph drainage: In Supine with head of table slightly elevated: Short neck, superficial and deep abdominals, Lt axillary nodes and intact upper quadrant sequence, Rt inguinal nodes, anterior inter-axillary and Rt axillo-inguinal anastomosis, firmer pressure over seroma to soften then focused on Rt breast redirecting fluid towards anastomosis; Lt S/L for further work at lateral breast and around seroma and added poster inter-axillary anastomosis as well, then finished retracing all steps in supine. Pt performed all techniques and return demonstrated good understanding with only a few VC's for stretch when performing on breast  PATIENT EDUCATION:  Education details: need for compression bra and swell spot Person educated: Patient Education method: Explanation and Handouts Education comprehension: verbalized understanding     HOME EXERCISE PROGRAM: Begin wearing swell spot and new compression bra once obtained   ASSESSMENT:   CLINICAL IMPRESSION: Continued manual lymph drainage with head of table slightly elevated. Continued using firmer pressure at times over area of seroma during MLD to soften. Pt is doing well with working on consistency with the Maintenance Phase of her treatment by performing her self MLD 2x/day and wears her compression bra with swell spot daily also. Overall, softening of the borders of her seroma are noted and the general area isn't as large as it initially was. Pt has not heard from [place] yet.      OBJECTIVE IMPAIRMENTS increased edema, postural dysfunction, and pain.    ACTIVITY LIMITATIONS  None .    PERSONAL FACTORS No personal factors are also affecting patient's functional outcome.      REHAB POTENTIAL: Good   CLINICAL DECISION  MAKING: Stable/uncomplicated   EVALUATION COMPLEXITY: Low   GOALS: Goals reviewed with patient? Yes   SHORT TERM GOALS: Target date: 03/09/2022   Pt will obtain a compression bra and swell spot for long term management of lymphedema. Baseline: Goal status: INITIAL     LONG TERM GOALS: Target date: 03/23/2022   Pt will be independent in self MLD for long term management of lymphedema. Baseline:  Goal status: INITIAL   2.  Pt will receive trial of FlexiTouch compression pump for long term management of lymphedema. Baseline:  Goal status: INITIAL   3.  Pt will report a 50% improvement in swelling and fibrosis in R breast to improve comfort.  Baseline:  Goal status: INITIAL     PLAN: PT FREQUENCY: 2x/week   PT DURATION: 4 weeks   PLANNED INTERVENTIONS: Therapeutic exercises, Patient/Family education, Manual lymph drainage, Compression bandaging, scar mobilization, Taping, Vasopneumatic device, and Manual therapy   PLAN FOR NEXT SESSION: Cont MLD to R breast, Heard from Flexitouch (this may not be until next week)?    Otelia Limes, PTA 03/21/2022, 12:07 PM

## 2022-03-22 ENCOUNTER — Ambulatory Visit: Payer: Medicare Other

## 2022-03-22 DIAGNOSIS — R293 Abnormal posture: Secondary | ICD-10-CM | POA: Diagnosis not present

## 2022-03-22 DIAGNOSIS — C50411 Malignant neoplasm of upper-outer quadrant of right female breast: Secondary | ICD-10-CM

## 2022-03-22 DIAGNOSIS — Z483 Aftercare following surgery for neoplasm: Secondary | ICD-10-CM | POA: Diagnosis not present

## 2022-03-22 DIAGNOSIS — R600 Localized edema: Secondary | ICD-10-CM | POA: Diagnosis not present

## 2022-03-22 DIAGNOSIS — I890 Lymphedema, not elsewhere classified: Secondary | ICD-10-CM

## 2022-03-22 NOTE — Therapy (Signed)
OUTPATIENT PHYSICAL THERAPY TREATMENT NOTE   Patient Name: Nichole Ritter MRN: [id number] DOB:Dec 24, 1950, 71 y.o., female Today's Date: 03/22/2022  PCP: [name], MD REFERRING PROVIDER: Thedore Mins, NP  END OF SESSION:   PT End of Session - 03/22/22 1108     Visit Number 8    Number of Visits 9    Date for PT Re-Evaluation 03/23/22    PT Start Time 61    PT Stop Time 1017    PT Time Calculation (min) 58 min    Activity Tolerance Patient tolerated treatment well    Behavior During Therapy WFL for tasks assessed/performed              Past Medical History:  Diagnosis Date   Allergic rhinitis    Aortic atherosclerosis ([place])    Arthritis    Barrett esophagus    Barrett's esophagus    Bunion    Cancer ([place])    right breast IMC   Family history of breast cancer    Family history of leukemia    Family history of lung cancer    Family history of ovarian cancer    Family history of prostate cancer    Female cystocele    GERD (gastroesophageal reflux disease)    History of epistaxis 2012   Hyperlipidemia    UNSPECIFIED   Lichen sclerosus    Obesity    Osteopenia    Personal history of colonic polyps    Rectocele    Situational stress    Stress incontinence    Vitamin D deficiency    Past Surgical History:  Procedure Laterality Date   BREAST LUMPECTOMY     BREAST LUMPECTOMY WITH RADIOACTIVE SEED AND SENTINEL LYMPH NODE BIOPSY Right 06/15/2021   Procedure: RIGHT BREAST LUMPECTOMY WITH RADIOACTIVE SEED X 2 AND SENTINEL LYMPH NODE BIOPSY;  Surgeon: Erroll Luna, MD;  Location: [facility];  Service: General;  Laterality: Right;   CATARACT EXTRACTION Bilateral 02/2018   03/2018   COLONOSCOPY  01/2013   LUMBAR [place] SURGERY     TUBAL LIGATION     Patient Active Problem List   Diagnosis Date Noted   Genetic testing 05/20/2021   Family history of prostate cancer 05/10/2021   Family history of breast cancer 05/10/2021   Family history of  ovarian cancer 05/10/2021   Family history of leukemia 05/10/2021   Family history of lung cancer 05/10/2021   Malignant neoplasm of upper-outer quadrant of right female breast ([place]) 05/05/2021   Aortic atherosclerosis ([place]) 01/06/2019   Mixed hyperlipidemia 01/06/2019   Family history of arteriosclerotic cardiovascular disease 01/06/2019    REFERRING DIAG: C50.411,Z17.0 (ICD-10-CM) - Malignant neoplasm of upper-outer quadrant of right breast in female, estrogen receptor positive ([place])  THERAPY DIAG:  Lymphedema, not elsewhere classified  Aftercare following surgery for neoplasm  Abnormal posture  Carcinoma of upper-outer quadrant of right female breast, unspecified estrogen receptor status ([place])  PERTINENT HISTORY: Patient was diagnosed on 02/21/2021 with right invasive lobular carcinoma breast cancer. She underwent a right lumpectomy and sentinel node biopsy (1 negative node) on 06/15/2021. 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   PRECAUTIONS: Other: R breast lymphedema  SUBJECTIVE: I am pleased with how improved the tenderness has gotten in my breast.    PAIN:  Are you having pain? No, but has tenderness   OBJECTIVE: (objective measures completed at initial evaluation unless otherwise dated)   COGNITION:  Overall cognitive status: Within functional limits for tasks assessed    PALPATION: 2 large seromas palpable in superior and lateral breast and increased fibrosis in medial inferior breast     POSTURE: forward head, rounded shoulders   UPPER EXTREMITY AROM/PROM: WFL     UPPER EXTREMITY STRENGTH: 5/5     LYMPHEDEMA ASSESSMENTS:    SURGERY TYPE/DATE: 06/15/21 (2 lumpectomies at same time)   NUMBER OF LYMPH NODES REMOVED: 1 (was negative)   CHEMOTHERAPY: did not require   RADIATION: completed   HORMONE TREATMENT: was taking letrozole but stopped due to side effects   INFECTIONS: has taken antibiotics numerous times in  the last year for infection in R breast       L-DEX LYMPHEDEMA SCREENING:   The patient was assessed using the L-Dex machine today to produce a lymphedema index baseline score. The patient will be reassessed on a regular basis (typically every 3 months) to obtain new L-Dex scores. If the score is > 6.5 points away from his/her baseline score indicating onset of subclinical lymphedema, it will be recommended to wear a compression garment for 4 weeks, 12 hours per day and then be reassessed. If the score continues to be > 6.5 points from baseline at reassessment, we will initiate lymphedema treatment. Assessing in this manner has a 95% rate of preventing clinically significant lymphedema.     L-DEX FLOWSHEETS - 02/23/22 1100                L-DEX LYMPHEDEMA SCREENING    Measurement Type Unilateral     L-DEX MEASUREMENT EXTREMITY Upper Extremity     POSITION  Standing     DOMINANT SIDE Right     At Risk Side Right     BASELINE SCORE (UNILATERAL) 1.7     L-DEX SCORE (UNILATERAL) 0.4     VALUE CHANGE (UNILAT) -1.3                   TODAY'S TREATMENT   03/22/22: Manual Therapy:  Manual lymph drainage: In Supine with head of table slightly elevated: Short neck, superficial and deep abdominals, Lt axillary nodes and intact upper quadrant sequence, Rt inguinal nodes, anterior inter-axillary and Rt axillo-inguinal anastomosis, firmer pressure over seroma to soften then focused on Rt breast spending increased time on superior breast in area of seroma redirecting fluid towards anastomosis then finished retracing all steps.  03/21/22: Manual Therapy:  Manual lymph drainage: In Supine with head of table slightly elevated: Short neck, superficial and deep abdominals, Lt axillary nodes and intact upper quadrant sequence, Rt inguinal nodes, anterior inter-axillary and Rt axillo-inguinal anastomosis, firmer pressure over seroma to soften then focused on Rt breast spending increased time on superior  breast in area of seroma redirecting fluid towards anastomosis then finished retracing all steps in supine.   03/12/2022 Manual Therapy:  Manual lymph drainage: In Supine with head of table slightly elevated: Short neck, superficial and deep abdominals, Lt axillary nodes and intact upper quadrant sequence, Rt inguinal nodes, anterior inter-axillary and Rt axillo-inguinal anastomosis, firmer pressure over seroma to soften then focused on Rt breast spending increased time on superior breast in area of seroma redirecting fluid towards anastomosis then finished retracing all steps in supine.       PATIENT EDUCATION:  Education details: need for compression bra and swell spot Person educated: Patient Education method: Explanation and Handouts Education comprehension: verbalized understanding     HOME EXERCISE PROGRAM: Begin wearing swell spot and new compression bra  once obtained   ASSESSMENT:   CLINICAL IMPRESSION: Continued manual lymph drainage with head of table slightly elevated and firmer pressure applied to area of seroma throughout MLD. Pt reports her tenderness that was around seroma initially is much improved. Medial aspect of aspect of breast was some improved today with skin appearing wrinkled due to less edema present.    OBJECTIVE IMPAIRMENTS increased edema, postural dysfunction, and pain.    ACTIVITY LIMITATIONS  None .    PERSONAL FACTORS No personal factors are also affecting patient's functional outcome.      REHAB POTENTIAL: Good   CLINICAL DECISION MAKING: Stable/uncomplicated   EVALUATION COMPLEXITY: Low   GOALS: Goals reviewed with patient? Yes   SHORT TERM GOALS: Target date: 03/09/2022   Pt will obtain a compression bra and swell spot for long term management of lymphedema. Baseline: Goal status: INITIAL     LONG TERM GOALS: Target date: 03/23/2022   Pt will be independent in self MLD for long term management of lymphedema. Baseline:  Goal status:  INITIAL   2.  Pt will receive trial of FlexiTouch compression pump for long term management of lymphedema. Baseline:  Goal status: INITIAL   3.  Pt will report a 50% improvement in swelling and fibrosis in R breast to improve comfort.  Baseline:  Goal status: INITIAL     PLAN: PT FREQUENCY: 2x/week   PT DURATION: 4 weeks   PLANNED INTERVENTIONS: Therapeutic exercises, Patient/Family education, Manual lymph drainage, Compression bandaging, scar mobilization, Taping, Vasopneumatic device, and Manual therapy   PLAN FOR NEXT SESSION: Cont MLD to R breast, Heard from Flexitouch (this may not be until next week)?    Otelia Limes, PTA 03/22/2022, 12:06 PM

## 2022-03-28 ENCOUNTER — Ambulatory Visit: Payer: Medicare Other

## 2022-03-28 DIAGNOSIS — R293 Abnormal posture: Secondary | ICD-10-CM

## 2022-03-28 DIAGNOSIS — Z483 Aftercare following surgery for neoplasm: Secondary | ICD-10-CM

## 2022-03-28 DIAGNOSIS — I890 Lymphedema, not elsewhere classified: Secondary | ICD-10-CM

## 2022-03-28 DIAGNOSIS — C50411 Malignant neoplasm of upper-outer quadrant of right female breast: Secondary | ICD-10-CM

## 2022-03-28 NOTE — Therapy (Signed)
OUTPATIENT PHYSICAL THERAPY TREATMENT NOTE   Patient Name: Nichole Ritter MRN: [id number] DOB:03/31/1951, 71 y.o., female Today's Date: 03/28/2022  PCP: [name], MD REFERRING PROVIDER: Thedore Mins, NP  END OF SESSION:   PT End of Session - 03/28/22 1030     Visit Number 8   # unchanged due to no charge today   Number of Visits 9    Date for PT Re-Evaluation 03/23/22   no charge, will renew next   PT Start Time 1008    PT Stop Time 1025    PT Time Calculation (min) 17 min    Activity Tolerance Treatment limited secondary to medical complications (Comment)    Behavior During Therapy [facility] ([place]) for tasks assessed/performed              Past Medical History:  Diagnosis Date   Allergic rhinitis    Aortic atherosclerosis ([place])    Arthritis    Barrett esophagus    Barrett's esophagus    Bunion    Cancer ([place])    right breast [facility]   Family history of breast cancer    Family history of leukemia    Family history of lung cancer    Family history of ovarian cancer    Family history of prostate cancer    Female cystocele    GERD (gastroesophageal reflux disease)    History of epistaxis 2012   Hyperlipidemia    UNSPECIFIED   Lichen sclerosus    Obesity    Osteopenia    Personal history of colonic polyps    Rectocele    Situational stress    Stress incontinence    Vitamin D deficiency    Past Surgical History:  Procedure Laterality Date   BREAST LUMPECTOMY     BREAST LUMPECTOMY WITH RADIOACTIVE SEED AND SENTINEL LYMPH NODE BIOPSY Right 06/15/2021   Procedure: RIGHT BREAST LUMPECTOMY WITH RADIOACTIVE SEED X 2 AND SENTINEL LYMPH NODE BIOPSY;  Surgeon: Erroll Luna, MD;  Location: [place];  Service: General;  Laterality: Right;   CATARACT EXTRACTION Bilateral 02/2018   03/2018   COLONOSCOPY  01/2013   LUMBAR [place] SURGERY     TUBAL LIGATION     Patient Active Problem List   Diagnosis Date Noted   Genetic testing 05/20/2021   Family history of  prostate cancer 05/10/2021   Family history of breast cancer 05/10/2021   Family history of ovarian cancer 05/10/2021   Family history of leukemia 05/10/2021   Family history of lung cancer 05/10/2021   Malignant neoplasm of upper-outer quadrant of right female breast ([place]) 05/05/2021   Aortic atherosclerosis ([place]) 01/06/2019   Mixed hyperlipidemia 01/06/2019   Family history of arteriosclerotic cardiovascular disease 01/06/2019    REFERRING DIAG: C50.411,Z17.0 (ICD-10-CM) - Malignant neoplasm of upper-outer quadrant of right breast in female, estrogen receptor positive ([place])  THERAPY DIAG:  Lymphedema, not elsewhere classified  Aftercare following surgery for neoplasm  Abnormal posture  Carcinoma of upper-outer quadrant of right female breast, unspecified estrogen receptor status ([place])  PERTINENT HISTORY: Patient was diagnosed on 02/21/2021 with right invasive lobular carcinoma breast cancer. She underwent a right lumpectomy and sentinel node biopsy (1 negative node) on 06/15/2021. 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   PRECAUTIONS: Other: R breast lymphedema  SUBJECTIVE: When I got out of the shower this morning I noticed my Rt breast was red and it was really itchy.    PAIN:  Are you having pain? No, but has  tenderness   OBJECTIVE: (objective measures completed at initial evaluation unless otherwise dated)   COGNITION:            Overall cognitive status: Within functional limits for tasks assessed    PALPATION: 2 large seromas palpable in superior and lateral breast and increased fibrosis in medial inferior breast     POSTURE: forward head, rounded shoulders   UPPER EXTREMITY AROM/PROM: WFL     UPPER EXTREMITY STRENGTH: 5/5     LYMPHEDEMA ASSESSMENTS:    SURGERY TYPE/DATE: 06/15/21 (2 lumpectomies at same time)   NUMBER OF LYMPH NODES REMOVED: 1 (was negative)   CHEMOTHERAPY: did not require   RADIATION: completed   HORMONE  TREATMENT: was taking letrozole but stopped due to side effects   INFECTIONS: has taken antibiotics numerous times in the last year for infection in R breast       L-DEX LYMPHEDEMA SCREENING:   The patient was assessed using the L-Dex machine today to produce a lymphedema index baseline score. The patient will be reassessed on a regular basis (typically every 3 months) to obtain new L-Dex scores. If the score is > 6.5 points away from his/her baseline score indicating onset of subclinical lymphedema, it will be recommended to wear a compression garment for 4 weeks, 12 hours per day and then be reassessed. If the score continues to be > 6.5 points from baseline at reassessment, we will initiate lymphedema treatment. Assessing in this manner has a 95% rate of preventing clinically significant lymphedema.     L-DEX FLOWSHEETS - 02/23/22 1100                L-DEX LYMPHEDEMA SCREENING    Measurement Type Unilateral     L-DEX MEASUREMENT EXTREMITY Upper Extremity     POSITION  Standing     DOMINANT SIDE Right     At Risk Side Right     BASELINE SCORE (UNILATERAL) 1.7     L-DEX SCORE (UNILATERAL) 0.4     VALUE CHANGE (UNILAT) -1.3                   TODAY'S TREATMENT   03/28/22: No treatment today. Upon inspection after pts report of new redness and itchiness it was noted that majority of her breast, more so over area of seroma, is very red today and warm to touch. Took a picture that is under media tab in pts chart and sent inbox message to Wilber Bihari to update her about pts change in medical status requesting she be called today. Pt has heard from [facility] and she is scheduled to have her demo Friday. Advised her to R/S this if does has cellulitis and is still red. Also advised her to hold off on self MLD for next few days with same reasoning. Pt able to verbalize good understanding of all. Also encouraged her to call Mendel Ryder if she doesn't hear from her by lunch as I feel she should  be seen today. [place] [employer] and [employer] reps phone numbers to pt before she left also.   03/22/22: Manual Therapy:  Manual lymph drainage: In Supine with head of table slightly elevated: Short neck, superficial and deep abdominals, Lt axillary nodes and intact upper quadrant sequence, Rt inguinal nodes, anterior inter-axillary and Rt axillo-inguinal anastomosis, firmer pressure over seroma to soften then focused on Rt breast spending increased time on superior breast in area of seroma redirecting fluid towards anastomosis then finished retracing all steps.  03/21/22: Manual Therapy:  Manual lymph drainage: In Supine with head of table slightly elevated: Short neck, superficial and deep abdominals, Lt axillary nodes and intact upper quadrant sequence, Rt inguinal nodes, anterior inter-axillary and Rt axillo-inguinal anastomosis, firmer pressure over seroma to soften then focused on Rt breast spending increased time on superior breast in area of seroma redirecting fluid towards anastomosis then finished retracing all steps in supine.       PATIENT EDUCATION:  Education details: need for compression bra and swell spot Person educated: Patient Education method: Theatre stage manager Education comprehension: verbalized understanding     HOME EXERCISE PROGRAM: Begin wearing swell spot and new compression bra once obtained   ASSESSMENT:   CLINICAL IMPRESSION: No treatment today so pt charged "Arrived, no charge" . Possible recurrence of cellulitis. See above for findings. Inbox message sent to [facility] and picture taken to add to pts chart. She was advised to hold off on self MLD until having a doctor appt and being seen. If deemed to be cellulitis, also advised her no self MLD for next few days until redness begins to resolve. Was also encouraged to be mindful of symptoms as she may also ned to R/S her [employer] demo. Pt able to verbalize good understanding of all    OBJECTIVE  IMPAIRMENTS increased edema, postural dysfunction, and pain.    ACTIVITY LIMITATIONS  None .    PERSONAL FACTORS No personal factors are also affecting patient's functional outcome.      REHAB POTENTIAL: Good   CLINICAL DECISION MAKING: Stable/uncomplicated   EVALUATION COMPLEXITY: Low   GOALS: Goals reviewed with patient? Yes   SHORT TERM GOALS: Target date: 03/09/2022   Pt will obtain a compression bra and swell spot for long term management of lymphedema. Baseline: Goal status: INITIAL     LONG TERM GOALS: Target date: 03/23/2022   Pt will be independent in self MLD for long term management of lymphedema. Baseline:  Goal status: INITIAL   2.  Pt will receive trial of Flex[employer]Touch compression pump for long term management of lymphedema. Baseline:  Goal status: INITIAL   3.  Pt will report a 50% improvement in swelling and fibrosis in R breast to improve comfort.  Baseline:  Goal status: INITIAL     PLAN: PT FREQUENCY: 2x/week   PT DURATION: 4 weeks   PLANNED INTERVENTIONS: Therapeutic exercises, Patient/Family education, Manual lymph drainage, Compression bandaging, scar mobilization, Taping, Vasopneumatic device, and Manual therapy   PLAN FOR NEXT SESSION: Renewal next. Did pt have cellulitis? Cont MLD to R breast, did she have [employer] demo yet (scheduled for 6/2 but was getting over possible cellulitis)   Otelia Limes, PTA 03/28/2022, 10:41 AM

## 2022-03-29 ENCOUNTER — Telehealth: Payer: Self-pay | Admitting: *Deleted

## 2022-03-29 NOTE — Telephone Encounter (Signed)
Received call from pt with complaint of right breast pain, redness, and slight warmth x1 day. Pt denies recent injury, trauma, or fever at this time.  Pt states she has a hx of cellulitis in right breast and requesting [facility] visit for further evaluation and tx.  Appt scheduled, pt verbalized understanding.

## 2022-03-30 ENCOUNTER — Other Ambulatory Visit: Payer: Self-pay

## 2022-03-30 ENCOUNTER — Telehealth: Payer: Self-pay

## 2022-03-30 ENCOUNTER — Inpatient Hospital Stay: Payer: Medicare Other | Attending: Radiation Oncology | Admitting: Adult Health

## 2022-03-30 VITALS — BP 135/83 | HR 77 | Temp 97.8°F | Resp 16 | Ht 63.0 in | Wt 187.2 lb

## 2022-03-30 DIAGNOSIS — C50411 Malignant neoplasm of upper-outer quadrant of right female breast: Secondary | ICD-10-CM | POA: Diagnosis not present

## 2022-03-30 DIAGNOSIS — Z8041 Family history of malignant neoplasm of ovary: Secondary | ICD-10-CM | POA: Diagnosis not present

## 2022-03-30 DIAGNOSIS — Z8042 Family history of malignant neoplasm of prostate: Secondary | ICD-10-CM | POA: Insufficient documentation

## 2022-03-30 DIAGNOSIS — Z79899 Other long term (current) drug therapy: Secondary | ICD-10-CM | POA: Diagnosis not present

## 2022-03-30 DIAGNOSIS — Z801 Family history of malignant neoplasm of trachea, bronchus and lung: Secondary | ICD-10-CM | POA: Insufficient documentation

## 2022-03-30 DIAGNOSIS — Z833 Family history of diabetes mellitus: Secondary | ICD-10-CM | POA: Insufficient documentation

## 2022-03-30 DIAGNOSIS — Z8719 Personal history of other diseases of the digestive system: Secondary | ICD-10-CM | POA: Insufficient documentation

## 2022-03-30 DIAGNOSIS — Z832 Family history of diseases of the blood and blood-forming organs and certain disorders involving the immune mechanism: Secondary | ICD-10-CM | POA: Diagnosis not present

## 2022-03-30 DIAGNOSIS — Z823 Family history of stroke: Secondary | ICD-10-CM | POA: Insufficient documentation

## 2022-03-30 DIAGNOSIS — Z806 Family history of leukemia: Secondary | ICD-10-CM | POA: Insufficient documentation

## 2022-03-30 DIAGNOSIS — Z803 Family history of malignant neoplasm of breast: Secondary | ICD-10-CM | POA: Diagnosis not present

## 2022-03-30 DIAGNOSIS — Z8249 Family history of ischemic heart disease and other diseases of the circulatory system: Secondary | ICD-10-CM | POA: Diagnosis not present

## 2022-03-30 DIAGNOSIS — Z170 Estrogen receptor positive status [ER+]: Secondary | ICD-10-CM

## 2022-03-30 MED ORDER — AMOXICILLIN-POT CLAVULANATE 875-125 MG PO TABS: 1.0000 | ORAL_TABLET | Freq: Two times a day (BID) | ORAL | 0 refills | Status: DC

## 2022-03-30 NOTE — Telephone Encounter (Signed)
Spoke with pt husband, informed him of upcoming appointment date/time with Dr Brantley Stage, 6/5 '@10'$ :58 and to call me back with any questions.  Husband verbalized understanding and thanks

## 2022-04-02 ENCOUNTER — Ambulatory Visit: Payer: Self-pay | Admitting: Surgery

## 2022-04-02 ENCOUNTER — Ambulatory Visit: Payer: Medicare Other | Admitting: Physical Therapy

## 2022-04-02 DIAGNOSIS — N61 Mastitis without abscess: Secondary | ICD-10-CM | POA: Diagnosis not present

## 2022-04-05 ENCOUNTER — Encounter: Payer: Medicare Other | Admitting: Physical Therapy

## 2022-04-07 NOTE — Assessment & Plan Note (Signed)
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  Nichole Ritter's breast appears acutely infected.  I prescribed augmentin for her to take.  She will let me know how it improves.  We can certainly coordinate f/u with Dr. Brantley Stage as well for f/u and evaluation of her breast.

## 2022-04-07 NOTE — Progress Notes (Signed)
[facility] Follow up:    Nichole Stains, MD [address] [place] [address]   DIAGNOSIS:  Cancer Staging  Malignant neoplasm of upper-outer quadrant of right female breast [facility]) Staging form: Breast, AJCC 8th Edition - Clinical stage from 05/10/2021: cT1b, cN0, cM0, G1, ER+, PR+, HER2: Unknown - Signed by Nicholas Lose, MD on 05/10/2021 Stage prefix: Initial diagnosis Histologic grading system: 3 grade system Laterality: Right Staged by: Pathologist and managing physician Stage used in treatment planning: Yes National guidelines used in treatment planning: Yes Type of national guideline used in treatment planning: NCCN - Pathologic stage from 07/18/2021: No Stage Recommended (pT1c, cN0, cM0, G2, ER+, PR+, HER2-, Oncotype DX score: 19) - Unsigned Method of lymph node assessment: Clinical Multigene prognostic tests performed: Oncotype DX Recurrence score range: Greater than or equal to 11 Histologic grading system: 3 grade system   SUMMARY OF ONCOLOGIC HISTORY: Oncology History  Malignant neoplasm of upper-outer quadrant of right female breast ([place])  04/24/2021 Initial Diagnosis   Screening mammogram detected architectural distortion 9:00 measuring 0.9 cm biopsy revealed invasive mammary cancer with lobular features prognostic panel pending, right breast calcifications at 12:00 1.2 cm, biopsy showed DCIS with LCIS high-grade with necrosis and calcifications ER greater than 95%, PR 40%   05/10/2021 Cancer Staging   Staging form: Breast, AJCC 8th Edition - Clinical stage from 05/10/2021: cT1b, cN0, cM0, G1, ER+, PR+, HER2: Unknown - Signed by Nicholas Lose, MD on 05/10/2021 Stage prefix: Initial diagnosis Histologic grading system: 3 grade system Laterality: Right Staged by: Pathologist and managing physician Stage used in treatment planning: Yes National guidelines used in treatment planning: Yes Type of national guideline used in treatment  planning: NCCN   05/20/2021 Genetic Testing   Negative genetic testing:  No pathogenic variants detected on the [employer] BRCAplus panel (report date 05/20/2021) or the CancerNext-Expanded + RNAinsight panel (report date 05/22/2021). 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  The BRCAplus panel offered by [employer] and includes sequencing and deletion/duplication analysis for the following 8 genes: ATM, BRCA1, BRCA2, CDH1, CHEK2, PALB2, PTEN, and TP53. The CancerNext-Expanded + RNAinsight gene panel offered by [employer] and includes sequencing and rearrangement analysis for the following 77 genes: AIP, ALK, APC, ATM, AXIN2, BAP1, BARD1, BLM, BMPR1A, BRCA1, BRCA2, BRIP1, CDC73, CDH1, CDK4, CDKN1B, CDKN2A, CHEK2, CTNNA1, DICER1, FANCC, FH, FLCN, GALNT12, KIF1B, LZTR1, MAX, MEN1, MET, MLH1, MSH2, MSH3, MSH6, MUTYH, NBN, NF1, NF2, NTHL1, PALB2, PHOX2B, PMS2, POT1, PRKAR1A, PTCH1, PTEN, RAD51C, RAD51D, RB1, RECQL, RET, SDHA, SDHAF2, SDHB, SDHC, SDHD, SMAD4, SMARCA4, SMARCB1, SMARCE1, STK11, SUFU, TMEM127, TP53, TSC1, TSC2, VHL and XRCC2 (sequencing and deletion/duplication); EGFR, EGLN1, HOXB13, KIT, MITF, PDGFRA, POLD1 and POLE (sequencing only); EPCAM and GREM1 (deletion/duplication only). RNA data is routinely analyzed for use in variant interpretation for all genes.   06/15/2021 Definitive Surgery   FINAL MICROSCOPIC DIAGNOSIS:   A. BREAST, RIGHT SUPERIOR, LUMPECTOMY:  - Ductal carcinoma in situ, high-grade with necrosis and calcifications  - Lobular carcinoma in situ  - No evidence of invasive carcinoma  - Resection margins are negative for DCIS  - Biopsy site changes  - See oncology table   B. BREAST, RIGHT LATERAL, LUMPECTOMY:  - Invasive lobular carcinoma, 1.3 cm, grade 2, see comment  - Resection margins are negative for carcinoma  - Focal atypical ductal hyperplasia  - Atypical lobular hyperplasia  - Fibrocystic change with  calcifications  - Biopsy site changes  C. BREAST, RIGHT LATERAL ADDITIONAL ANTEROMEDIAL MARGIN, EXCISION:  - Benign breast parenchyma, negative for carcinoma   D. BREAST, RIGHT LATERAL ADDITIONAL INFERIOR/LATERAL MARGIN, EXCISION:  - Benign breast parenchyma, negative for carcinoma   E. BREAST, RIGHT LATERAL ADDITIONAL POSTERIOR/SUPERIOR MARGIN, EXCISION:  - Benign breast parenchyma, negative for carcinoma   F. LYMPH NODE, RIGHT AXILLARY, SENTINEL, EXCISION:  - Lymph node, negative for carcinoma (0/1), see comment   COMMENT:  B and F.   Immunohistochemical stain for E-cadherin is negative in the  tumor cells, consistent with a lobular phenotype.  Immunostain for  cytokeratin AE1/AE3 performed on part F is negative for metastatic  carcinoma.   ADDENDUM:  B5.  PROGNOSTIC INDICATOR RESULTS:  The tumor cells are NEGATIVE for Her2 (1+).  Estrogen Receptor: POSITIVE, 80% MODERATE STAINING INTENSITY  Progesterone Receptor: POSITIVE, 60% MODERATE STAINING INTENSITY  Proliferation Marker Ki-67: 1%    06/30/2021 Oncotype testing   Oncotype DX: Recurrence score: 19, distant recurrence at 9 years: 6%   07/26/2021 - 08/21/2021 Radiation Therapy   Adjuvant radiation   09/12/2021 -  Anti-estrogen oral therapy   Adjuvant letrozole     CURRENT THERAPY: letrozole  INTERVAL HISTORY: Nichole Ritter 71 y.o. female returns for follow up of recurrent breast erythema, swelling and warmnth.  This last happened a few months ago and resolved with antibiotics.  This has recurred and she wanted me to examine her and evaluate whether she needs more antibiotics.       Patient Active Problem List   Diagnosis Date Noted   Genetic testing 05/20/2021   Family history of prostate cancer 05/10/2021   Family history of breast cancer 05/10/2021   Family history of ovarian cancer 05/10/2021   Family history of leukemia 05/10/2021   Family history of lung cancer 05/10/2021   Malignant neoplasm of upper-outer  quadrant of right female breast ([place]) 05/05/2021   Aortic atherosclerosis ([place]) 01/06/2019   Mixed hyperlipidemia 01/06/2019   Family history of arteriosclerotic cardiovascular disease 01/06/2019    is allergic to caine-1 [lidocaine], esomeprazole magnesium, formaldehyde, and nickel.  MEDICAL HISTORY: Past Medical History:  Diagnosis Date   Allergic rhinitis    Aortic atherosclerosis (HCC)    Arthritis    Barrett esophagus    Barrett's esophagus    Bunion    Cancer (HCC)    right breast [facility]   Family history of breast cancer    Family history of leukemia    Family history of lung cancer    Family history of ovarian cancer    Family history of prostate cancer    Female cystocele    GERD (gastroesophageal reflux disease)    History of epistaxis 2012   Hyperlipidemia    UNSPECIFIED   Lichen sclerosus    Obesity    Osteopenia    Personal history of colonic polyps    Rectocele    Situational stress    Stress incontinence    Vitamin D deficiency     SURGICAL HISTORY: Past Surgical History:  Procedure Laterality Date   BREAST LUMPECTOMY     BREAST LUMPECTOMY WITH RADIOACTIVE SEED AND SENTINEL LYMPH NODE BIOPSY Right 06/15/2021   Procedure: RIGHT BREAST LUMPECTOMY WITH RADIOACTIVE SEED X 2 AND SENTINEL LYMPH NODE BIOPSY;  Surgeon: Erroll Luna, MD;  Location: [place];  Service: General;  Laterality: Right;   CATARACT EXTRACTION Bilateral 02/2018   03/2018   COLONOSCOPY  01/2013   LUMBAR DISC SURGERY     TUBAL LIGATION  SOCIAL HISTORY: Social History   Socioeconomic History   Marital status: Married    Spouse name: Not on file   Number of children: Not on file   Years of education: Not on file   Highest education level: Not on file  Occupational History   Not on file  Tobacco Use   Smoking status: Never   Smokeless tobacco: Never  Vaping Use   Vaping Use: Never used  Substance and Sexual Activity   Alcohol use: Yes    Comment: rare    Drug use: Never   Sexual activity: Not Currently    Birth control/protection: Surgical  Other Topics Concern   Not on file  Social History Narrative   Not on file   Social Determinants of Health   Financial Resource Strain: Low Risk  (11/20/2021)   Overall Financial Resource Strain (CARDIA)    Difficulty of Paying Living Expenses: Not hard at all  Food Insecurity: No Food Insecurity (11/20/2021)   Hunger Vital Sign    Worried About Running Out of Food in the Last Year: Never true    [place] in the Last Year: Never true  Transportation Needs: No Transportation Needs (05/10/2021)   PRAPARE - Hydrologist (Medical): No    Lack of Transportation (Non-Medical): No  Physical Activity: Insufficiently Active (11/20/2021)   Exercise Vital Sign    Days of Exercise per Week: 3 days    Minutes of Exercise per Session: 30 min  Stress: No Stress Concern Present (11/20/2021)   [place]    Feeling of Stress : Not at all  Social Connections: Moderately Isolated (11/20/2021)   Social Connection and Isolation Panel [NHANES]    Frequency of Communication with Friends and Family: Three times a week    Frequency of Social Gatherings with Friends and Family: Twice a week    Attends Religious Services: Never    Marine scientist or Organizations: No    Attends Archivist Meetings: Never    Marital Status: Married  Human resources officer Violence: Not At Risk (11/20/2021)   Humiliation, Afraid, Rape, and Kick questionnaire    Fear of Current or Ex-Partner: No    Emotionally Abused: No    Physically Abused: No    Sexually Abused: No    FAMILY HISTORY: Family History  Problem Relation Age of Onset   Diabetes Mother    Hypertension Mother    Breast cancer Mother 51   Stroke Mother    Polycythemia Father        dx 89s   Lung cancer Sister 37       hx smoking   Leukemia Brother 35        acute   Prostate cancer Brother 63   Cancer Maternal Aunt        unknown type, dx >50   Heart attack Paternal Uncle    Cancer Cousin        multiple maternal cousins w/cancer - unknown types   Breast cancer Niece        dx 67s, metastatic   Ovarian cancer Niece        dx 30s/40s   Prostate cancer Nephew        dx 40s/50s    Review of Systems  Constitutional:  Negative for appetite change, chills, fatigue, fever and unexpected weight change.  HENT:   Negative for hearing loss, lump/mass and trouble swallowing.  Eyes:  Negative for eye problems and icterus.  Respiratory:  Negative for chest tightness, cough and shortness of breath.   Cardiovascular:  Negative for chest pain, leg swelling and palpitations.  Gastrointestinal:  Negative for abdominal distention, abdominal pain, constipation, diarrhea, nausea and vomiting.  Endocrine: Negative for hot flashes.  Genitourinary:  Negative for difficulty urinating.   Musculoskeletal:  Negative for arthralgias.  Skin:  Negative for itching and rash.  Neurological:  Negative for dizziness, extremity weakness, headaches and numbness.  Hematological:  Negative for adenopathy. Does not bruise/bleed easily.  Psychiatric/Behavioral:  Negative for depression. The patient is not nervous/anxious.       PHYSICAL EXAMINATION  ECOG PERFORMANCE STATUS: 1 - Symptomatic but completely ambulatory  Vitals:   03/30/22 1108  BP: 135/83  Pulse: 77  Resp: 16  Temp: 97.8 F (36.6 C)  SpO2: 97%    Physical Exam Constitutional:      General: She is not in acute distress.    Appearance: Normal appearance. She is not toxic-appearing.  HENT:     Head: Normocephalic and atraumatic.  Eyes:     General: No scleral icterus. Cardiovascular:     Rate and Rhythm: Normal rate and regular rhythm.     Pulses: Normal pulses.     Heart sounds: Normal heart sounds.  Pulmonary:     Effort: Pulmonary effort is normal.     Breath sounds: Normal breath sounds.   Chest:     Comments: Right breast s/p lumpectomy and radiation.  There is erythema, swelling, and warmth to the breast around the lumpectomy site extending into the breast.  She has mild tenderness.   Abdominal:     General: Abdomen is flat. Bowel sounds are normal. There is no distension.     Palpations: Abdomen is soft.     Tenderness: There is no abdominal tenderness.  Musculoskeletal:        General: No swelling.     Cervical back: Neck supple.  Lymphadenopathy:     Cervical: No cervical adenopathy.  Skin:    General: Skin is warm and dry.     Findings: No rash.  Neurological:     General: No focal deficit present.     Mental Status: She is alert.  Psychiatric:        Mood and Affect: Mood normal.        Behavior: Behavior normal.     LABORATORY DATA: None for this visit    ASSESSMENT and THERAPY PLAN:   Malignant neoplasm of upper-outer quadrant of right female breast ([place]) 06/15/2021: Right superior lumpectomy: High-grade DCIS with necrosis, LCIS, margins negative Right lateral lumpectomy: Grade 2 invasive lobular carcinoma, 1.3 cm, margins negative, focal ADH, ALH, 0/1 lymph node negative, ER 80%, PR 60%, Ki-67 1%, HER2 negative 1+ Oncotype DX score: 19: Distant recurrence rate at 9 years: 6% Adjuvant radiation: Completed 08/21/2021  Treatment plan: Unable to tolerate letrozole, has opted to forego antiestrogen therapy.  Nichole Ritter's breast appears acutely infected.  I prescribed augmentin for her to take.  She will let me know how it improves.  We can certainly coordinate f/u with Dr. Brantley Stage as well for f/u and evaluation of her breast.     All questions were answered. The patient knows to call the clinic with any problems, questions or concerns. We can certainly see the patient much sooner if necessary.  Total encounter time:20 minutes*in face-to-face visit time, chart review, lab review, care coordination, order entry, and documentation of the encounter  time.  Wilber Bihari, NP 04/07/22 7:16 AM Medical Oncology and Hematology [facility] [place], [place] [address] Tel. [phone]    Fax. [phone]  *Total Encounter Time as defined by the Centers for Medicare and Medicaid Services includes, in addition to the face-to-face time of a patient visit (documented in the note above) non-face-to-face time: obtaining and reviewing outside history, ordering and reviewing medications, tests or procedures, care coordination (communications with other health care professionals or caregivers) and documentation in the medical record.

## 2022-04-09 DIAGNOSIS — M3501 Sicca syndrome with keratoconjunctivitis: Secondary | ICD-10-CM | POA: Diagnosis not present

## 2022-04-10 ENCOUNTER — Encounter: Payer: Medicare Other | Admitting: Physical Therapy

## 2022-04-18 ENCOUNTER — Ambulatory Visit: Payer: Medicare Other | Admitting: Rehabilitation

## 2022-04-18 ENCOUNTER — Telehealth: Payer: Self-pay | Admitting: Rehabilitation

## 2022-04-18 ENCOUNTER — Encounter: Payer: Self-pay | Admitting: Rehabilitation

## 2022-04-18 NOTE — Telephone Encounter (Signed)
Called pt regarding today's PT appointment: noted upcoming surgery for seroma removal and chronic infection so we advised that she did not need to do breast MLD at this time. Pt agreeable.

## 2022-04-20 ENCOUNTER — Ambulatory Visit: Payer: Medicare Other | Admitting: Rehabilitation

## 2022-04-23 ENCOUNTER — Encounter: Payer: Medicare Other | Admitting: Physical Therapy

## 2022-04-24 ENCOUNTER — Ambulatory Visit: Payer: Medicare Other

## 2022-04-25 ENCOUNTER — Encounter: Payer: Medicare Other | Admitting: Physical Therapy

## 2022-04-26 ENCOUNTER — Ambulatory Visit: Payer: Medicare Other

## 2022-04-26 ENCOUNTER — Encounter: Payer: Medicare Other | Admitting: Physical Therapy

## 2022-04-27 ENCOUNTER — Ambulatory Visit: Payer: Self-pay | Admitting: Surgery

## 2022-04-27 DIAGNOSIS — N61 Mastitis without abscess: Secondary | ICD-10-CM | POA: Diagnosis not present

## 2022-05-03 ENCOUNTER — Encounter: Payer: Medicare Other | Admitting: Physical Therapy

## 2022-05-14 ENCOUNTER — Ambulatory Visit: Payer: Medicare Other | Attending: Adult Health

## 2022-05-14 VITALS — Wt 189.5 lb

## 2022-05-14 DIAGNOSIS — Z483 Aftercare following surgery for neoplasm: Secondary | ICD-10-CM | POA: Insufficient documentation

## 2022-05-14 NOTE — Therapy (Addendum)
OUTPATIENT PHYSICAL THERAPY SOZO SCREENING NOTE   Patient Name: Nichole Ritter MRN: [id number] DOB:16-Aug-1951, 71 y.o., female Today's Date: 05/14/2022  PCP: [name], MD REFERRING PROVIDER: [name], MD   PT End of Session - 05/14/22 9242     Visit Number 8   # unchanged due to screen only   PT Start Time 0919    PT Stop Time 0927    PT Time Calculation (min) 8 min    Activity Tolerance Patient tolerated treatment well    Behavior During Therapy WFL for tasks assessed/performed             Past Medical History:  Diagnosis Date   Allergic rhinitis    Aortic atherosclerosis ([place])    Arthritis    Barrett esophagus    Barrett's esophagus    Bunion    Cancer ([place])    right breast [facility]   Family history of breast cancer    Family history of leukemia    Family history of lung cancer    Family history of ovarian cancer    Family history of prostate cancer    Female cystocele    GERD (gastroesophageal reflux disease)    History of epistaxis 2012   Hyperlipidemia    UNSPECIFIED   Lichen sclerosus    Obesity    Osteopenia    Personal history of colonic polyps    Rectocele    Situational stress    Stress incontinence    Vitamin D deficiency    Past Surgical History:  Procedure Laterality Date   BREAST LUMPECTOMY     BREAST LUMPECTOMY WITH RADIOACTIVE SEED AND SENTINEL LYMPH NODE BIOPSY Right 06/15/2021   Procedure: RIGHT BREAST LUMPECTOMY WITH RADIOACTIVE SEED X 2 AND SENTINEL LYMPH NODE BIOPSY;  Surgeon: Erroll Luna, MD;  Location: [facility];  Service: General;  Laterality: Right;   CATARACT EXTRACTION Bilateral 02/2018   03/2018   COLONOSCOPY  01/2013   LUMBAR [place] SURGERY     TUBAL LIGATION     Patient Active Problem List   Diagnosis Date Noted   Genetic testing 05/20/2021   Family history of prostate cancer 05/10/2021   Family history of breast cancer 05/10/2021   Family history of ovarian cancer 05/10/2021   Family history of  leukemia 05/10/2021   Family history of lung cancer 05/10/2021   Malignant neoplasm of upper-outer quadrant of right female breast ([place]) 05/05/2021   Aortic atherosclerosis ([place]) 01/06/2019   Mixed hyperlipidemia 01/06/2019   Family history of arteriosclerotic cardiovascular disease 01/06/2019    REFERRING DIAG: right breast cancer at risk for lymphedema  THERAPY DIAG: Aftercare following surgery for neoplasm  PERTINENT HISTORY: Patient was diagnosed on 02/21/2021 with right invasive lobular carcinoma breast cancer. She underwent a right lumpectomy and sentinel node biopsy (1 negative node) on 06/15/2021. 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 "I'm going to go ahead and have the hematoma removed next week because it keeps getting infected."  PAIN:  Are you having pain? No  SOZO SCREENING: Patient was assessed today using the SOZO machine to determine the lymphedema index score. This was compared to her baseline score. It was determined that she is within the recommended range when compared to her baseline and no further action is needed at this time. She will continue SOZO screenings. These are done every 3 months for 2 years  post operatively followed by every 6 months for 2 years, and then annually.   L-DEX FLOWSHEETS - 05/14/22 0900       L-DEX LYMPHEDEMA SCREENING   Measurement Type Unilateral    L-DEX MEASUREMENT EXTREMITY Upper Extremity    POSITION  Standing    DOMINANT SIDE Right    At Risk Side Right    BASELINE SCORE (UNILATERAL) 1.7    L-DEX SCORE (UNILATERAL) 1.2    VALUE CHANGE (UNILAT) -0.5              Otelia Limes, PTA 05/14/2022, 9:28 AM

## 2022-05-17 ENCOUNTER — Other Ambulatory Visit: Payer: Self-pay

## 2022-05-17 ENCOUNTER — Encounter (HOSPITAL_BASED_OUTPATIENT_CLINIC_OR_DEPARTMENT_OTHER): Payer: Self-pay | Admitting: Surgery

## 2022-05-18 NOTE — Progress Notes (Signed)

## 2022-05-21 ENCOUNTER — Ambulatory Visit: Payer: Medicare Other | Admitting: Hematology and Oncology

## 2022-05-24 ENCOUNTER — Ambulatory Visit (HOSPITAL_BASED_OUTPATIENT_CLINIC_OR_DEPARTMENT_OTHER)
Admission: RE | Admit: 2022-05-24 | Discharge: 2022-05-24 | Disposition: A | Discharge status: Home / Self Care | Payer: Medicare Other | Attending: Surgery | Admitting: Surgery

## 2022-05-24 ENCOUNTER — Encounter (HOSPITAL_BASED_OUTPATIENT_CLINIC_OR_DEPARTMENT_OTHER)
Admission: RE | Disposition: A | Discharge status: Home / Self Care | Payer: Self-pay | Source: Home / Self Care | Attending: Surgery

## 2022-05-24 ENCOUNTER — Encounter (HOSPITAL_BASED_OUTPATIENT_CLINIC_OR_DEPARTMENT_OTHER): Payer: Self-pay | Admitting: Surgery

## 2022-05-24 ENCOUNTER — Ambulatory Visit (HOSPITAL_BASED_OUTPATIENT_CLINIC_OR_DEPARTMENT_OTHER): Payer: Medicare Other | Admitting: Certified Registered Nurse Anesthetist

## 2022-05-24 ENCOUNTER — Other Ambulatory Visit: Payer: Self-pay

## 2022-05-24 DIAGNOSIS — Z01818 Encounter for other preprocedural examination: Secondary | ICD-10-CM

## 2022-05-24 DIAGNOSIS — Y849 Medical procedure, unspecified as the cause of abnormal reaction of the patient, or of later complication, without mention of misadventure at the time of the procedure: Secondary | ICD-10-CM | POA: Insufficient documentation

## 2022-05-24 DIAGNOSIS — N61 Mastitis without abscess: Secondary | ICD-10-CM | POA: Diagnosis not present

## 2022-05-24 DIAGNOSIS — K219 Gastro-esophageal reflux disease without esophagitis: Secondary | ICD-10-CM | POA: Diagnosis not present

## 2022-05-24 DIAGNOSIS — Y781 Therapeutic (nonsurgical) and rehabilitative radiological devices associated with adverse incidents: Secondary | ICD-10-CM | POA: Insufficient documentation

## 2022-05-24 DIAGNOSIS — L7634 Postprocedural seroma of skin and subcutaneous tissue following other procedure: Secondary | ICD-10-CM | POA: Diagnosis not present

## 2022-05-24 HISTORY — PX: EVACUATION BREAST HEMATOMA: SHX1537

## 2022-05-24 SURGERY — EVACUATION, HEMATOMA, BREAST
Anesthesia: General | Site: Breast | Laterality: Right

## 2022-05-24 MED ORDER — CEFAZOLIN IN SODIUM CHLORIDE 3-0.9 GM/100ML-% IV SOLN: 3.0000 g | INTRAVENOUS | Status: DC

## 2022-05-24 MED ORDER — OXYCODONE HCL 5 MG/5ML PO SOLN: 5.0000 mg | Freq: Once | ORAL | Status: AC | PRN

## 2022-05-24 MED ORDER — LIDOCAINE 2% (20 MG/ML) 5 ML SYRINGE
INTRAMUSCULAR | Status: AC
  Filled 2022-05-24: qty 5

## 2022-05-24 MED ORDER — ACETAMINOPHEN 500 MG PO TABS
1000.0000 mg | ORAL_TABLET | ORAL | Status: AC
  Administered 2022-05-24: 1000 mg via ORAL

## 2022-05-24 MED ORDER — ONDANSETRON HCL 4 MG/2ML IJ SOLN
INTRAMUSCULAR | Status: AC
Start: 2022-05-24 — End: ?
  Filled 2022-05-24: qty 2

## 2022-05-24 MED ORDER — OXYCODONE HCL 5 MG PO TABS
ORAL_TABLET | ORAL | Status: AC
  Filled 2022-05-24: qty 1

## 2022-05-24 MED ORDER — CEFAZOLIN SODIUM-DEXTROSE 2-4 GM/100ML-% IV SOLN
INTRAVENOUS | Status: AC
  Filled 2022-05-24: qty 100

## 2022-05-24 MED ORDER — CHLORHEXIDINE GLUCONATE CLOTH 2 % EX PADS: 6.0000 | MEDICATED_PAD | Freq: Once | CUTANEOUS | Status: DC

## 2022-05-24 MED ORDER — OXYCODONE HCL 5 MG PO TABS: 5.0000 mg | ORAL_TABLET | Freq: Four times a day (QID) | ORAL | 0 refills | Status: DC | PRN

## 2022-05-24 MED ORDER — FENTANYL CITRATE (PF) 100 MCG/2ML IJ SOLN
INTRAMUSCULAR | Status: DC | PRN
  Administered 2022-05-24: 50 ug via INTRAVENOUS
  Administered 2022-05-24: 25 ug via INTRAVENOUS

## 2022-05-24 MED ORDER — PROPOFOL 10 MG/ML IV BOLUS
INTRAVENOUS | Status: DC | PRN
  Administered 2022-05-24: 150 mg via INTRAVENOUS

## 2022-05-24 MED ORDER — ONDANSETRON HCL 4 MG/2ML IJ SOLN
INTRAMUSCULAR | Status: DC | PRN
  Administered 2022-05-24: 4 mg via INTRAVENOUS

## 2022-05-24 MED ORDER — IBUPROFEN 800 MG PO TABS: 800.0000 mg | ORAL_TABLET | Freq: Three times a day (TID) | ORAL | 0 refills | Status: DC | PRN

## 2022-05-24 MED ORDER — FENTANYL CITRATE (PF) 100 MCG/2ML IJ SOLN
25.0000 ug | INTRAMUSCULAR | Status: DC | PRN
  Administered 2022-05-24 (×2): 50 ug via INTRAVENOUS

## 2022-05-24 MED ORDER — ONDANSETRON HCL 4 MG/2ML IJ SOLN: 4.0000 mg | Freq: Four times a day (QID) | INTRAMUSCULAR | Status: DC | PRN

## 2022-05-24 MED ORDER — BUPIVACAINE-EPINEPHRINE 0.5% -1:200000 IJ SOLN
INTRAMUSCULAR | Status: DC | PRN
  Administered 2022-05-24: 10 mL

## 2022-05-24 MED ORDER — ACETAMINOPHEN 500 MG PO TABS
ORAL_TABLET | ORAL | Status: AC
  Filled 2022-05-24: qty 2

## 2022-05-24 MED ORDER — KETOROLAC TROMETHAMINE 30 MG/ML IJ SOLN
INTRAMUSCULAR | Status: DC | PRN
  Administered 2022-05-24: 15 mg via INTRAVENOUS

## 2022-05-24 MED ORDER — PROPOFOL 10 MG/ML IV BOLUS
INTRAVENOUS | Status: AC
  Filled 2022-05-24: qty 20

## 2022-05-24 MED ORDER — SODIUM CHLORIDE 0.9 % IV SOLN
INTRAVENOUS | Status: AC
  Filled 2022-05-24: qty 10

## 2022-05-24 MED ORDER — DEXAMETHASONE SODIUM PHOSPHATE 10 MG/ML IJ SOLN
INTRAMUSCULAR | Status: AC
  Filled 2022-05-24: qty 1

## 2022-05-24 MED ORDER — FENTANYL CITRATE (PF) 100 MCG/2ML IJ SOLN
INTRAMUSCULAR | Status: AC
  Filled 2022-05-24: qty 2

## 2022-05-24 MED ORDER — CEFAZOLIN SODIUM-DEXTROSE 2-4 GM/100ML-% IV SOLN
2.0000 g | INTRAVENOUS | Status: AC
  Administered 2022-05-24: 2 g via INTRAVENOUS

## 2022-05-24 MED ORDER — LACTATED RINGERS IV SOLN: INTRAVENOUS | Status: DC

## 2022-05-24 MED ORDER — OXYCODONE HCL 5 MG PO TABS
5.0000 mg | ORAL_TABLET | Freq: Once | ORAL | Status: AC | PRN
  Administered 2022-05-24: 5 mg via ORAL

## 2022-05-24 MED ORDER — KETOROLAC TROMETHAMINE 30 MG/ML IJ SOLN
INTRAMUSCULAR | Status: AC
Start: 2022-05-24 — End: ?
  Filled 2022-05-24: qty 1

## 2022-05-24 MED ORDER — DEXAMETHASONE SODIUM PHOSPHATE 4 MG/ML IJ SOLN
INTRAMUSCULAR | Status: DC | PRN
  Administered 2022-05-24: 5 mg via INTRAVENOUS

## 2022-05-24 SURGICAL SUPPLY — 40 items
APL SKNCLS STERI-STRIP NONHPOA (GAUZE/BANDAGES/DRESSINGS)
BENZOIN TINCTURE PRP APPL 2/3 (GAUZE/BANDAGES/DRESSINGS) IMPLANT
BINDER BREAST XLRG (GAUZE/BANDAGES/DRESSINGS) ×1 IMPLANT
BLADE HEX COATED 2.75 (ELECTRODE) ×2 IMPLANT
BLADE SURG 10 STRL SS (BLADE) IMPLANT
BLADE SURG 15 STRL LF DISP TIS (BLADE) ×2 IMPLANT
BLADE SURG 15 STRL SS (BLADE) ×2
CANISTER SUCT 1200ML W/VALVE (MISCELLANEOUS) IMPLANT
COVER BACK TABLE 60X90IN (DRAPES) ×3 IMPLANT
COVER MAYO STAND STRL (DRAPES) ×3 IMPLANT
DRAIN CHANNEL 15F RND FF W/TCR (WOUND CARE) IMPLANT
DRAIN CHANNEL 19F RND (DRAIN) ×1 IMPLANT
DRAPE LAPAROTOMY 100X72 PEDS (DRAPES) ×3 IMPLANT
ELECT REM PT RETURN 9FT ADLT (ELECTROSURGICAL) ×2
ELECTRODE REM PT RTRN 9FT ADLT (ELECTROSURGICAL) ×2 IMPLANT
EVACUATOR SILICONE 100CC (DRAIN) ×1 IMPLANT
GLOVE BIOGEL PI IND STRL 8 (GLOVE) ×2 IMPLANT
GLOVE BIOGEL PI INDICATOR 8 (GLOVE) ×1
GLOVE ECLIPSE 8.0 STRL XLNG CF (GLOVE) ×3 IMPLANT
GOWN STRL REUS W/ TWL LRG LVL3 (GOWN DISPOSABLE) ×4 IMPLANT
GOWN STRL REUS W/ TWL XL LVL3 (GOWN DISPOSABLE) ×2 IMPLANT
GOWN STRL REUS W/TWL LRG LVL3 (GOWN DISPOSABLE) ×4
GOWN STRL REUS W/TWL XL LVL3 (GOWN DISPOSABLE) ×2
NDL HYPO 25X1 1.5 SAFETY (NEEDLE) ×2 IMPLANT
NEEDLE HYPO 25X1 1.5 SAFETY (NEEDLE) ×2 IMPLANT
NS IRRIG 1000ML POUR BTL (IV SOLUTION) IMPLANT
PACK BASIN DAY SURGERY FS (CUSTOM PROCEDURE TRAY) ×3 IMPLANT
PENCIL SMOKE EVACUATOR (MISCELLANEOUS) ×3 IMPLANT
SPONGE T-LAP 4X18 ~~LOC~~+RFID (SPONGE) IMPLANT
STRIP CLOSURE SKIN 1/2X4 (GAUZE/BANDAGES/DRESSINGS) IMPLANT
SUT ETHILON 2 0 FS 18 (SUTURE) ×1 IMPLANT
SUT ETHILON 3 0 PS 1 (SUTURE) IMPLANT
SUT MON AB 4-0 PC3 18 (SUTURE) ×3 IMPLANT
SUT VICRYL 3-0 CR8 SH (SUTURE) ×3 IMPLANT
SUT VICRYL AB 3 0 TIES (SUTURE) IMPLANT
SYR CONTROL 10ML LL (SYRINGE) ×3 IMPLANT
TOWEL GREEN STERILE FF (TOWEL DISPOSABLE) ×5 IMPLANT
TRAY DSU PREP LF (CUSTOM PROCEDURE TRAY) ×2 IMPLANT
TUBE CONNECTING 20X1/4 (TUBING) IMPLANT
YANKAUER SUCT BULB TIP NO VENT (SUCTIONS) IMPLANT

## 2022-05-24 NOTE — Anesthesia Preprocedure Evaluation (Signed)
Anesthesia Evaluation  Patient identified by MRN, date of birth, ID band Patient awake    Reviewed: Allergy & Precautions, H&P , NPO status , Patient's Chart, lab work & pertinent test results  Airway Mallampati: II   Neck ROM: full    Dental   Pulmonary neg pulmonary ROS,    breath sounds clear to auscultation       Cardiovascular negative cardio ROS   Rhythm:regular Rate:Normal     Neuro/Psych    GI/Hepatic GERD  ,  Endo/Other    Renal/GU      Musculoskeletal  (+) Arthritis ,   Abdominal   Peds  Hematology   Anesthesia Other Findings   Reproductive/Obstetrics H/o breast CA                             Anesthesia Physical Anesthesia Plan  ASA: 2  Anesthesia Plan: General   Post-op Pain Management:    Induction: Intravenous  PONV Risk Score and Plan: 3 and Ondansetron, Dexamethasone and Treatment may vary due to age or medical condition  Airway Management Planned: LMA  Additional Equipment:   Intra-op Plan:   Post-operative Plan: Extubation in OR  Informed Consent: I have reviewed the patients History and Physical, chart, labs and discussed the procedure including the risks, benefits and alternatives for the proposed anesthesia with the patient or authorized representative who has indicated his/her understanding and acceptance.     Dental advisory given  Plan Discussed with: CRNA, Anesthesiologist and Surgeon  Anesthesia Plan Comments:         Anesthesia Quick Evaluation

## 2022-05-24 NOTE — H&P (Signed)
History of Present Illness: Nichole Ritter is a 71 y.o. female who is seen today for recheck of her right breast. 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 She is scheduled for drainage of a chronic seroma here in about 4 weeks. She had some concerns about the incision when to come in today to have it checked. She had no fever or chills. She is noted some minimal redness just over the incision. She has been off antibiotics..    Review of Systems: A complete review of systems was obtained from the patient. I have reviewed this information and discussed as appropriate with the patient. See HPI as well for other ROS.    Medical History: Past Medical History:  Diagnosis Date  Arthritis  GERD (gastroesophageal reflux disease)  History of cancer  Hyperlipidemia   There is no problem list on file for this patient.  Past Surgical History:  Procedure Laterality Date  discectomy surgery N/A  LAPAROSCOPIC TUBAL LIGATION  MASTECTOMY    Allergies  Allergen Reactions  Lidocaine Other (See Comments) and Rash  skin irritation Skin irritation Skin irritation  Esomeprazole Magnesium Diarrhea  Formaldehyde Rash  Nickel Rash   Current Outpatient Medications on File Prior to Visit  Medication Sig Dispense Refill  amoxicillin-clavulanate (AUGMENTIN) 875-125 mg tablet Take 1 tablet by mouth 2 (two) times daily  atorvastatin (LIPITOR) 10 MG tablet Take 10 mg by mouth once daily  betamethasone dipropionate (DIPROSONE) 3.54 % cream 1 APPLICATION A THIN FILM TO AFFECTED AREA EXTERNALLY AS NEEDED FOR VAGINAL ITCHING  cholecalciferol (VITAMIN D3) 2,000 unit capsule Take by mouth  ibuprofen (MOTRIN) 800 MG tablet Take 800 mg by mouth every 8 (eight) hours as needed  letrozole (FEMARA) 2.5 mg tablet Take 2.5 mg by mouth once daily  predniSONE (DELTASONE) 10 MG tablet Take 20 mg by mouth once daily  RESTASIS 0.05 % ophthalmic emulsion Place 1 drop into  both eyes 2 (two) times daily  zinc gluconate 50 mg tablet Take by mouth   No current facility-administered medications on file prior to visit.   Family History  Problem Relation Age of Onset  Stroke Mother  Obesity Mother  High blood pressure (Hypertension) Mother  Diabetes Mother  Breast cancer Mother  Diabetes Sister  Stroke Sister  Hyperlipidemia (Elevated cholesterol) Brother  Heart valve disease Brother  Diabetes Brother    Social History   Tobacco Use  Smoking Status Never  Smokeless Tobacco Never    Social History   Socioeconomic History  Marital status: Married  Tobacco Use  Smoking status: Never  Smokeless tobacco: Never  Substance and Sexual Activity  Alcohol use: Not Currently  Drug use: Never   Objective:   There were no vitals filed for this visit.  There is no height or weight on file to calculate BMI.  Right breast shows multiple scars. The more central upper scars noted. There is minimal erythema and fullness. No fluctuance. Postradiation changes noted.  Assessment and Plan:   Diagnoses and all orders for this visit:  Mastitis, right, acute    She is scheduled for drainage of a chronic seroma from her right breast in about a month. She came in to be checked due to some mild erythema and she thought she noted. There is trace erythema is quite minimal and part of this is postradiation change. It is sore to touch but not exquisitely tender nor fluctuant. She does have a prescription for doxycycline I told her she can  take that for now. If it worsens she will let us know. Plan is for right breast seroma drainage and drain placement in the OR.  No follow-ups on file.  Kennieth Francois, MD

## 2022-05-24 NOTE — Interval H&P Note (Signed)
History and Physical Interval Note:  05/24/2022 9:27 AM  Nichole Ritter  has presented today for surgery, with the diagnosis of SEROMA.  The various methods of treatment have been discussed with the patient and family. After consideration of risks, benefits and other options for treatment, the patient has consented to  Procedure(s): DRAINAGE CHRONIC RIGHT BREAST SEROMA (Right) as a surgical intervention.  The patient's history has been reviewed, patient examined, no change in status, stable for surgery.  I have reviewed the patient's chart and labs.  Questions were answered to the patient's satisfaction.     [place]

## 2022-05-24 NOTE — Anesthesia Postprocedure Evaluation (Signed)
Anesthesia Post Note  Patient: Nichole Ritter  Procedure(s) Performed: DRAINAGE CHRONIC RIGHT BREAST SEROMA (Right: Breast)     Patient location during evaluation: PACU Anesthesia Type: General Level of consciousness: awake and alert Pain management: pain level controlled Vital Signs Assessment: post-procedure vital signs reviewed and stable Respiratory status: spontaneous breathing, nonlabored ventilation, respiratory function stable and patient connected to nasal cannula oxygen Cardiovascular status: blood pressure returned to baseline and stable Postop Assessment: no apparent nausea or vomiting Anesthetic complications: no   No notable events documented.  Last Vitals:  Vitals:   05/24/22 1100 05/24/22 1122  BP: 132/64 (!) 130/59  Pulse: (!) 57 (!) 49  Resp: 20 20  Temp:  36.4 C  SpO2: 98% 94%    Last Pain:  Vitals:   05/24/22 1122  TempSrc:   PainSc: 4                  Erling Arrazola S

## 2022-05-24 NOTE — Discharge Instructions (Addendum)
No tylenol until 1:45 pm No ibuprofen until 4:30 p.m.  Post Anesthesia Home Care Instructions  Activity: Get plenty of rest for the remainder of the day. A responsible individual must stay with you for 24 hours following the procedure.  For the next 24 hours, DO NOT: -Drive a car -Paediatric nurse -Drink alcoholic beverages -Take any medication unless instructed by your physician -Make any legal decisions or sign important papers.  Meals: Start with liquid foods such as gelatin or soup. Progress to regular foods as tolerated. Avoid greasy, spicy, heavy foods. If nausea and/or vomiting occur, drink only clear liquids until the nausea and/or vomiting subsides. Call your physician if vomiting continues.  Special Instructions/Symptoms: Your throat may feel dry or sore from the anesthesia or the breathing tube placed in your throat during surgery. If this causes discomfort, gargle with warm salt water. The discomfort should disappear within 24 hours.  If you had a scopolamine patch placed behind your ear for the management of post- operative nausea and/or vomiting:  1. The medication in the patch is effective for 72 hours, after which it should be removed.  Wrap patch in a tissue and discard in the trash. Wash hands thoroughly with soap and water. 2. You may remove the patch earlier than 72 hours if you experience unpleasant side effects which may include dry mouth, dizziness or visual disturbances. 3. Avoid touching the patch. Wash your hands with soap and water after contact with the patch.        JP Drain [employer] this sheet to all of your post-operative appointments while you have your drains. Please measure your drains by CC's or ML's. Make sure you drain and measure your JP Drains 2 or 3 times per day. At the end of each day, add up totals for the left side and add up totals for the right side.    ( 9 am )     ( 3 pm )        ( 9 pm )                Date L  R  L  R  L  R   Total L/R                                                                                                                                                                                       About my Jackson-Pratt Bulb Drain  What is a Jackson-Pratt bulb? A Jackson-Pratt is a soft, round device used to collect drainage. It is connected to a long, thin drainage catheter, which is held in place by one or two  small stiches near your surgical incision site. When the bulb is squeezed, it forms a vacuum, forcing the drainage to empty into the bulb.  Emptying the Jackson-Pratt bulb- To empty the bulb: 1. Release the plug on the top of the bulb. 2. Pour the bulb's contents into a measuring container which your nurse will provide. 3. Record the time emptied and amount of drainage. Empty the drain(s) as often as your     doctor or nurse recommends.  Date                  Time                    Amount (Drain 1)                 Amount (Drain 2)  _____________________________________________________________________  _____________________________________________________________________  _____________________________________________________________________  _____________________________________________________________________  _____________________________________________________________________  _____________________________________________________________________  _____________________________________________________________________  _____________________________________________________________________  Squeezing the Jackson-Pratt Bulb- To squeeze the bulb: 1. Make sure the plug at the top of the bulb is open. 2. Squeeze the bulb tightly in your fist. You will hear air squeezing from the bulb. 3. Replace the plug while the bulb is squeezed. 4. Use a safety pin to attach the bulb to your clothing. This will keep the catheter from     pulling at the bulb insertion site.  When to  call your doctor- Call your doctor if: Drain site becomes red, swollen or hot. You have a fever greater than 101 degrees F. There is oozing at the drain site. Drain falls out (apply a guaze bandage over the drain hole and secure it with tape). Drainage increases daily not related to activity patterns. (You will usually have more drainage when you are active than when you are resting.) Drainage has a bad odor.  About my Jackson-Pratt Bulb Drain  What is a Jackson-Pratt bulb? A Jackson-Pratt is a soft, round device used to collect drainage. It is connected to a long, thin drainage catheter, which is held in place by one or two small stiches near your surgical incision site. When the bulb is squeezed, it forms a vacuum, forcing the drainage to empty into the bulb.  Emptying the Jackson-Pratt bulb- To empty the bulb: 1. Release the plug on the top of the bulb. 2. Pour the bulb's contents into a measuring container which your nurse will provide. 3. Record the time emptied and amount of drainage. Empty the drain(s) as often as your     doctor or nurse recommends.  Date                  Time                    Amount (Drain 1)                 Amount (Drain 2)  _____________________________________________________________________  _____________________________________________________________________  _____________________________________________________________________  _____________________________________________________________________  _____________________________________________________________________  _____________________________________________________________________  _____________________________________________________________________  _____________________________________________________________________  Squeezing the Jackson-Pratt Bulb- To squeeze the bulb: 1. Make sure the plug at the top of the bulb is open. 2. Squeeze the bulb tightly in your fist. You will hear air  squeezing from the bulb. 3. Replace the plug while the bulb is squeezed. 4. Use a safety pin to attach the bulb to your clothing. This will keep the catheter from     pulling at the bulb insertion site.  When to call your doctor- Call your doctor if: Drain site becomes red,  swollen or hot. You have a fever greater than 101 degrees F. There is oozing at the drain site. Drain falls out (apply a guaze bandage over the drain hole and secure it with tape). Drainage increases daily not related to activity patterns. (You will usually have more drainage when you are active than when you are resting.) Drainage has a bad odor.

## 2022-05-24 NOTE — Anesthesia Procedure Notes (Signed)
Procedure Name: LMA Insertion Date/Time: 05/24/2022 10:00 AM  Performed by: Bufford Spikes, CRNAPre-anesthesia Checklist: Patient identified, Emergency Drugs available, Suction available and Patient being monitored Patient Re-evaluated:Patient Re-evaluated prior to induction Oxygen Delivery Method: Circle system utilized Preoxygenation: Pre-oxygenation with 100% oxygen Induction Type: IV induction Ventilation: Mask ventilation without difficulty LMA: LMA inserted LMA Size: 4.0 Number of attempts: 1 Placement Confirmation: positive ETCO2 Tube secured with: Tape Dental Injury: Teeth and Oropharynx as per pre-operative assessment

## 2022-05-24 NOTE — Op Note (Signed)
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  She was managed with aspiration and close follow-up.  She unfortunately has not improved and I recommend operative invention since she has failed nonoperative management.  Risk of surgery include bleeding, infection, continued seroma, chronic pain, poor wound healing, cosmetic deformity and the success of procedure and the circumstances are about 90%.  Observation was also offered.  She opted for surgical revision.  Description of procedure: The patient was met in the holding area and questions were answered.  Right side was marked as correct site.  She is taken back to the operative room.  She is placed spinal upon the OR table.  After induction of general anesthesia, right breast was prepped and draped in sterile fashion.  Timeout performed.  There were 2 incisions 1 on the superior aspect of the right breast and one more lateral.  The swelling was located on central both of these.  I made a 3 cm incision through an old scar.  We then dissected down into the cavities of both lumpectomies.  There is a small mount of fluid but significant thickening and scarring which are postradiation in nature.  We removed about 30 cc of serous fluid.  There is no mass lesion or any abnormality as a nidus for this.  Cavity was then drained through a separate stab incision using a 19 round drain.  There is no signs of infection or any residual malignant type change.   No biopsies were taken since the cavity appeared grossly normal.  The drain was secured with 2-0 nylon.  Local anesthetic was infiltrated and we closed the skin with 3-0 Vicryl and 4 Monocryl.  Dermabond was applied.  All counts found to be correct.  Total fluid noted in the case that was either suction to remove but the drain is about 60 cc serous nonbloody.  Drains dressing placed.  Binder placed.  All counts found to be correct.  The patient was awoke extubated taken recovery in satisfactory condition.

## 2022-05-24 NOTE — Transfer of Care (Signed)
Immediate Anesthesia Transfer of Care Note  Patient: Nichole Ritter  Procedure(s) Performed: DRAINAGE CHRONIC RIGHT BREAST SEROMA (Right: Breast)  Patient Location: PACU  Anesthesia Type:General  Level of Consciousness: awake, alert  and oriented  Airway & Oxygen Therapy: Patient Spontanous Breathing and Patient connected to nasal cannula oxygen  Post-op Assessment: Report given to RN and Post -op Vital signs reviewed and stable  Post vital signs: Reviewed and stable  Last Vitals:  Vitals Value Taken Time  BP 132/64 05/24/22 1100  Temp 36.5 C 05/24/22 1045  Pulse 59 05/24/22 1113  Resp 16 05/24/22 1113  SpO2 96 % 05/24/22 1113  Vitals shown include unvalidated device data.  Last Pain:  Vitals:   05/24/22 1115  TempSrc:   PainSc: 4       Patients Stated Pain Goal: 2 (76/39/43 2003)  Complications: No notable events documented.

## 2022-05-25 ENCOUNTER — Encounter (HOSPITAL_BASED_OUTPATIENT_CLINIC_OR_DEPARTMENT_OTHER): Payer: Self-pay | Admitting: Surgery

## 2022-06-28 IMAGING — MR MR BREAST BILAT WO/W CM
8 of 13 series · 28 of 48 positions shown · IV contrast (9 GADAVIST)
Comparison: Prior exams

CLINICAL DATA: Staging for recently diagnosed right Patient
underwent 2 stereotactic core needle biopsies breast carcinoma right
breast, an architectural distortion, 9 o'clock, middle depth, and
suspicious calcifications at 12 o'clock. Pathology revealed invasive
carcinoma and DCIS.

LABS:  No labs drawn at time of imaging.
EXAM:
BILATERAL BREAST MRI WITH AND WITHOUT CONTRAST
TECHNIQUE: Multiplanar, multisequence MR images of both breasts were obtained
prior to and following the intravenous administration of 9 ml of
Gadavist

[Series 2: T2 · axial · 3.0mm · 0.98mm/px · z∈[-42,+129]mm · 2 of 57 slices shown]
[im 1/57]
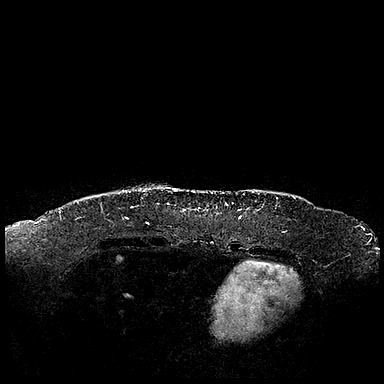
[im 57/57]
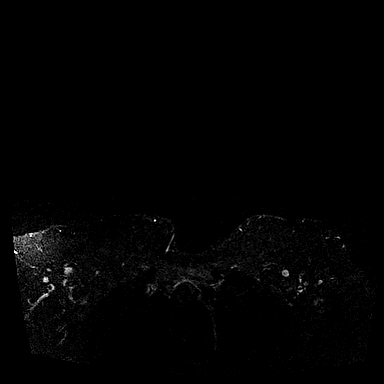

[Series 3: T1 fat-sat · axial · 1.2mm · 0.84mm/px · z∈[-42,+129]mm · 5 of 144 slices shown (1 of 5)]
[im 1/144]
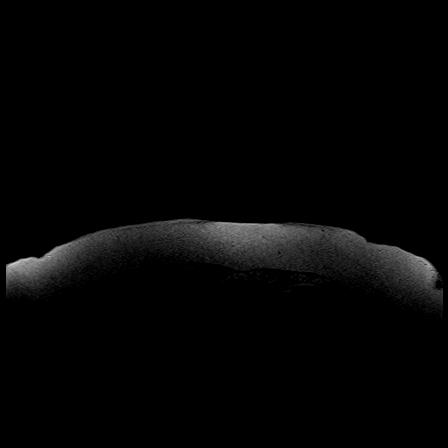
[im 36/144]
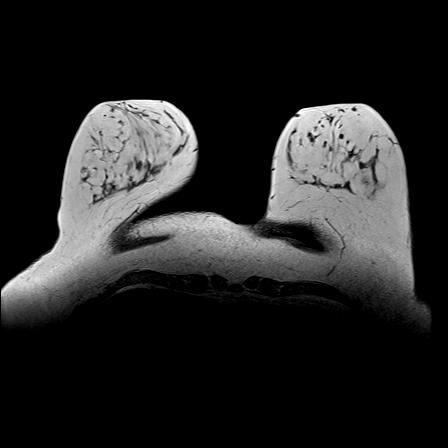
[im 72/144]
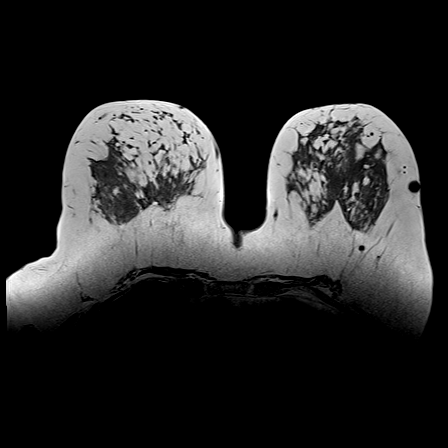
[im 108/144]
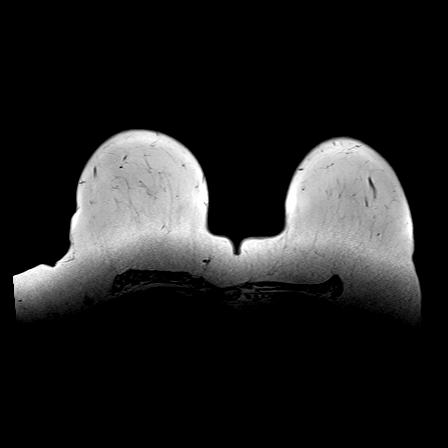
[im 144/144]
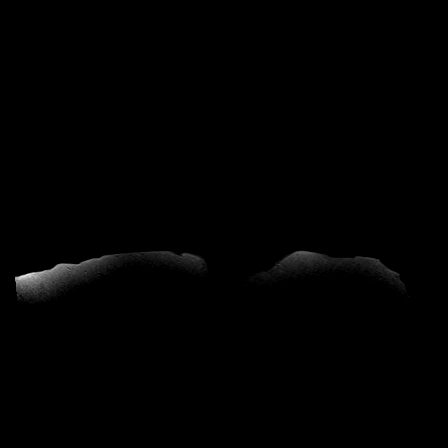

[Series 4: T1 fat-sat · axial · 1.6mm · 0.90mm/px · z∈[-45,+132]mm · 4 of 112 slices shown (2 of 5)]
[im 1/112]
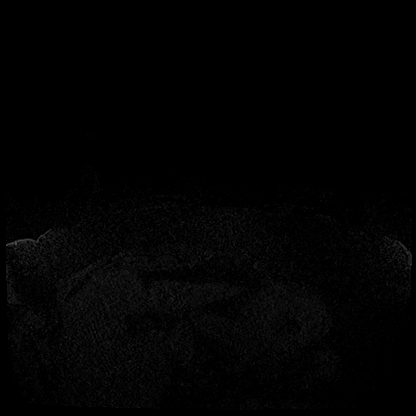
[im 38/112]
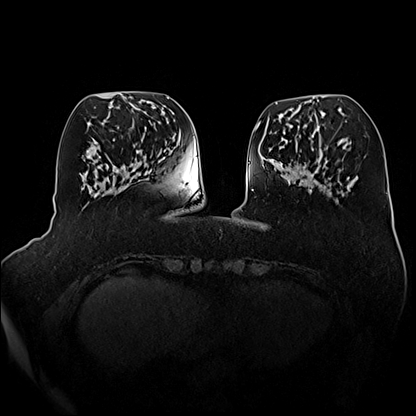
[im 75/112]
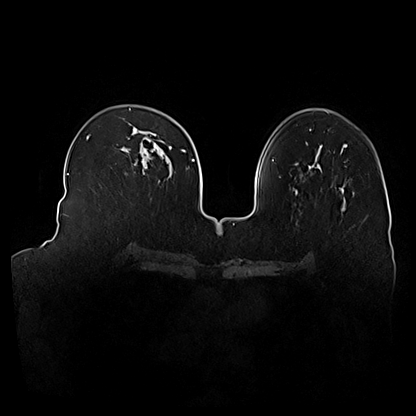
[im 112/112]
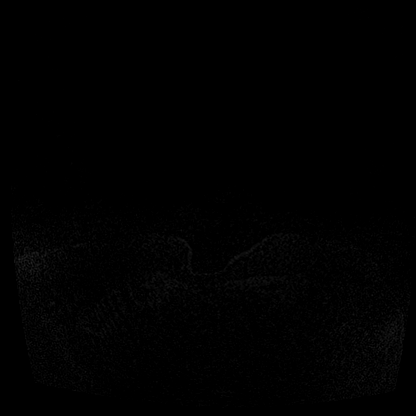

[Series 5: T1 fat-sat · axial · 1.6mm · 0.90mm/px · z∈[-45,+132]mm · 4 of 112 slices shown (3 of 5)]
[im 1/112]
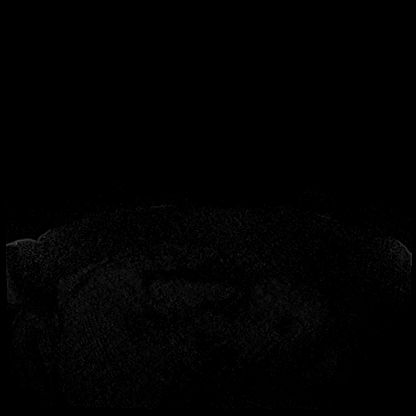
[im 38/112]
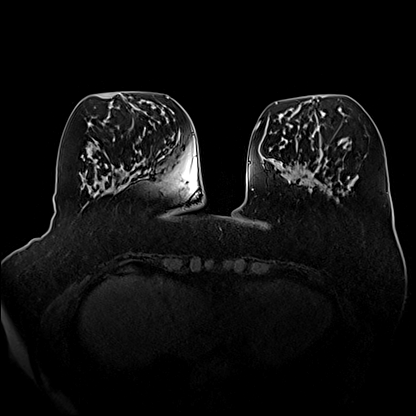
[im 75/112]
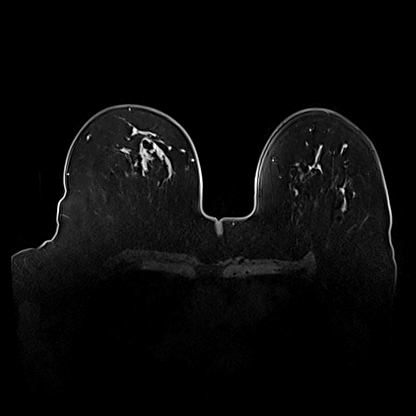
[im 112/112]
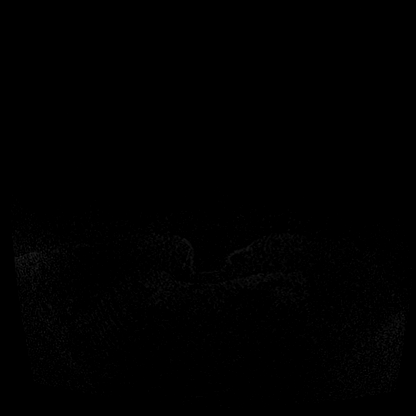

[Series 6: T1 fat-sat · axial · 1.6mm · 0.90mm/px · z∈[-45,+132]mm · 4 of 112 slices shown (4 of 5)]
[im 1/112]
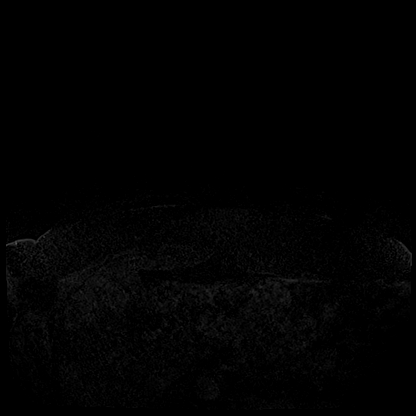
[im 38/112]
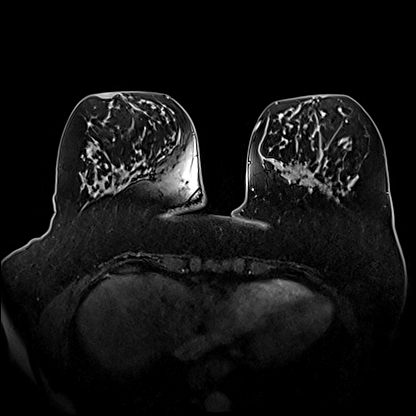
[im 75/112]
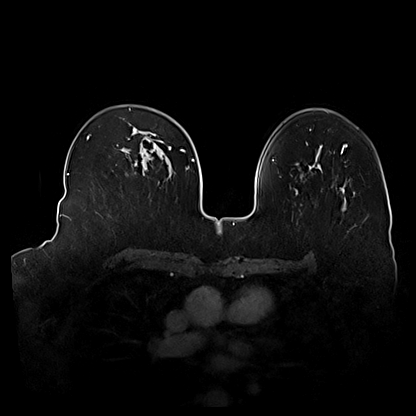
[im 112/112]
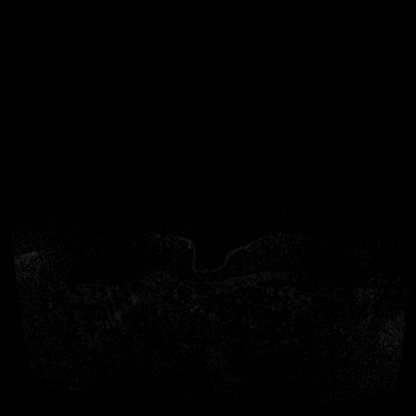

[Series 7: T1 · axial · 1.6mm · 0.90mm/px · z∈[-45,+132]mm · 4 of 112 slices shown (1 of 2)]
[im 1/112]
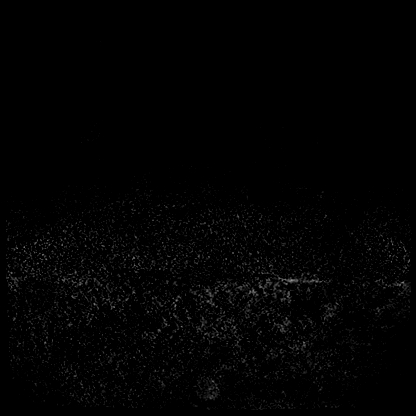
[im 38/112]
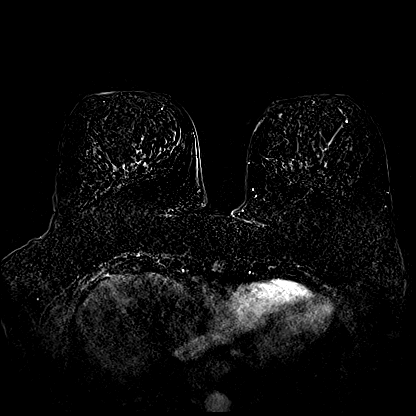
[im 75/112]
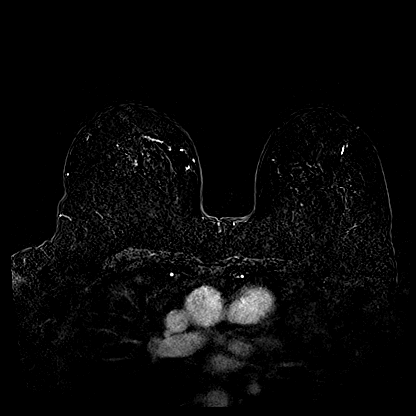
[im 112/112]
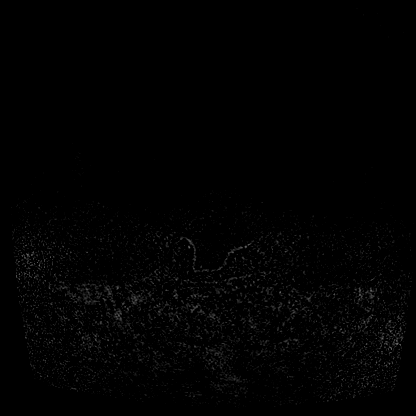

[Series 9: T1 · axial · 179.2mm · 0.90mm/px · 1 of 3 slices shown (2 of 2)]
[im 1/3]
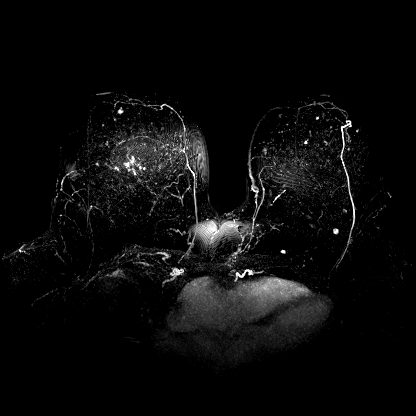

[Series 10: T1 fat-sat · axial · 1.6mm · 0.90mm/px · z∈[-45,+132]mm · 4 of 112 slices shown (5 of 5)]
[im 1/112]
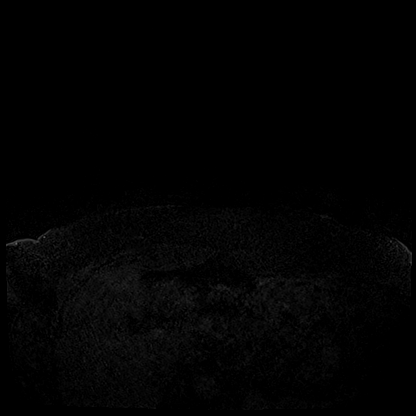
[im 38/112]
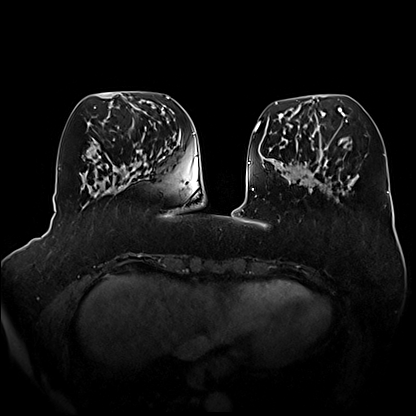
[im 75/112]
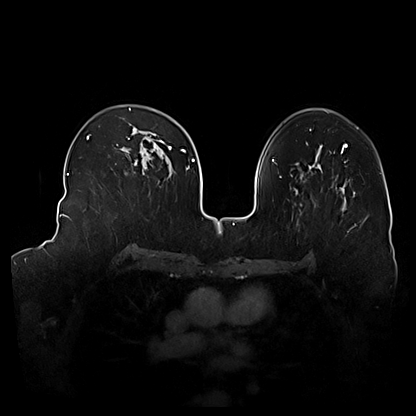
[im 112/112]
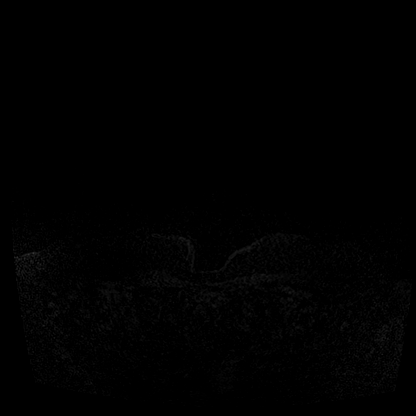

[28 of 48 positions shown; findings below may reference images not displayed]

Three-dimensional MR images were rendered by post-processing of the
original MR data on an independent workstation. The
three-dimensional MR images were interpreted, and findings are
reported in the following complete MRI report for this study. Three
dimensional images were evaluated at the independent interpreting
workstation using the DynaCAD thin client.
FINDINGS: Breast composition: c. Heterogeneous fibroglandular tissue.

Background parenchymal enhancement: Moderate.

Right breast: Irregular enhancing mass in the 12 o'clock position of
the right breast, middle depth, associated with susceptibility
artifact reflecting the barbell shaped post biopsy marker clip. This
measures 2.2 x 1.7 x 1.8 cm.

Focal elongated area of non mass enhancement in the lateral right
breast, middle depth, associated with susceptibility artifact
reflecting the cylinder shaped biopsy clip. This measures 3 cm from
right to left, some of which is likely post biopsy change, by 1.1 x
1.0 cm AP x SI.

There are no other areas of abnormal enhancement, apart from
background, to suggest additional foci of malignancy.

Left breast: No mass or abnormal enhancement.

Lymph nodes: No abnormal appearing lymph nodes.

Ancillary findings: 3 subcentimeter T2 signal hyperintense liver
lesions consistent with cysts.
IMPRESSION: 1. Two abnormal enhancing lesions in the right breast reflecting the
recently diagnosed areas of malignancy.
2. No evidence of additional right breast malignancy.
3. No evidence of left breast malignancy.
4. No evidence of metastatic lymphadenopathy.

RECOMMENDATION:
1. Treatment as planned for the known right breast malignancies.

BI-RADS CATEGORY  6: Known biopsy-proven malignancy.

## 2022-08-25 NOTE — Progress Notes (Signed)
Patient Care Team: [name], MD as PCP - General (Family Medicine) Jerline Pain, MD as PCP - Cardiology (Cardiology) Erroll Luna, MD as Consulting Physician (General Surgery) Nicholas Lose, MD as Consulting Physician (Hematology and Oncology) Kyung Rudd, MD as Consulting Physician (Radiation Oncology)  DIAGNOSIS: No diagnosis found.  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 ER greater than 95%, PR 40%   05/10/2021 Cancer Staging   Staging form: Breast, AJCC 8th Edition - Clinical stage from 05/10/2021: cT1b, cN0, cM0, G1, ER+, PR+, HER2: Unknown - Signed by Nicholas Lose, MD on 05/10/2021 Stage prefix: Initial diagnosis Histologic grading system: 3 grade system Laterality: Right Staged by: Pathologist and managing physician Stage used in treatment planning: Yes National guidelines used in treatment planning: Yes Type of national guideline used in treatment planning: NCCN   05/20/2021 Genetic Testing   Negative genetic testing:  No pathogenic variants detected on the [facility] BRCAplus panel (report date 05/20/2021) or the CancerNext-Expanded + RNAinsight panel (report date 05/22/2021). 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  The BRCAplus panel offered by [employer] and includes sequencing and deletion/duplication analysis for the following 8 genes: ATM, BRCA1, BRCA2, CDH1, CHEK2, PALB2, PTEN, and TP53. The CancerNext-Expanded + RNAinsight gene panel offered by [employer] and includes sequencing and rearrangement analysis for the  following 77 genes: AIP, ALK, APC, ATM, AXIN2, BAP1, BARD1, BLM, BMPR1A, BRCA1, BRCA2, BRIP1, CDC73, CDH1, CDK4, CDKN1B, CDKN2A, CHEK2, CTNNA1, DICER1, FANCC, FH, FLCN, GALNT12, KIF1B, LZTR1, MAX, MEN1, MET, MLH1, MSH2, MSH3, MSH6, MUTYH, NBN, NF1, NF2, NTHL1, PALB2, PHOX2B, PMS2, POT1, PRKAR1A, PTCH1, PTEN, RAD51C, RAD51D, RB1, RECQL, RET, SDHA, SDHAF2, SDHB, SDHC, SDHD, SMAD4, SMARCA4, SMARCB1, SMARCE1, STK11, SUFU, TMEM127, TP53, TSC1, TSC2, VHL and XRCC2 (sequencing and deletion/duplication); EGFR, EGLN1, HOXB13, KIT, MITF, PDGFRA, POLD1 and POLE (sequencing only); EPCAM and GREM1 (deletion/duplication only). RNA data is routinely analyzed for use in variant interpretation for all genes.   06/15/2021 Definitive Surgery   FINAL MICROSCOPIC DIAGNOSIS:   A. BREAST, RIGHT SUPERIOR, LUMPECTOMY:  - Ductal carcinoma in situ, high-grade with necrosis and calcifications  - Lobular carcinoma in situ  - No evidence of invasive carcinoma  - Resection margins are negative for DCIS  - Biopsy site changes  - See oncology table   B. BREAST, RIGHT LATERAL, LUMPECTOMY:  - Invasive lobular carcinoma, 1.3 cm, grade 2, see comment  - Resection margins are negative for carcinoma  - Focal atypical ductal hyperplasia  - Atypical lobular hyperplasia  - Fibrocystic change with calcifications  - Biopsy site changes   C. BREAST, RIGHT LATERAL ADDITIONAL ANTEROMEDIAL MARGIN, EXCISION:  - Benign breast parenchyma, negative for carcinoma   D. BREAST, RIGHT LATERAL ADDITIONAL INFERIOR/LATERAL MARGIN, EXCISION:  - Benign breast parenchyma, negative for carcinoma   E. BREAST, RIGHT LATERAL ADDITIONAL POSTERIOR/SUPERIOR MARGIN, EXCISION:  - Benign breast parenchyma, negative for carcinoma   F. LYMPH NODE, RIGHT AXILLARY, SENTINEL, EXCISION:  - Lymph node, negative for carcinoma (0/1), see comment   COMMENT:  B and F.   Immunohistochemical stain for E-cadherin is negative in the  tumor cells, consistent with a  lobular phenotype.  Immunostain for  cytokeratin  AE1/AE3 performed on part F is negative for metastatic  carcinoma.   ADDENDUM:  B5.  PROGNOSTIC INDICATOR RESULTS:  The tumor cells are NEGATIVE for Her2 (1+).  Estrogen Receptor: POSITIVE, 80% MODERATE STAINING INTENSITY  Progesterone Receptor: POSITIVE, 60% MODERATE STAINING INTENSITY  Proliferation Marker Ki-67: 1%    06/30/2021 Oncotype testing   Oncotype DX: Recurrence score: 19, distant recurrence at 9 years: 6%   07/26/2021 - 08/21/2021 Radiation Therapy   Adjuvant radiation   09/12/2021 -  Anti-estrogen oral therapy   Adjuvant letrozole     CHIEF COMPLIANT: Follow-up right breast cancer currently on letrozole  INTERVAL HISTORY: Nichole Ritter is a  71 y.o. with above-mentioned history of right breast cancer having undergone right lumpectomy, currently on radiation therapy. She presents to the clinic today for follow-up.    ALLERGIES:  is allergic to caine-1 [lidocaine], esomeprazole magnesium, formaldehyde, and nickel.  MEDICATIONS:  Current Outpatient Medications  Medication Sig Dispense Refill   acetaminophen (TYLENOL) 325 MG tablet Take 650 mg by mouth every 6 (six) hours as needed.     atorvastatin (LIPITOR) 10 MG tablet Take 10 mg by mouth daily.     B Complex-C (SUPER B COMPLEX PO) Take 1 tablet by mouth daily.      betamethasone dipropionate (DIPROLENE) 0.05 % cream Apply 1 application topically 2 (two) times daily as needed.      Calcium-Magnesium-Vitamin D (CALCIUM 1200+D3 PO) Take 1 tablet by mouth daily.      Cholecalciferol (VITAMIN D3) 50 MCG (2000 UT) capsule Take 2,000 Units by mouth daily.     cycloSPORINE (RESTASIS) 0.05 % ophthalmic emulsion 1 drop 2 (two) times daily.     fluticasone (FLONASE) 50 MCG/ACT nasal spray Place into both nostrils daily.     ibuprofen (ADVIL) 200 MG tablet Take 200 mg by mouth every 6 (six) hours as needed for mild pain.     ibuprofen (ADVIL) 800 MG tablet Take 1 tablet (800 mg  total) by mouth every 8 (eight) hours as needed. 30 tablet 0   LUTEIN PO Take by mouth.     meloxicam (MOBIC) 15 MG tablet Take 15 mg by mouth daily.     Multiple Vitamins-Minerals (CENTRUM SILVER 50+WOMEN PO) Take 1 tablet by mouth daily.      OMEPRAZOLE PO Take 20 mg by mouth.     oxyCODONE (OXY IR/ROXICODONE) 5 MG immediate release tablet Take 1 tablet (5 mg total) by mouth every 6 (six) hours as needed for severe pain. 15 tablet 0   zinc gluconate 50 MG tablet Take 50 mg by mouth daily.     No current facility-administered medications for this visit.    PHYSICAL EXAMINATION: ECOG PERFORMANCE STATUS: {CHL ONC ECOG [phone]}  There were no vitals filed for this visit. There were no vitals filed for this visit.  BREAST:*** No palpable masses or nodules in either right or left breasts. No palpable axillary supraclavicular or infraclavicular adenopathy no breast tenderness or nipple discharge. (exam performed in the presence of a chaperone)  LABORATORY DATA:  I have reviewed the data as listed    Latest Ref Rng & Units 12/14/2021    7:03 PM 05/10/2021    8:19 AM 12/02/2018   10:06 AM  CMP  Glucose 70 - 99 mg/dL 123  96    BUN 8 - 23 mg/dL 10  10    Creatinine 0.44 - 1.00 mg/dL 0.86  0.81    Sodium 135 - 145 mmol/L 135  142  Potassium 3.5 - 5.1 mmol/L 3.1  4.0    Chloride 98 - 111 mmol/L 102  105    CO2 22 - 32 mmol/L 22  28    Calcium 8.9 - 10.3 mg/dL 8.9  9.5    Total Protein 6.5 - 8.1 g/dL 6.7  7.4    Total Bilirubin 0.3 - 1.2 mg/dL 0.7  0.8    Alkaline Phos 38 - 126 U/L 74  81    AST 15 - 41 U/L 28  21    ALT 0 - 44 U/L _0 Lab Results  Component Value Date   WBC 11.1 (H) 12/14/2021   HGB 14.6 12/14/2021   HCT 42.4 12/14/2021   MCV 92.2 12/14/2021   PLT 336 12/14/2021   NEUTROABS 3.4 05/10/2021    ASSESSMENT & PLAN:  No problem-specific Assessment & Plan notes found for this encounter.    No orders of the defined types were placed in this  encounter.  The patient has a good understanding of the overall plan. she agrees with it. she will call with any problems that may develop before the next visit here. Total time spent: 30 mins including face to face time and time spent for planning, charting and co-ordination of care   Suzzette Righter, [place] 08/25/22    I Gardiner Coins am scribing for Dr. Lindi Adie  ***

## 2022-08-29 ENCOUNTER — Inpatient Hospital Stay: Payer: Medicare Other | Attending: Hematology and Oncology | Admitting: Hematology and Oncology

## 2022-08-29 DIAGNOSIS — Z79811 Long term (current) use of aromatase inhibitors: Secondary | ICD-10-CM | POA: Insufficient documentation

## 2022-08-29 DIAGNOSIS — Z170 Estrogen receptor positive status [ER+]: Secondary | ICD-10-CM | POA: Insufficient documentation

## 2022-08-29 DIAGNOSIS — Z923 Personal history of irradiation: Secondary | ICD-10-CM | POA: Diagnosis not present

## 2022-08-29 DIAGNOSIS — Z79899 Other long term (current) drug therapy: Secondary | ICD-10-CM | POA: Diagnosis not present

## 2022-08-29 DIAGNOSIS — C50411 Malignant neoplasm of upper-outer quadrant of right female breast: Secondary | ICD-10-CM | POA: Diagnosis not present

## 2022-08-29 NOTE — Assessment & Plan Note (Signed)
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 Breast infection 03/30/2022: Treated with antibiotics  Breast cancer surveillance: 1.  Breast exam 08/29/2022: Benign 2. mammogram 03/06/2022 at [facility]: 0.7 cm oval mass in the left breast Ultrasound at [facility]: 0.7 cm normal-appearing lymph node in the left breast is probably benign follow-up in 6 months recommended.  This has been arranged for 09/11/2022.  Bone density 03/13/2022: T score -1.5: Osteopenia: Calcium and vitamin D  Return to clinic in 1 year for follow-up

## 2022-09-03 DIAGNOSIS — J40 Bronchitis, not specified as acute or chronic: Secondary | ICD-10-CM | POA: Diagnosis not present

## 2022-09-03 DIAGNOSIS — J329 Chronic sinusitis, unspecified: Secondary | ICD-10-CM | POA: Diagnosis not present

## 2022-09-03 DIAGNOSIS — H6993 Unspecified Eustachian tube disorder, bilateral: Secondary | ICD-10-CM | POA: Diagnosis not present

## 2022-09-07 DIAGNOSIS — Z170 Estrogen receptor positive status [ER+]: Secondary | ICD-10-CM | POA: Diagnosis not present

## 2022-09-07 DIAGNOSIS — C50411 Malignant neoplasm of upper-outer quadrant of right female breast: Secondary | ICD-10-CM | POA: Diagnosis not present

## 2022-09-10 ENCOUNTER — Ambulatory Visit: Payer: Medicare Other | Attending: Surgery

## 2022-09-10 VITALS — Wt 196.0 lb

## 2022-09-10 DIAGNOSIS — Z483 Aftercare following surgery for neoplasm: Secondary | ICD-10-CM | POA: Insufficient documentation

## 2022-09-10 NOTE — Therapy (Signed)
OUTPATIENT PHYSICAL THERAPY SOZO SCREENING NOTE   Patient Name: Nichole Ritter MRN: [id number] DOB:04/09/1951, 71 y.o., female Today's Date: 09/10/2022  PCP: [name], MD REFERRING PROVIDER: Erroll Luna, MD   PT End of Session - 09/10/22 1018     Visit Number 8   # unchanged due to screen only   PT Start Time 1017    PT Stop Time 1022    PT Time Calculation (min) 5 min    Activity Tolerance Patient tolerated treatment well    Behavior During Therapy WFL for tasks assessed/performed             Past Medical History:  Diagnosis Date   Allergic rhinitis    Aortic atherosclerosis ([place])    Arthritis    Barrett esophagus    Barrett's esophagus    Bunion    Cancer ([place])    right breast [facility]   Family history of breast cancer    Family history of leukemia    Family history of lung cancer    Family history of ovarian cancer    Family history of prostate cancer    Female cystocele    GERD (gastroesophageal reflux disease)    History of epistaxis 2012   Hyperlipidemia    UNSPECIFIED   Lichen sclerosus    Obesity    Osteopenia    Personal history of colonic polyps    Rectocele    Situational stress    Stress incontinence    Vitamin D deficiency    Past Surgical History:  Procedure Laterality Date   BREAST LUMPECTOMY     BREAST LUMPECTOMY WITH RADIOACTIVE SEED AND SENTINEL LYMPH NODE BIOPSY Right 06/15/2021   Procedure: RIGHT BREAST LUMPECTOMY WITH RADIOACTIVE SEED X 2 AND SENTINEL LYMPH NODE BIOPSY;  Surgeon: Erroll Luna, MD;  Location: [place];  Service: General;  Laterality: Right;   CATARACT EXTRACTION Bilateral 02/2018   03/2018   COLONOSCOPY  01/2013   EVACUATION BREAST HEMATOMA Right 05/24/2022   Procedure: DRAINAGE CHRONIC RIGHT BREAST SEROMA;  Surgeon: Erroll Luna, MD;  Location: [place];  Service: General;  Laterality: Right;   LUMBAR [place] [facility]     TUBAL LIGATION     Patient Active Problem List    Diagnosis Date Noted   Genetic testing 05/20/2021   Family history of prostate cancer 05/10/2021   Family history of breast cancer 05/10/2021   Family history of ovarian cancer 05/10/2021   Family history of leukemia 05/10/2021   Family history of lung cancer 05/10/2021   Malignant neoplasm of upper-outer quadrant of right female breast ([place]) 05/05/2021   Aortic atherosclerosis ([place]) 01/06/2019   Mixed hyperlipidemia 01/06/2019   Family history of arteriosclerotic cardiovascular disease 01/06/2019    REFERRING DIAG: right breast cancer at risk for lymphedema  THERAPY DIAG: Aftercare following surgery for neoplasm  PERTINENT HISTORY: Patient was diagnosed on 02/21/2021 with right invasive lobular carcinoma breast cancer. She underwent a right lumpectomy and sentinel node biopsy (1 negative node) on 06/15/2021. 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 "I'm going to go ahead and have the hematoma removed next week because it keeps getting infected."  PAIN:  Are you having pain? No  SOZO SCREENING: Patient was assessed today using the SOZO machine to determine the lymphedema index score. This was compared to her baseline score. It was determined that she is  within the recommended range when compared to her baseline and no further action is needed at this time. She will continue SOZO screenings. These are done every 3 months for 2 years post operatively followed by every 6 months for 2 years, and then annually.   L-DEX FLOWSHEETS - 09/10/22 1000       L-DEX LYMPHEDEMA SCREENING   Measurement Type Unilateral    L-DEX MEASUREMENT EXTREMITY Upper Extremity    POSITION  Standing    DOMINANT SIDE Right    At Risk Side Right    BASELINE SCORE (UNILATERAL) 1.7    L-DEX SCORE (UNILATERAL) 4.4    VALUE CHANGE (UNILAT) 2.7              Otelia Limes,  PTA 09/10/2022, 10:19 AM

## 2022-09-11 DIAGNOSIS — R928 Other abnormal and inconclusive findings on diagnostic imaging of breast: Secondary | ICD-10-CM | POA: Diagnosis not present

## 2022-09-11 DIAGNOSIS — R922 Inconclusive mammogram: Secondary | ICD-10-CM | POA: Diagnosis not present

## 2022-09-12 ENCOUNTER — Encounter: Payer: Self-pay | Admitting: Hematology and Oncology

## 2022-09-25 DIAGNOSIS — E559 Vitamin D deficiency, unspecified: Secondary | ICD-10-CM | POA: Diagnosis not present

## 2022-09-25 DIAGNOSIS — Z0000 Encounter for general adult medical examination without abnormal findings: Secondary | ICD-10-CM | POA: Diagnosis not present

## 2022-09-25 DIAGNOSIS — Z79899 Other long term (current) drug therapy: Secondary | ICD-10-CM | POA: Diagnosis not present

## 2022-09-25 DIAGNOSIS — Z23 Encounter for immunization: Secondary | ICD-10-CM | POA: Diagnosis not present

## 2022-09-25 DIAGNOSIS — L900 Lichen sclerosus et atrophicus: Secondary | ICD-10-CM | POA: Diagnosis not present

## 2022-09-25 DIAGNOSIS — K219 Gastro-esophageal reflux disease without esophagitis: Secondary | ICD-10-CM | POA: Diagnosis not present

## 2022-09-25 DIAGNOSIS — I700 Atherosclerosis of aorta: Secondary | ICD-10-CM | POA: Diagnosis not present

## 2022-09-25 DIAGNOSIS — R7303 Prediabetes: Secondary | ICD-10-CM | POA: Diagnosis not present

## 2022-09-25 DIAGNOSIS — M8580 Other specified disorders of bone density and structure, unspecified site: Secondary | ICD-10-CM | POA: Diagnosis not present

## 2022-09-25 DIAGNOSIS — L989 Disorder of the skin and subcutaneous tissue, unspecified: Secondary | ICD-10-CM | POA: Diagnosis not present

## 2022-09-25 DIAGNOSIS — C50411 Malignant neoplasm of upper-outer quadrant of right female breast: Secondary | ICD-10-CM | POA: Diagnosis not present

## 2022-09-25 DIAGNOSIS — E785 Hyperlipidemia, unspecified: Secondary | ICD-10-CM | POA: Diagnosis not present

## 2022-09-25 DIAGNOSIS — M5136 Other intervertebral disc degeneration, lumbar region: Secondary | ICD-10-CM | POA: Diagnosis not present

## 2022-11-14 DIAGNOSIS — L816 Other disorders of diminished melanin formation: Secondary | ICD-10-CM | POA: Diagnosis not present

## 2022-11-14 DIAGNOSIS — L814 Other melanin hyperpigmentation: Secondary | ICD-10-CM | POA: Diagnosis not present

## 2022-11-14 DIAGNOSIS — D229 Melanocytic nevi, unspecified: Secondary | ICD-10-CM | POA: Diagnosis not present

## 2022-11-14 DIAGNOSIS — L821 Other seborrheic keratosis: Secondary | ICD-10-CM | POA: Diagnosis not present

## 2022-11-14 DIAGNOSIS — L578 Other skin changes due to chronic exposure to nonionizing radiation: Secondary | ICD-10-CM | POA: Diagnosis not present

## 2022-11-14 DIAGNOSIS — D1801 Hemangioma of skin and subcutaneous tissue: Secondary | ICD-10-CM | POA: Diagnosis not present

## 2022-11-14 DIAGNOSIS — L309 Dermatitis, unspecified: Secondary | ICD-10-CM | POA: Diagnosis not present

## 2022-12-10 ENCOUNTER — Ambulatory Visit: Payer: Medicare Other | Attending: Surgery

## 2022-12-10 VITALS — Wt 197.1 lb

## 2022-12-10 DIAGNOSIS — Z483 Aftercare following surgery for neoplasm: Secondary | ICD-10-CM | POA: Insufficient documentation

## 2022-12-10 NOTE — Therapy (Signed)
OUTPATIENT PHYSICAL THERAPY SOZO SCREENING NOTE   Patient Name: Nichole Ritter MRN: [id number] DOB:06-28-1951, 72 y.o., female Today's Date: 12/10/2022  PCP: [name], MD REFERRING PROVIDER: Erroll Luna, MD   PT End of Session - 12/10/22 1600     Visit Number 8   # unchanged due to screen only   PT Start Time 23    PT Stop Time 1602    PT Time Calculation (min) 4 min    Activity Tolerance Patient tolerated treatment well    Behavior During Therapy WFL for tasks assessed/performed             Past Medical History:  Diagnosis Date   Allergic rhinitis    Aortic atherosclerosis ([place])    Arthritis    Barrett esophagus    Barrett's esophagus    Bunion    Cancer ([place])    right breast [facility]   Family history of breast cancer    Family history of leukemia    Family history of lung cancer    Family history of ovarian cancer    Family history of prostate cancer    Female cystocele    GERD (gastroesophageal reflux disease)    History of epistaxis 2012   Hyperlipidemia    UNSPECIFIED   Lichen sclerosus    Obesity    Osteopenia    Personal history of colonic polyps    Rectocele    Situational stress    Stress incontinence    Vitamin D deficiency    Past Surgical History:  Procedure Laterality Date   BREAST LUMPECTOMY     BREAST LUMPECTOMY WITH RADIOACTIVE SEED AND SENTINEL LYMPH NODE BIOPSY Right 06/15/2021   Procedure: RIGHT BREAST LUMPECTOMY WITH RADIOACTIVE SEED X 2 AND SENTINEL LYMPH NODE BIOPSY;  Surgeon: Erroll Luna, MD;  Location: [place];  Service: General;  Laterality: Right;   CATARACT EXTRACTION Bilateral 02/2018   03/2018   COLONOSCOPY  01/2013   EVACUATION BREAST HEMATOMA Right 05/24/2022   Procedure: DRAINAGE CHRONIC RIGHT BREAST SEROMA;  Surgeon: Erroll Luna, MD;  Location: [place];  Service: General;  Laterality: Right;   LUMBAR [place] [facility]     TUBAL LIGATION     Patient Active Problem List    Diagnosis Date Noted   Genetic testing 05/20/2021   Family history of prostate cancer 05/10/2021   Family history of breast cancer 05/10/2021   Family history of ovarian cancer 05/10/2021   Family history of leukemia 05/10/2021   Family history of lung cancer 05/10/2021   Malignant neoplasm of upper-outer quadrant of right female breast ([place]) 05/05/2021   Aortic atherosclerosis ([place]) 01/06/2019   Mixed hyperlipidemia 01/06/2019   Family history of arteriosclerotic cardiovascular disease 01/06/2019    REFERRING DIAG: right breast cancer at risk for lymphedema  THERAPY DIAG: Aftercare following surgery for neoplasm  PERTINENT HISTORY: Patient was diagnosed on 02/21/2021 with right invasive lobular carcinoma breast cancer. She underwent a right lumpectomy and sentinel node biopsy (1 negative node) on 06/15/2021. 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 "I've done well since they removed the hematoma."  PAIN:  Are you having pain? No  SOZO SCREENING: Patient was assessed today using the SOZO machine to determine the lymphedema index score. This was compared to her baseline score. It was determined that she is within the recommended range when compared to her baseline  and no further action is needed at this time. She will continue SOZO screenings. These are done every 3 months for 2 years post operatively followed by every 6 months for 2 years, and then annually.   L-DEX FLOWSHEETS - 12/10/22 1600       L-DEX LYMPHEDEMA SCREENING   Measurement Type Unilateral    L-DEX MEASUREMENT EXTREMITY Upper Extremity    POSITION  Standing    DOMINANT SIDE Right    At Risk Side Right    BASELINE SCORE (UNILATERAL) 1.7    L-DEX SCORE (UNILATERAL) 0.7    VALUE CHANGE (UNILAT) -1              Otelia Limes, PTA 12/10/2022, 4:02 PM

## 2022-12-25 DIAGNOSIS — E785 Hyperlipidemia, unspecified: Secondary | ICD-10-CM | POA: Diagnosis not present

## 2022-12-25 DIAGNOSIS — Z79899 Other long term (current) drug therapy: Secondary | ICD-10-CM | POA: Diagnosis not present

## 2022-12-27 DIAGNOSIS — U071 COVID-19: Secondary | ICD-10-CM | POA: Diagnosis not present

## 2023-02-13 DIAGNOSIS — R42 Dizziness and giddiness: Secondary | ICD-10-CM | POA: Diagnosis not present

## 2023-02-13 DIAGNOSIS — R110 Nausea: Secondary | ICD-10-CM | POA: Diagnosis not present

## 2023-02-13 DIAGNOSIS — R531 Weakness: Secondary | ICD-10-CM | POA: Diagnosis not present

## 2023-02-22 DIAGNOSIS — Z170 Estrogen receptor positive status [ER+]: Secondary | ICD-10-CM | POA: Diagnosis not present

## 2023-02-22 DIAGNOSIS — C50411 Malignant neoplasm of upper-outer quadrant of right female breast: Secondary | ICD-10-CM | POA: Diagnosis not present

## 2023-03-04 ENCOUNTER — Encounter: Payer: Self-pay | Admitting: Cardiology

## 2023-03-04 ENCOUNTER — Ambulatory Visit: Payer: Medicare Other | Admitting: Cardiology

## 2023-03-04 VITALS — BP 139/62 | HR 62 | Ht 63.0 in | Wt 200.2 lb

## 2023-03-04 DIAGNOSIS — R0609 Other forms of dyspnea: Secondary | ICD-10-CM | POA: Diagnosis not present

## 2023-03-04 DIAGNOSIS — I700 Atherosclerosis of aorta: Secondary | ICD-10-CM

## 2023-03-04 DIAGNOSIS — E782 Mixed hyperlipidemia: Secondary | ICD-10-CM

## 2023-03-04 DIAGNOSIS — Z8249 Family history of ischemic heart disease and other diseases of the circulatory system: Secondary | ICD-10-CM

## 2023-03-04 DIAGNOSIS — R42 Dizziness and giddiness: Secondary | ICD-10-CM

## 2023-03-04 NOTE — Progress Notes (Signed)
ID:  Cheila Glacken, DOB [birth year], MRN [id number]  PCP:  Laurann Montana, MD  Cardiologist:  Tessa Lerner, DO, [facility] (established care 03/04/23) Former Cardiology Providers: Dr. Donato Schultz   REASON FOR CONSULT: New onset of dizziness with exertion rule out CAD.  REQUESTING PHYSICIAN:  Laurann Montana, MD [address] [place],  [place] [address]  Chief Complaint  Patient presents with   Dizziness   New Patient (Initial Visit)    HPI  Nichole Ritter is a 72 y.o. Caucasian female who presents to the clinic for evaluation of New onset of dizziness with exertion rule out CAD at the request of Laurann Montana, MD. Her past medical history and cardiovascular risk factors include: Hyperlipidemia, Aortic atherosclerosis,  recent COVID-19 infection, seasonal allergies, family history of heart disease, hx of breast cancer s/p lumpectomy and radiation.   Patient was referred to the practice for evaluation of CAD given the new onset of dizziness with exertion.  Patient had COVID-19 infection beginning of March 2024 from which she is recovering.  About 2 weeks ago she started noticing feeling nausea/generalized weakness and dizziness when getting up in the morning.  At times she would feel like she would pass out but she has not had any frank syncope.  She followed up with her PCP regarding the same and is now referred to cardiology for further evaluation and management.  Since her last office visit with PCP patient has stopped taking Claritin and slowly her dyspnea on exertion is improving and she has not had any episodes of near syncope like sensation last 3 days.  Patient was evaluated by Dr. Anne Fu several years ago for CAD.  Patient denies anginal chest pain or heart failure symptoms.  FUNCTIONAL STATUS: No structured exercise program or daily routine.   ALLERGIES: Allergies  Allergen Reactions   Caine-1 [Lidocaine] Other (See Comments)    Itching and swelling locally-had to take  steriods   Letrozole Other (See Comments)   Esomeprazole Magnesium Diarrhea   Formaldehyde Rash   Nickel Rash    MEDICATION LIST PRIOR TO VISIT: Current Meds  Medication Sig   acetaminophen (TYLENOL) 325 MG tablet Take 650 mg by mouth every 6 (six) hours as needed.   atorvastatin (LIPITOR) 10 MG tablet Take 10 mg by mouth daily.   B Complex-C (SUPER B COMPLEX PO) Take 1 tablet by mouth daily.    betamethasone dipropionate (DIPROLENE) 0.05 % cream Apply 1 application topically 2 (two) times daily as needed.    Calcium-Magnesium-Vitamin D (CALCIUM 1200+D3 PO) Take 1 tablet by mouth daily.    Cholecalciferol (VITAMIN D3) 50 MCG (2000 UT) capsule Take 2,000 Units by mouth daily.   cycloSPORINE (RESTASIS) 0.05 % ophthalmic emulsion 1 drop 2 (two) times daily.   fluticasone (FLONASE) 50 MCG/ACT nasal spray Place into both nostrils daily.   ibuprofen (ADVIL) 200 MG tablet Take 200 mg by mouth every 6 (six) hours as needed for mild pain.   LUTEIN PO Take by mouth.   OMEPRAZOLE PO Take 20 mg by mouth.     PAST MEDICAL HISTORY: Past Medical History:  Diagnosis Date   Allergic rhinitis    Aortic atherosclerosis ([facility])    Arthritis    Barrett esophagus    Barrett's esophagus    Bunion    Cancer ([facility])    right breast [facility]   Family history of breast cancer    Family history of leukemia    Family history of lung cancer  Family history of ovarian cancer    Family history of prostate cancer    Female cystocele    GERD (gastroesophageal reflux disease)    History of epistaxis 2012   Hyperlipidemia    UNSPECIFIED   Lichen sclerosus    Obesity    Osteopenia    Personal history of colonic polyps    Rectocele    Situational stress    Stress incontinence    Vitamin D deficiency     PAST SURGICAL HISTORY: Past Surgical History:  Procedure Laterality Date   BREAST LUMPECTOMY     BREAST LUMPECTOMY WITH RADIOACTIVE SEED AND SENTINEL LYMPH NODE BIOPSY Right 06/15/2021   Procedure: RIGHT  BREAST LUMPECTOMY WITH RADIOACTIVE SEED X 2 AND SENTINEL LYMPH NODE BIOPSY;  Surgeon: Harriette Bouillon, MD;  Location: [facility];  Service: General;  Laterality: Right;   CATARACT EXTRACTION Bilateral 02/2018   03/2018   COLONOSCOPY  01/2013   EVACUATION BREAST HEMATOMA Right 05/24/2022   Procedure: DRAINAGE CHRONIC RIGHT BREAST SEROMA;  Surgeon: Harriette Bouillon, MD;  Location: [facility];  Service: General;  Laterality: Right;   LUMBAR [place] [facility]     TUBAL LIGATION      FAMILY HISTORY: The patient family history includes Breast cancer in her niece; Breast cancer (age of onset: 75) in her mother; Cancer in her cousin and maternal aunt; Diabetes in her mother; Heart attack in her paternal uncle; Hypertension in her mother; Leukemia (age of onset: 62) in her brother; Lung cancer (age of onset: 43) in her sister; Ovarian cancer in her niece; Polycythemia in her father; Prostate cancer in her nephew; Prostate cancer (age of onset: 35) in her brother; Stroke in her mother.  SOCIAL HISTORY:  The patient  reports that she has never smoked. She has never used smokeless tobacco. She reports current alcohol use. She reports that she does not use drugs.  REVIEW OF SYSTEMS: Review of Systems  Cardiovascular:  Positive for dyspnea on exertion (better, see HPI) and near-syncope (better, see HPI). Negative for chest pain, claudication, irregular heartbeat, leg swelling, orthopnea, palpitations, paroxysmal nocturnal dyspnea and syncope.  Respiratory:  Negative for shortness of breath.   Hematologic/Lymphatic: Negative for bleeding problem.  Musculoskeletal:  Negative for muscle cramps and myalgias.  Neurological:  Negative for dizziness and light-headedness.    PHYSICAL EXAM:    03/04/2023   12:38 PM 12/10/2022    3:59 PM 09/10/2022   10:18 AM  Vitals with BMI  Height 5\' 3"     Weight 200 lbs 3 oz 197 lbs 2 oz 196 lbs  BMI 35.47  34.73  Systolic 139    Diastolic 62     Pulse 62     Orthostatic VS for the past 72 hrs (Last 3 readings):  Orthostatic BP Patient Position BP Location Cuff Size Orthostatic Pulse  03/04/23 1249 178/81 Standing Left Arm Normal 63  03/04/23 1247 182/74 Sitting Left Arm Normal 57  03/04/23 1246 176/76 Supine Left Arm Normal 54     Physical Exam  Constitutional: No distress.  Age appropriate, hemodynamically stable.   Neck: No JVD present.  Cardiovascular: Normal rate, regular rhythm, S1 normal, S2 normal, intact distal pulses and normal pulses. Exam reveals no gallop, no S3 and no S4.  No murmur heard. Pulses:      Dorsalis pedis pulses are 2+ on the right side and 2+ on the left side.       Posterior tibial pulses are 2+ on the right  side and 2+ on the left side.  Pulmonary/Chest: Effort normal and breath sounds normal. No stridor. She has no wheezes. She has no rales.  Abdominal: Soft. Bowel sounds are normal. She exhibits no distension. There is no abdominal tenderness.  Musculoskeletal:        General: No edema.     Cervical back: Neck supple.  Neurological: She is alert and oriented to person, place, and time. She has intact cranial nerves (2-12).  Skin: Skin is warm and moist.   CARDIAC DATABASE: EKG: Mar 04, 2023: Sinus bradycardia, 56 bpm, low voltage in the precordial leads, consider inferior infarct, poor R wave progression, without underlying injury pattern.  Echocardiogram: No results found for this or any previous visit from the past 1095 days.    Stress Testing: 10/06/2018 Nuclear stress EF: 64%. Blood pressure demonstrated a hypertensive response to exercise. No T wave inversion was noted during stress. There was no ST segment deviation noted during stress. This is a low risk study. The study is normal.   Normal perfusion. LVEF 64% with normal wall motion. Fair exercise tolerance. This is a low risk study. No prior for comparison.   Heart Catheterization: None  LABORATORY DATA:    Latest Ref  Rng & Units 12/14/2021    7:03 PM 05/10/2021    8:19 AM 07/30/2011    1:14 PM  CBC  WBC 4.0 - 10.5 K/uL 11.1  6.6  7.3   Hemoglobin 12.0 - 15.0 g/dL 62.9  52.8  41.3   Hematocrit 36.0 - 46.0 % 42.4  44.5  32.5   Platelets 150 - 400 K/uL 336  326  339        Latest Ref Rng & Units 12/14/2021    7:03 PM 05/10/2021    8:19 AM 12/02/2018   10:06 AM  CMP  Glucose 70 - 99 mg/dL 244  96    BUN 8 - 23 mg/dL 10  10    Creatinine 0.10 - 1.00 mg/dL 2.72  5.36    Sodium 644 - 145 mmol/L 135  142    Potassium 3.5 - 5.1 mmol/L 3.1  4.0    Chloride 98 - 111 mmol/L 102  105    CO2 22 - 32 mmol/L 22  28    Calcium 8.9 - 10.3 mg/dL 8.9  9.5    Total Protein 6.5 - 8.1 g/dL 6.7  7.4    Total Bilirubin 0.3 - 1.2 mg/dL 0.7  0.8    Alkaline Phos 38 - 126 U/L 74  81    AST 15 - 41 U/L 28  21    ALT 0 - 44 U/L 20  19  22      Lipid Panel     Component Value Date/Time   CHOL 179 12/02/2018 1006   TRIG 202 (H) 12/02/2018 1006   HDL 46 12/02/2018 1006   CHOLHDL 3.9 12/02/2018 1006   LDLCALC 93 12/02/2018 1006   LABVLDL 40 12/02/2018 1006    No components found for: "NTPROBNP" No results for input(s): "PROBNP" in the last 8760 hours. No results for input(s): "TSH" in the last 8760 hours.  BMP No results for input(s): "NA", "K", "CL", "CO2", "GLUCOSE", "BUN", "CREATININE", "CALCIUM", "GFRNONAA", "GFRAA" in the last 8760 hours.  HEMOGLOBIN A1C No results found for: "HGBA1C", "MPG"  External Labs: Collected: February 13, 2023 provided by PCP. Hemoglobin 14.6, hematocrit 44.3%. BUN 14, creatinine 0.81. Sodium 140, potassium 4.3, chloride 104, bicarb 31 AST 22, ALT 25, alkaline phosphatase  72  Collected: December 25, 2022 Total cholesterol 176, triglycerides 130, HDL 47, LDL 106, non-HDL 129  IMPRESSION:    ICD-10-CM   1. Dyspnea on exertion  R06.09 PCV ECHOCARDIOGRAM COMPLETE    PCV CARDIAC STRESS TEST    CT CARDIAC SCORING (SELF PAY ONLY)    2. Dizziness  R42 EKG 12-Lead    3. Aortic  atherosclerosis (HCC)  I70.0 PCV ECHOCARDIOGRAM COMPLETE    PCV CARDIAC STRESS TEST    CT CARDIAC SCORING (SELF PAY ONLY)    4. Family history of arteriosclerotic cardiovascular disease  Z82.49     5. Mixed hyperlipidemia  E78.2        RECOMMENDATIONS: Ayviana Canto is a 72 y.o. Caucasian female whose past medical history and cardiac risk factors include: Hyperlipidemia, Aortic atherosclerosis,  recent COVID-19 infection, seasonal allergies, family history of heart disease, hx of breast cancer s/p lumpectomy and radiation.   Dyspnea on exertion Aortic atherosclerosis (HCC) Family history of arteriosclerotic cardiovascular disease EKG shows sinus rhythm without evidence of ischemia. Overall euvolemic not in congestive heart failure. Dyspnea multifactorial -recent COVID-19 infection, seasonal allergies, elevated blood pressures, etc. In addition, she has risk factors which include postmenopausal female, hyperlipidemia, aortic atherosclerosis and family history of heart disease.  Shared decision is to proceed with echo, exercise treadmill stress test and coronary calcium score. If the coronary calcium score is elevated we will transitioning her from GXT to exercise nuclear stress test. Reemphasized the importance of improving her modifiable cardiovascular risk factors.  Dizziness Overall improving. Orthostatic vital signs negative. Monitor for now.  Mixed hyperlipidemia Currently on atorvastatin.   She denies myalgia or other side effects. Most recent lipids dated February 2024, independently reviewed as noted above. Currently managed by primary care provider.  Data Reviewed: I have independently reviewed external notes provided by the referring provider as part of this office visit.   I have independently reviewed results of EKG, prior stress test results, as part of medical decision making. I have ordered the following tests:  Orders Placed This Encounter  Procedures   CT CARDIAC  SCORING (SELF PAY ONLY)    Standing Status:   Future    Standing Expiration Date:   03/03/2024    Order Specific Question:   Preferred imaging location?    Answer:   External   PCV CARDIAC STRESS TEST    Standing Status:   Future    Standing Expiration Date:   03/03/2024   EKG 12-Lead   PCV ECHOCARDIOGRAM COMPLETE    Standing Status:   Future    Standing Expiration Date:   03/03/2024   I have not made medications changes at today's encounter as noted above.  FINAL MEDICATION LIST END OF ENCOUNTER: No orders of the defined types were placed in this encounter.   Medications Discontinued During This Encounter  Medication Reason   meloxicam (MOBIC) 15 MG tablet Completed Course     Current Outpatient Medications:    acetaminophen (TYLENOL) 325 MG tablet, Take 650 mg by mouth every 6 (six) hours as needed., Disp: , Rfl:    atorvastatin (LIPITOR) 10 MG tablet, Take 10 mg by mouth daily., Disp: , Rfl:    B Complex-C (SUPER B COMPLEX PO), Take 1 tablet by mouth daily. , Disp: , Rfl:    betamethasone dipropionate (DIPROLENE) 0.05 % cream, Apply 1 application topically 2 (two) times daily as needed. , Disp: , Rfl:    Calcium-Magnesium-Vitamin D (CALCIUM 1200+D3 PO), Take 1 tablet by mouth daily. ,  Disp: , Rfl:    Cholecalciferol (VITAMIN D3) 50 MCG (2000 UT) capsule, Take 2,000 Units by mouth daily., Disp: , Rfl:    cycloSPORINE (RESTASIS) 0.05 % ophthalmic emulsion, 1 drop 2 (two) times daily., Disp: , Rfl:    fluticasone (FLONASE) 50 MCG/ACT nasal spray, Place into both nostrils daily., Disp: , Rfl:    ibuprofen (ADVIL) 200 MG tablet, Take 200 mg by mouth every 6 (six) hours as needed for mild pain., Disp: , Rfl:    LUTEIN PO, Take by mouth., Disp: , Rfl:    OMEPRAZOLE PO, Take 20 mg by mouth., Disp: , Rfl:    betamethasone 0.5% cream-vitamin a&d ointment 1:1 mixture, Apply topically daily., Disp: , Rfl:    Multiple Vitamins-Minerals (CENTRUM SILVER 50+WOMEN PO), Take 1 tablet by mouth daily.   (Patient not taking: Reported on 03/04/2023), Disp: , Rfl:    zinc gluconate 50 MG tablet, Take 50 mg by mouth daily., Disp: , Rfl:   Orders Placed This Encounter  Procedures   CT CARDIAC SCORING (SELF PAY ONLY)   PCV CARDIAC STRESS TEST   EKG 12-Lead   PCV ECHOCARDIOGRAM COMPLETE    There are no Patient Instructions on file for this visit.   --Continue cardiac medications as reconciled in final medication list. --Return in about 6 weeks (around 04/15/2023) for Follow up, Dyspnea, Review test results. or sooner if needed. --Continue follow-up with your primary care physician regarding the management of your other chronic comorbid conditions.  Patient's questions and concerns were addressed to her satisfaction. She voices understanding of the instructions provided during this encounter.   This note was created using a voice recognition software as a result there may be grammatical errors inadvertently enclosed that do not reflect the nature of this encounter. Every attempt is made to correct such errors.  Tessa Lerner, [place], [facility]  Pager:  [phone] Office: [phone]

## 2023-03-13 DIAGNOSIS — R599 Enlarged lymph nodes, unspecified: Secondary | ICD-10-CM | POA: Diagnosis not present

## 2023-03-15 ENCOUNTER — Encounter: Payer: Self-pay | Admitting: Hematology and Oncology

## 2023-03-15 ENCOUNTER — Ambulatory Visit
Admission: RE | Admit: 2023-03-15 | Discharge: 2023-03-15 | Disposition: A | Discharge status: Home / Self Care | Payer: Medicare Other | Source: Ambulatory Visit | Attending: Cardiology | Admitting: Cardiology

## 2023-03-15 DIAGNOSIS — I700 Atherosclerosis of aorta: Secondary | ICD-10-CM | POA: Insufficient documentation

## 2023-03-15 DIAGNOSIS — R0609 Other forms of dyspnea: Secondary | ICD-10-CM | POA: Insufficient documentation

## 2023-03-18 ENCOUNTER — Ambulatory Visit: Payer: Medicare Other | Attending: Surgery

## 2023-03-18 VITALS — Wt 195.5 lb

## 2023-03-18 DIAGNOSIS — Z483 Aftercare following surgery for neoplasm: Secondary | ICD-10-CM

## 2023-03-18 NOTE — Therapy (Signed)
OUTPATIENT PHYSICAL THERAPY SOZO SCREENING NOTE   Patient Name: Nichole Ritter MRN: [id number] DOB:1951-02-27, 72 y.o., female Today's Date: 03/18/2023  PCP: Laurann Montana, MD REFERRING PROVIDER: Harriette Bouillon, MD   PT End of Session - 03/18/23 1506     Visit Number 8   # unchanged due to screen only   PT Start Time 1504    PT Stop Time 1508    PT Time Calculation (min) 4 min    Activity Tolerance Patient tolerated treatment well    Behavior During Therapy WFL for tasks assessed/performed             Past Medical History:  Diagnosis Date   Allergic rhinitis    Aortic atherosclerosis (HCC)    Arthritis    Barrett esophagus    Barrett's esophagus    Bunion    Cancer (HCC)    right breast [facility]   Family history of breast cancer    Family history of leukemia    Family history of lung cancer    Family history of ovarian cancer    Family history of prostate cancer    Female cystocele    GERD (gastroesophageal reflux disease)    History of epistaxis 2012   Hyperlipidemia    UNSPECIFIED   Lichen sclerosus    Obesity    Osteopenia    Personal history of colonic polyps    Rectocele    Situational stress    Stress incontinence    Vitamin D deficiency    Past Surgical History:  Procedure Laterality Date   BREAST LUMPECTOMY     BREAST LUMPECTOMY WITH RADIOACTIVE SEED AND SENTINEL LYMPH NODE BIOPSY Right 06/15/2021   Procedure: RIGHT BREAST LUMPECTOMY WITH RADIOACTIVE SEED X 2 AND SENTINEL LYMPH NODE BIOPSY;  Surgeon: Harriette Bouillon, MD;  Location: [facility];  Service: General;  Laterality: Right;   CATARACT EXTRACTION Bilateral 02/2018   03/2018   COLONOSCOPY  01/2013   EVACUATION BREAST HEMATOMA Right 05/24/2022   Procedure: DRAINAGE CHRONIC RIGHT BREAST SEROMA;  Surgeon: Harriette Bouillon, MD;  Location: [facility];  Service: General;  Laterality: Right;   LUMBAR DISC SURGERY     TUBAL LIGATION     Patient Active Problem List    Diagnosis Date Noted   Genetic testing 05/20/2021   Family history of prostate cancer 05/10/2021   Family history of breast cancer 05/10/2021   Family history of ovarian cancer 05/10/2021   Family history of leukemia 05/10/2021   Family history of lung cancer 05/10/2021   Malignant neoplasm of upper-outer quadrant of right female breast (HCC) 05/05/2021   Aortic atherosclerosis (HCC) 01/06/2019   Mixed hyperlipidemia 01/06/2019   Family history of arteriosclerotic cardiovascular disease 01/06/2019    REFERRING DIAG: right breast cancer at risk for lymphedema  THERAPY DIAG: Aftercare following surgery for neoplasm  PERTINENT HISTORY: Patient was diagnosed on 02/21/2021 with right invasive lobular carcinoma breast cancer. She underwent a right lumpectomy and sentinel node biopsy (1 negative node) on 06/15/2021. 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   PAIN:  Are you having pain? No  SOZO SCREENING: Patient was assessed today using the SOZO machine to determine the lymphedema index score. This was compared to her baseline score. It was determined that she is within the recommended range when compared to her baseline and no further action is needed at  this time. She will continue SOZO screenings. These are done every 3 months for 2 years post operatively followed by every 6 months for 2 years, and then annually.   L-DEX FLOWSHEETS - 03/18/23 1500       L-DEX LYMPHEDEMA SCREENING   Measurement Type Unilateral    L-DEX MEASUREMENT EXTREMITY Upper Extremity    POSITION  Standing    DOMINANT SIDE Right    At Risk Side Right    BASELINE SCORE (UNILATERAL) 1.7    L-DEX SCORE (UNILATERAL) -0.5    VALUE CHANGE (UNILAT) -2.2              Hermenia Bers, PTA 03/18/2023, 3:07 PM

## 2023-03-22 ENCOUNTER — Ambulatory Visit: Payer: Medicare Other

## 2023-03-22 DIAGNOSIS — I700 Atherosclerosis of aorta: Secondary | ICD-10-CM

## 2023-03-22 DIAGNOSIS — R0609 Other forms of dyspnea: Secondary | ICD-10-CM | POA: Diagnosis not present

## 2023-04-02 NOTE — Progress Notes (Signed)
Called patient to inform her about her stress test results patient understood

## 2023-04-04 ENCOUNTER — Ambulatory Visit: Payer: Medicare Other

## 2023-04-04 DIAGNOSIS — R0609 Other forms of dyspnea: Secondary | ICD-10-CM

## 2023-04-04 DIAGNOSIS — I700 Atherosclerosis of aorta: Secondary | ICD-10-CM

## 2023-04-22 ENCOUNTER — Ambulatory Visit: Payer: Medicare Other | Admitting: Cardiology

## 2023-04-22 ENCOUNTER — Telehealth: Payer: Self-pay

## 2023-04-22 ENCOUNTER — Encounter: Payer: Self-pay | Admitting: Cardiology

## 2023-04-22 VITALS — BP 136/78 | HR 57 | Resp 16 | Ht 63.0 in | Wt 192.8 lb

## 2023-04-22 DIAGNOSIS — I2510 Atherosclerotic heart disease of native coronary artery without angina pectoris: Secondary | ICD-10-CM | POA: Diagnosis not present

## 2023-04-22 DIAGNOSIS — I10 Essential (primary) hypertension: Secondary | ICD-10-CM | POA: Diagnosis not present

## 2023-04-22 DIAGNOSIS — E782 Mixed hyperlipidemia: Secondary | ICD-10-CM

## 2023-04-22 DIAGNOSIS — I700 Atherosclerosis of aorta: Secondary | ICD-10-CM

## 2023-04-22 DIAGNOSIS — R0609 Other forms of dyspnea: Secondary | ICD-10-CM | POA: Diagnosis not present

## 2023-04-22 DIAGNOSIS — Z8249 Family history of ischemic heart disease and other diseases of the circulatory system: Secondary | ICD-10-CM | POA: Diagnosis not present

## 2023-04-22 DIAGNOSIS — I2584 Coronary atherosclerosis due to calcified coronary lesion: Secondary | ICD-10-CM | POA: Diagnosis not present

## 2023-04-22 MED ORDER — ATORVASTATIN CALCIUM 20 MG PO TABS: 20.0000 mg | ORAL_TABLET | Freq: Every day | ORAL | 3 refills | Status: DC

## 2023-04-22 MED ORDER — ASPIRIN 81 MG PO TBEC: 81.0000 mg | DELAYED_RELEASE_TABLET | Freq: Every day | ORAL | 12 refills | Status: AC

## 2023-04-22 MED ORDER — HYDROCHLOROTHIAZIDE 25 MG PO TABS: 25.0000 mg | ORAL_TABLET | Freq: Every morning | ORAL | 3 refills | Status: DC

## 2023-04-22 NOTE — Telephone Encounter (Signed)
Patient confirmed that she would start taking 81mg  aspirin.

## 2023-04-22 NOTE — Telephone Encounter (Signed)
Call patient left message to start taking 81mg  Asprin. Asked patient to call back to let me know that she received the message.

## 2023-04-22 NOTE — Progress Notes (Signed)
ID:  Nichole Ritter, DOB 09-04-1951, MRN [id number]  PCP:  Laurann Montana, MD  Cardiologist:  Tessa Lerner, DO, [facility] (established care 03/04/23) Former Cardiology Providers: Dr. Donato Schultz   Date: 04/22/23 Last Office Visit: 03/04/2023  Chief Complaint  Patient presents with   Dyspnea on exertion   Follow-up    HPI  Nichole Ritter is a 72 y.o. Caucasian female whose past medical history and cardiovascular risk factors include: Hyperlipidemia, Aortic atherosclerosis,  recent COVID-19 infection, seasonal allergies, family history of heart disease, hx of breast cancer s/p lumpectomy and radiation.   Patient was referred to the practice for evaluation of CAD as she was experiencing exertional dizziness in the recent past after her COVID-19 infection back in March 2024.  At the last office visit the shared decision was to proceed with echocardiography, coronary calcium score, and GXT.  She now presents for follow-up.  Her exertional dizziness has essentially resolved.  She denies anginal chest pain.  But dyspnea with exertion still present but well-controlled.  Office blood pressures within acceptable limits.  Home blood pressures are between 135-145 mmHg per patient.  FUNCTIONAL STATUS: No structured exercise program or daily routine.   ALLERGIES: Allergies  Allergen Reactions   Caine-1 [Lidocaine] Other (See Comments)    Itching and swelling locally-had to take steriods   Letrozole Other (See Comments)   Esomeprazole Magnesium Diarrhea   Formaldehyde Rash   Nickel Rash    MEDICATION LIST PRIOR TO VISIT: Current Meds  Medication Sig   acetaminophen (TYLENOL) 325 MG tablet Take 650 mg by mouth every 6 (six) hours as needed.   aspirin EC 81 MG tablet Take 1 tablet (81 mg total) by mouth daily. Swallow whole.   atorvastatin (LIPITOR) 20 MG tablet Take 1 tablet (20 mg total) by mouth at bedtime.   B Complex-C (SUPER B COMPLEX PO) Take 1 tablet by mouth daily.    betamethasone 0.5%  cream-vitamin a&d ointment 1:1 mixture Apply topically daily.   betamethasone dipropionate (DIPROLENE) 0.05 % cream Apply 1 application topically 2 (two) times daily as needed.    Calcium-Magnesium-Vitamin D (CALCIUM 1200+D3 PO) Take 1 tablet by mouth daily.    Cholecalciferol (VITAMIN D3) 50 MCG (2000 UT) capsule Take 2,000 Units by mouth daily.   cycloSPORINE (RESTASIS) 0.05 % ophthalmic emulsion 1 drop 2 (two) times daily.   fluticasone (FLONASE) 50 MCG/ACT nasal spray Place into both nostrils daily.   hydrochlorothiazide (HYDRODIURIL) 25 MG tablet Take 1 tablet (25 mg total) by mouth every morning.   ibuprofen (ADVIL) 200 MG tablet Take 200 mg by mouth every 6 (six) hours as needed for mild pain.   LUTEIN PO Take by mouth.   Multiple Vitamins-Minerals (CENTRUM SILVER 50+WOMEN PO) Take 1 tablet by mouth daily.   OMEPRAZOLE PO Take 20 mg by mouth.   [DISCONTINUED] atorvastatin (LIPITOR) 10 MG tablet Take 10 mg by mouth daily.     PAST MEDICAL HISTORY: Past Medical History:  Diagnosis Date   Allergic rhinitis    Aortic atherosclerosis (HCC)    Arthritis    Barrett esophagus    Barrett's esophagus    Bunion    Cancer (HCC)    right breast [facility]   Family history of breast cancer    Family history of leukemia    Family history of lung cancer    Family history of ovarian cancer    Family history of prostate cancer    Female cystocele    GERD (gastroesophageal reflux disease)  History of epistaxis 2012   Hyperlipidemia    UNSPECIFIED   Lichen sclerosus    Obesity    Osteopenia    Personal history of colonic polyps    Rectocele    Situational stress    Stress incontinence    Vitamin D deficiency     PAST SURGICAL HISTORY: Past Surgical History:  Procedure Laterality Date   BREAST LUMPECTOMY     BREAST LUMPECTOMY WITH RADIOACTIVE SEED AND SENTINEL LYMPH NODE BIOPSY Right 06/15/2021   Procedure: RIGHT BREAST LUMPECTOMY WITH RADIOACTIVE SEED X 2 AND SENTINEL LYMPH NODE  BIOPSY;  Surgeon: Harriette Bouillon, MD;  Location: [facility];  Service: General;  Laterality: Right;   CATARACT EXTRACTION Bilateral 02/2018   03/2018   COLONOSCOPY  01/2013   EVACUATION BREAST HEMATOMA Right 05/24/2022   Procedure: DRAINAGE CHRONIC RIGHT BREAST SEROMA;  Surgeon: Harriette Bouillon, MD;  Location: [facility];  Service: General;  Laterality: Right;   LUMBAR DISC SURGERY     TUBAL LIGATION      FAMILY HISTORY: The patient family history includes Breast cancer in her niece; Breast cancer (age of onset: 35) in her mother; Cancer in her cousin and maternal aunt; Diabetes in her mother; Heart attack in her paternal uncle; Hypertension in her mother; Leukemia (age of onset: 30) in her brother; Lung cancer (age of onset: 22) in her sister; Ovarian cancer in her niece; Polycythemia in her father; Prostate cancer in her nephew; Prostate cancer (age of onset: 40) in her brother; Stroke in her mother.  SOCIAL HISTORY:  The patient  reports that she has never smoked. She has never used smokeless tobacco. She reports current alcohol use. She reports that she does not use drugs.  REVIEW OF SYSTEMS: Review of Systems  Cardiovascular:  Positive for dyspnea on exertion (better, see HPI). Negative for chest pain, claudication, irregular heartbeat, leg swelling, near-syncope (none since last office visit), orthopnea, palpitations, paroxysmal nocturnal dyspnea and syncope.  Respiratory:  Negative for shortness of breath.   Hematologic/Lymphatic: Negative for bleeding problem.  Musculoskeletal:  Negative for muscle cramps and myalgias.  Neurological:  Negative for dizziness and light-headedness.    PHYSICAL EXAM:    04/22/2023   11:30 AM 03/18/2023    3:05 PM 03/04/2023   12:38 PM  Vitals with BMI  Height 5\' 3"   5\' 3"   Weight 192 lbs 13 oz 195 lbs 8 oz 200 lbs 3 oz  BMI 34.16 34.64 35.47  Systolic 136  139  Diastolic 78  62  Pulse 57  62   Physical Exam   Constitutional: No distress.  Age appropriate, hemodynamically stable.   Neck: No JVD present.  Cardiovascular: Normal rate, regular rhythm, S1 normal, S2 normal, intact distal pulses and normal pulses. Exam reveals no gallop, no S3 and no S4.  No murmur heard. Pulses:      Dorsalis pedis pulses are 2+ on the right side and 2+ on the left side.       Posterior tibial pulses are 2+ on the right side and 2+ on the left side.  Pulmonary/Chest: Effort normal and breath sounds normal. No stridor. She has no wheezes. She has no rales.  Abdominal: Soft. Bowel sounds are normal. She exhibits no distension. There is no abdominal tenderness.  Musculoskeletal:        General: No edema.     Cervical back: Neck supple.  Neurological: She is alert and oriented to person, place, and time. She has intact  cranial nerves (2-12).  Skin: Skin is warm and moist.   CARDIAC DATABASE: EKG: Mar 04, 2023: Sinus bradycardia, 56 bpm, low voltage in the precordial leads, consider inferior infarct, poor R wave progression, without underlying injury pattern.  Echocardiogram: 04/04/2023: Normal LV systolic function with visual EF 60-65%. Left ventricle cavity is normal in size. Normal left ventricular wall thickness. Normal global wall motion. Normal diastolic filling pattern, normal LAP. Calculated EF 63%. Mild (Grade I) mitral regurgitation. Mild tricuspid regurgitation. No evidence of pulmonary hypertension. No prior study for comparison.    Stress Testing: 10/06/2018 Nuclear stress EF: 64%. Blood pressure demonstrated a hypertensive response to exercise. No T wave inversion was noted during stress. There was no ST segment deviation noted during stress. This is a low risk study. The study is normal.   Normal perfusion. LVEF 64% with normal wall motion. Fair exercise tolerance. This is a low risk study. No prior for comparison.  Exercise treadmill stress test 03/25/2023: Exercise treadmill stress test  performed using Bruce protocol. Patient exercised for 6 minutes and 0 seconds, achieving 7.0 METS, and 87% of age predicted maximum heart rate. Exercise capacity was low. No chest pain reported. Normal heart rate and hemodynamic response. Stress EKG revealed no ischemic changes. Low risk study.   Heart Catheterization: None  CT Cardiac Scoring: 01/13/2023 Coronary arteries: Normal origin of left and right coronary arteries. Distribution of arterial calcifications if present, as noted below; LM 0 LAD 165 LCx 97.2 RCA 6.73 Total 269  IMPRESSION AND RECOMMENDATION: 1. Coronary calcium score of 269. This was 82nd percentile for age and sex matched control. 2. CAC 100-299 in LAD, LCx, RCA. CAC-DRS A2/N3. 3. Recommend aspirin and statin if no contraindication. 4. Continue heart healthy lifestyle and risk factor modification.  Noncardiac findings: No acute findings in the imaged extracardiac chest.  Aortic Atherosclerosis (ICD10-I70.0).   LABORATORY DATA:    Latest Ref Rng & Units 12/14/2021    7:03 PM 05/10/2021    8:19 AM 07/30/2011    1:14 PM  CBC  WBC 4.0 - 10.5 K/uL 11.1  6.6  7.3   Hemoglobin 12.0 - 15.0 g/dL 96.2  95.2  84.1   Hematocrit 36.0 - 46.0 % 42.4  44.5  32.5   Platelets 150 - 400 K/uL 336  326  339        Latest Ref Rng & Units 12/14/2021    7:03 PM 05/10/2021    8:19 AM 12/02/2018   10:06 AM  CMP  Glucose 70 - 99 mg/dL 324  96    BUN 8 - 23 mg/dL 10  10    Creatinine 4.01 - 1.00 mg/dL 0.27  2.53    Sodium 664 - 145 mmol/L 135  142    Potassium 3.5 - 5.1 mmol/L 3.1  4.0    Chloride 98 - 111 mmol/L 102  105    CO2 22 - 32 mmol/L 22  28    Calcium 8.9 - 10.3 mg/dL 8.9  9.5    Total Protein 6.5 - 8.1 g/dL 6.7  7.4    Total Bilirubin 0.3 - 1.2 mg/dL 0.7  0.8    Alkaline Phos 38 - 126 U/L 74  81    AST 15 - 41 U/L 28  21    ALT 0 - 44 U/L 20  19  22      Lipid Panel     Component Value Date/Time   CHOL 179 12/02/2018 1006   TRIG 202 (H) 12/02/2018 1006  HDL 46 12/02/2018 1006   CHOLHDL 3.9 12/02/2018 1006   LDLCALC 93 12/02/2018 1006   LABVLDL 40 12/02/2018 1006    No components found for: "NTPROBNP" No results for input(s): "PROBNP" in the last 8760 hours. No results for input(s): "TSH" in the last 8760 hours.  BMP No results for input(s): "NA", "K", "CL", "CO2", "GLUCOSE", "BUN", "CREATININE", "CALCIUM", "GFRNONAA", "GFRAA" in the last 8760 hours.  HEMOGLOBIN A1C No results found for: "HGBA1C", "MPG"  External Labs: Collected: February 13, 2023 provided by PCP. Hemoglobin 14.6, hematocrit 44.3%. BUN 14, creatinine 0.81. Sodium 140, potassium 4.3, chloride 104, bicarb 31 AST 22, ALT 25, alkaline phosphatase 72  Collected: December 25, 2022 Total cholesterol 176, triglycerides 130, HDL 47, LDL 106, non-HDL 129  IMPRESSION:    ICD-10-CM   1. Coronary atherosclerosis due to calcified coronary lesion  I25.10 atorvastatin (LIPITOR) 20 MG tablet   I25.84 aspirin EC 81 MG tablet    2. Aortic atherosclerosis (HCC)  I70.0 atorvastatin (LIPITOR) 20 MG tablet    aspirin EC 81 MG tablet    3. Benign hypertension  I10 hydrochlorothiazide (HYDRODIURIL) 25 MG tablet    Basic metabolic panel    4. Dyspnea on exertion  R06.09 hydrochlorothiazide (HYDRODIURIL) 25 MG tablet    Basic metabolic panel    5. Family history of arteriosclerotic cardiovascular disease  Z82.49     6. Mixed hyperlipidemia  E78.2        RECOMMENDATIONS: Dezyre Hoefer is a 71 y.o. Caucasian female whose past medical history and cardiac risk factors include: Moderate coronary calcification, hyperlipidemia, Aortic atherosclerosis,  recent COVID-19 infection, seasonal allergies, family history of heart disease, hx of breast cancer s/p lumpectomy and radiation.   Coronary atherosclerosis due to calcified coronary lesion Aortic atherosclerosis (HCC) Total CAC 269, 82 percentile Start aspirin 81 mg p.o. daily -will discuss at some points at the next visit. Uptitrate  Lipitor to 20 mg p.o. nightly. Echo and stress test results independently reviewed. Reemphasized importance of improving her modifiable cardiovascular risk factors for secondary prevention.  Benign hypertension Dyspnea on exertion Office and home blood pressure not well-controlled. Shared decision was to start hydrochlorothiazide 25 mg p.o. daily. Labs in 1 week to reevaluate kidney function and electrolytes Other contributing factors, recent COVID-19 infection, seasonal allergies. We have the importance of low-salt diet.  Mixed hyperlipidemia Currently on atorvastatin, dose increased as discussed above.   She denies myalgia or other side effects. Most recent lipids dated February 2024, independently reviewed as noted above. Will need repeat lipids after being on higher dose of Lipitor for 6 weeks.  Labs still need to be ordered and released.  FINAL MEDICATION LIST END OF ENCOUNTER: Meds ordered this encounter  Medications   atorvastatin (LIPITOR) 20 MG tablet    Sig: Take 1 tablet (20 mg total) by mouth at bedtime.    Dispense:  90 tablet    Refill:  3   hydrochlorothiazide (HYDRODIURIL) 25 MG tablet    Sig: Take 1 tablet (25 mg total) by mouth every morning.    Dispense:  30 tablet    Refill:  3   aspirin EC 81 MG tablet    Sig: Take 1 tablet (81 mg total) by mouth daily. Swallow whole.    Dispense:  30 tablet    Refill:  12    Medications Discontinued During This Encounter  Medication Reason   atorvastatin (LIPITOR) 10 MG tablet Dose change   zinc gluconate 50 MG tablet  Current Outpatient Medications:    acetaminophen (TYLENOL) 325 MG tablet, Take 650 mg by mouth every 6 (six) hours as needed., Disp: , Rfl:    aspirin EC 81 MG tablet, Take 1 tablet (81 mg total) by mouth daily. Swallow whole., Disp: 30 tablet, Rfl: 12   atorvastatin (LIPITOR) 20 MG tablet, Take 1 tablet (20 mg total) by mouth at bedtime., Disp: 90 tablet, Rfl: 3   B Complex-C (SUPER B COMPLEX PO),  Take 1 tablet by mouth daily. , Disp: , Rfl:    betamethasone 0.5% cream-vitamin a&d ointment 1:1 mixture, Apply topically daily., Disp: , Rfl:    betamethasone dipropionate (DIPROLENE) 0.05 % cream, Apply 1 application topically 2 (two) times daily as needed. , Disp: , Rfl:    Calcium-Magnesium-Vitamin D (CALCIUM 1200+D3 PO), Take 1 tablet by mouth daily. , Disp: , Rfl:    Cholecalciferol (VITAMIN D3) 50 MCG (2000 UT) capsule, Take 2,000 Units by mouth daily., Disp: , Rfl:    cycloSPORINE (RESTASIS) 0.05 % ophthalmic emulsion, 1 drop 2 (two) times daily., Disp: , Rfl:    fluticasone (FLONASE) 50 MCG/ACT nasal spray, Place into both nostrils daily., Disp: , Rfl:    hydrochlorothiazide (HYDRODIURIL) 25 MG tablet, Take 1 tablet (25 mg total) by mouth every morning., Disp: 30 tablet, Rfl: 3   ibuprofen (ADVIL) 200 MG tablet, Take 200 mg by mouth every 6 (six) hours as needed for mild pain., Disp: , Rfl:    LUTEIN PO, Take by mouth., Disp: , Rfl:    Multiple Vitamins-Minerals (CENTRUM SILVER 50+WOMEN PO), Take 1 tablet by mouth daily., Disp: , Rfl:    OMEPRAZOLE PO, Take 20 mg by mouth., Disp: , Rfl:   Orders Placed This Encounter  Procedures   Basic metabolic panel    There are no Patient Instructions on file for this visit.   --Continue cardiac medications as reconciled in final medication list. --Return in about 6 months (around 10/22/2023) for Follow up, Coronary artery calcification, Lipid. or sooner if needed. --Continue follow-up with your primary care physician regarding the management of your other chronic comorbid conditions.  Patient's questions and concerns were addressed to her satisfaction. She voices understanding of the instructions provided during this encounter.   This note was created using a voice recognition software as a result there may be grammatical errors inadvertently enclosed that do not reflect the nature of this encounter. Every attempt is made to correct such  errors.  Tessa Lerner, [place], [facility]  Pager:  [phone] Office: [phone]

## 2023-04-23 ENCOUNTER — Other Ambulatory Visit: Payer: Self-pay

## 2023-04-23 DIAGNOSIS — I10 Essential (primary) hypertension: Secondary | ICD-10-CM

## 2023-04-23 DIAGNOSIS — R0609 Other forms of dyspnea: Secondary | ICD-10-CM

## 2023-04-26 DIAGNOSIS — H538 Other visual disturbances: Secondary | ICD-10-CM | POA: Diagnosis not present

## 2023-04-26 DIAGNOSIS — M3501 Sicca syndrome with keratoconjunctivitis: Secondary | ICD-10-CM | POA: Diagnosis not present

## 2023-04-26 DIAGNOSIS — H26491 Other secondary cataract, right eye: Secondary | ICD-10-CM | POA: Diagnosis not present

## 2023-04-30 DIAGNOSIS — R0609 Other forms of dyspnea: Secondary | ICD-10-CM | POA: Diagnosis not present

## 2023-04-30 DIAGNOSIS — I10 Essential (primary) hypertension: Secondary | ICD-10-CM | POA: Diagnosis not present

## 2023-05-01 LAB — BASIC METABOLIC PANEL
BUN/Creatinine Ratio: 14 (ref 12–28)
BUN: 11 mg/dL (ref 8–27)
CO2: 23 mmol/L (ref 20–29)
Calcium: 9.4 mg/dL (ref 8.7–10.3)
Chloride: 101 mmol/L (ref 96–106)
Creatinine, Ser: 0.76 mg/dL (ref 0.57–1.00)
Glucose: 88 mg/dL (ref 70–99)
Potassium: 4.1 mmol/L (ref 3.5–5.2)
Sodium: 138 mmol/L (ref 134–144)
eGFR: 83 mL/min/{1.73_m2} (ref >59)

## 2023-05-08 ENCOUNTER — Other Ambulatory Visit: Payer: Self-pay

## 2023-05-08 DIAGNOSIS — I10 Essential (primary) hypertension: Secondary | ICD-10-CM

## 2023-05-08 DIAGNOSIS — R0609 Other forms of dyspnea: Secondary | ICD-10-CM

## 2023-05-08 DIAGNOSIS — I2510 Atherosclerotic heart disease of native coronary artery without angina pectoris: Secondary | ICD-10-CM

## 2023-05-08 DIAGNOSIS — I700 Atherosclerosis of aorta: Secondary | ICD-10-CM

## 2023-05-08 MED ORDER — HYDROCHLOROTHIAZIDE 25 MG PO TABS
25.0000 mg | ORAL_TABLET | Freq: Every morning | ORAL | 6 refills | Status: DC
Start: 2023-05-08 — End: 2023-12-06

## 2023-05-08 MED ORDER — ATORVASTATIN CALCIUM 20 MG PO TABS: 20.0000 mg | ORAL_TABLET | Freq: Every day | ORAL | 3 refills | Status: DC

## 2023-05-08 NOTE — Progress Notes (Signed)
Called and spoke with patient regarding her recent labs.

## 2023-06-04 ENCOUNTER — Other Ambulatory Visit: Payer: Self-pay

## 2023-06-04 DIAGNOSIS — I2510 Atherosclerotic heart disease of native coronary artery without angina pectoris: Secondary | ICD-10-CM

## 2023-06-04 NOTE — Progress Notes (Signed)
Called and spoke to patient she stated she is doing fine with medication and will go next Monday to get her labs done, orders have been placed and released

## 2023-06-10 DIAGNOSIS — I2510 Atherosclerotic heart disease of native coronary artery without angina pectoris: Secondary | ICD-10-CM | POA: Diagnosis not present

## 2023-06-10 DIAGNOSIS — I2584 Coronary atherosclerosis due to calcified coronary lesion: Secondary | ICD-10-CM | POA: Diagnosis not present

## 2023-06-17 NOTE — Progress Notes (Signed)
Called patient and LVM.

## 2023-06-18 NOTE — Progress Notes (Signed)
Called patient to inform her about her lab results. Patient understood

## 2023-06-24 ENCOUNTER — Ambulatory Visit: Payer: Medicare Other | Attending: Surgery | Admitting: Physical Therapy

## 2023-06-24 ENCOUNTER — Encounter: Payer: Self-pay | Admitting: Physical Therapy

## 2023-06-24 DIAGNOSIS — Z483 Aftercare following surgery for neoplasm: Secondary | ICD-10-CM | POA: Insufficient documentation

## 2023-06-24 NOTE — Therapy (Signed)
OUTPATIENT PHYSICAL THERAPY SOZO SCREENING NOTE   Patient Name: Nichole Ritter MRN: [id number] DOB:[birth year], 72 y.o., female Today's Date: 06/24/2023  PCP: Laurann Montana, MD REFERRING PROVIDER: Harriette Bouillon, MD   PT End of Session - 06/24/23 1510     Visit Number 8    PT Start Time 1511    PT Stop Time 1520    PT Time Calculation (min) 9 min    Activity Tolerance Patient tolerated treatment well    Behavior During Therapy WFL for tasks assessed/performed             Past Medical History:  Diagnosis Date   Allergic rhinitis    Aortic atherosclerosis (HCC)    Arthritis    Barrett esophagus    Barrett's esophagus    Bunion    Cancer (HCC)    right breast [facility]   Family history of breast cancer    Family history of leukemia    Family history of lung cancer    Family history of ovarian cancer    Family history of prostate cancer    Female cystocele    GERD (gastroesophageal reflux disease)    History of epistaxis 2012   Hyperlipidemia    UNSPECIFIED   Lichen sclerosus    Obesity    Osteopenia    Personal history of colonic polyps    Rectocele    Situational stress    Stress incontinence    Vitamin D deficiency    Past Surgical History:  Procedure Laterality Date   BREAST LUMPECTOMY     BREAST LUMPECTOMY WITH RADIOACTIVE SEED AND SENTINEL LYMPH NODE BIOPSY Right 06/15/2021   Procedure: RIGHT BREAST LUMPECTOMY WITH RADIOACTIVE SEED X 2 AND SENTINEL LYMPH NODE BIOPSY;  Surgeon: Harriette Bouillon, MD;  Location: [facility];  Service: General;  Laterality: Right;   CATARACT EXTRACTION Bilateral 02/2018   03/2018   COLONOSCOPY  01/2013   EVACUATION BREAST HEMATOMA Right 05/24/2022   Procedure: DRAINAGE CHRONIC RIGHT BREAST SEROMA;  Surgeon: Harriette Bouillon, MD;  Location: [facility];  Service: General;  Laterality: Right;   LUMBAR DISC SURGERY     TUBAL LIGATION     Patient Active Problem List   Diagnosis Date Noted   Genetic testing  05/20/2021   Family history of prostate cancer 05/10/2021   Family history of breast cancer 05/10/2021   Family history of ovarian cancer 05/10/2021   Family history of leukemia 05/10/2021   Family history of lung cancer 05/10/2021   Malignant neoplasm of upper-outer quadrant of right female breast (HCC) 05/05/2021   Aortic atherosclerosis (HCC) 01/06/2019   Mixed hyperlipidemia 01/06/2019   Family history of arteriosclerotic cardiovascular disease 01/06/2019    REFERRING DIAG: right breast cancer at risk for lymphedema  THERAPY DIAG:  Aftercare following surgery for neoplasm  PERTINENT HISTORY: Patient was diagnosed on 02/21/2021 with right invasive lobular carcinoma breast cancer. She underwent a right lumpectomy and sentinel node biopsy (1 negative node) on 06/15/2021. 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 No  SOZO SCREENING: Patient was assessed today using the SOZO machine to determine the lymphedema index score. This was compared to her baseline score. It was determined that she is within the recommended range when compared to her baseline and no further action is needed at this time. She will continue SOZO screenings. These are done every  3 months for 2 years post operatively followed by every 6 months for 2 years, and then annually.   L-DEX FLOWSHEETS - 06/24/23 1500       L-DEX LYMPHEDEMA SCREENING   Measurement Type Unilateral    L-DEX MEASUREMENT EXTREMITY Upper Extremity    POSITION  Standing    DOMINANT SIDE Right    At Risk Side Right    BASELINE SCORE (UNILATERAL) 1.7    L-DEX SCORE (UNILATERAL) 0.5    VALUE CHANGE (UNILAT) -1.2             Bethann Punches, [place] 06/24/23 3:43 PM

## 2023-08-30 ENCOUNTER — Inpatient Hospital Stay: Payer: Medicare Other | Attending: Hematology and Oncology | Admitting: Hematology and Oncology

## 2023-08-30 VITALS — BP 128/60 | HR 62 | Temp 97.5°F | Resp 16 | Ht 63.0 in | Wt 195.2 lb

## 2023-08-30 DIAGNOSIS — Z923 Personal history of irradiation: Secondary | ICD-10-CM | POA: Diagnosis not present

## 2023-08-30 DIAGNOSIS — R531 Weakness: Secondary | ICD-10-CM | POA: Insufficient documentation

## 2023-08-30 DIAGNOSIS — Z170 Estrogen receptor positive status [ER+]: Secondary | ICD-10-CM | POA: Diagnosis not present

## 2023-08-30 DIAGNOSIS — C50411 Malignant neoplasm of upper-outer quadrant of right female breast: Secondary | ICD-10-CM | POA: Diagnosis not present

## 2023-08-30 DIAGNOSIS — R42 Dizziness and giddiness: Secondary | ICD-10-CM | POA: Diagnosis not present

## 2023-08-30 DIAGNOSIS — Z79899 Other long term (current) drug therapy: Secondary | ICD-10-CM | POA: Insufficient documentation

## 2023-08-30 DIAGNOSIS — Z8616 Personal history of COVID-19: Secondary | ICD-10-CM | POA: Insufficient documentation

## 2023-08-30 DIAGNOSIS — Z1721 Progesterone receptor positive status: Secondary | ICD-10-CM | POA: Diagnosis not present

## 2023-08-30 NOTE — Assessment & Plan Note (Signed)
06/15/2021: Right superior lumpectomy: High-grade DCIS with necrosis, LCIS, margins negative Right lateral lumpectomy: Grade 2 invasive lobular carcinoma, 1.3 cm, margins negative, focal ADH, ALH, 0/1 lymph node negative, ER 80%, PR 60%, Ki-67 1%, HER2 negative 1+ Oncotype DX score: 19: Distant recurrence rate at 9 years: 6% Adjuvant radiation: Completed 08/21/2021   Treatment plan: Unable to tolerate letrozole, has opted to forego antiestrogen therapy. Breast infection 03/30/2022: Treated with antibiotics   Breast cancer surveillance: 1.  Breast exam 08/30/2023: Benign 2. mammogram 03/13/2023 at [facility]: Benign breast density category C  Bone density 03/13/2022: T score -1.5: Osteopenia: Calcium and vitamin D   Return to clinic in 1 year for follow-up

## 2023-08-30 NOTE — Progress Notes (Signed)
Patient Care Team: Laurann Montana, MD as PCP - General (Family Medicine) Jake Bathe, MD as PCP - Cardiology (Cardiology) Harriette Bouillon, MD as Consulting Physician (General Surgery) Serena Croissant, MD as Consulting Physician (Hematology and Oncology) Dorothy Puffer, MD as Consulting Physician (Radiation Oncology)  DIAGNOSIS:  Encounter Diagnosis  Name Primary?   Malignant neoplasm of upper-outer quadrant of right breast in female, estrogen receptor positive (HCC) Yes    SUMMARY OF ONCOLOGIC HISTORY: Oncology History  Malignant neoplasm of upper-outer quadrant of right female breast (HCC)  04/24/2021 Initial Diagnosis   Screening mammogram detected architectural distortion 9:00 measuring 0.9 cm biopsy revealed invasive mammary cancer with lobular features prognostic panel pending, right breast calcifications at 12:00 1.2 cm, biopsy showed DCIS with LCIS high-grade with necrosis and calcifications ER greater than 95%, PR 40%   05/10/2021 Cancer Staging   Staging form: Breast, AJCC 8th Edition - Clinical stage from 05/10/2021: cT1b, cN0, cM0, G1, ER+, PR+, HER2: Unknown - Signed by Serena Croissant, MD on 05/10/2021 Stage prefix: Initial diagnosis Histologic grading system: 3 grade system Laterality: Right Staged by: Pathologist and managing physician Stage used in treatment planning: Yes National guidelines used in treatment planning: Yes Type of national guideline used in treatment planning: NCCN   05/20/2021 Genetic Testing   Negative genetic testing:  No pathogenic variants detected on the [employer] BRCAplus panel (report date 05/20/2021) or the CancerNext-Expanded + RNAinsight panel (report date 05/22/2021). 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  The BRCAplus panel offered by [employer] and includes sequencing and deletion/duplication analysis for the following 8 genes: ATM, BRCA1, BRCA2, CDH1, CHEK2, PALB2, PTEN, and TP53. The  CancerNext-Expanded + RNAinsight gene panel offered by [employer] and includes sequencing and rearrangement analysis for the following 77 genes: AIP, ALK, APC, ATM, AXIN2, BAP1, BARD1, BLM, BMPR1A, BRCA1, BRCA2, BRIP1, CDC73, CDH1, CDK4, CDKN1B, CDKN2A, CHEK2, CTNNA1, DICER1, FANCC, FH, FLCN, GALNT12, KIF1B, LZTR1, MAX, MEN1, MET, MLH1, MSH2, MSH3, MSH6, MUTYH, NBN, NF1, NF2, NTHL1, PALB2, PHOX2B, PMS2, POT1, PRKAR1A, PTCH1, PTEN, RAD51C, RAD51D, RB1, RECQL, RET, SDHA, SDHAF2, SDHB, SDHC, SDHD, SMAD4, SMARCA4, SMARCB1, SMARCE1, STK11, SUFU, TMEM127, TP53, TSC1, TSC2, VHL and XRCC2 (sequencing and deletion/duplication); EGFR, EGLN1, HOXB13, KIT, MITF, PDGFRA, POLD1 and POLE (sequencing only); EPCAM and GREM1 (deletion/duplication only). RNA data is routinely analyzed for use in variant interpretation for all genes.   06/15/2021 Definitive Surgery   FINAL MICROSCOPIC DIAGNOSIS:   A. BREAST, RIGHT SUPERIOR, LUMPECTOMY:  - Ductal carcinoma in situ, high-grade with necrosis and calcifications  - Lobular carcinoma in situ  - No evidence of invasive carcinoma  - Resection margins are negative for DCIS  - Biopsy site changes  - See oncology table   B. BREAST, RIGHT LATERAL, LUMPECTOMY:  - Invasive lobular carcinoma, 1.3 cm, grade 2, see comment  - Resection margins are negative for carcinoma  - Focal atypical ductal hyperplasia  - Atypical lobular hyperplasia  - Fibrocystic change with calcifications  - Biopsy site changes   C. BREAST, RIGHT LATERAL ADDITIONAL ANTEROMEDIAL MARGIN, EXCISION:  - Benign breast parenchyma, negative for carcinoma   D. BREAST, RIGHT LATERAL ADDITIONAL INFERIOR/LATERAL MARGIN, EXCISION:  - Benign breast parenchyma, negative for carcinoma   E. BREAST, RIGHT LATERAL ADDITIONAL POSTERIOR/SUPERIOR MARGIN, EXCISION:  - Benign breast parenchyma, negative for carcinoma   F. LYMPH NODE, RIGHT AXILLARY, SENTINEL, EXCISION:  - Lymph node, negative for carcinoma (0/1), see  comment   COMMENT:  B and F.  Immunohistochemical stain for E-cadherin is negative in the  tumor cells, consistent with a lobular phenotype.  Immunostain for  cytokeratin AE1/AE3 performed on part F is negative for metastatic  carcinoma.   ADDENDUM:  B5.  PROGNOSTIC INDICATOR RESULTS:  The tumor cells are NEGATIVE for Her2 (1+).  Estrogen Receptor: POSITIVE, 80% MODERATE STAINING INTENSITY  Progesterone Receptor: POSITIVE, 60% MODERATE STAINING INTENSITY  Proliferation Marker Ki-67: 1%    06/30/2021 Oncotype testing   Oncotype DX: Recurrence score: 19, distant recurrence at 9 years: 6%   07/26/2021 - 08/21/2021 Radiation Therapy   Adjuvant radiation   09/12/2021 -  Anti-estrogen oral therapy   Adjuvant letrozole     CHIEF COMPLIANT: Surveillance of breast cancer  History of Present Illness   The patient, with a history of benign breast disease and atherosclerosis, presents for a follow-up after a recent COVID-19 infection. During her recovery, she experienced unusual weakness and dizziness, prompting a referral to a cardiologist. The cardiologist diagnosed two leaky heart valves. The patient is unsure if these cardiac issues predated the COVID-19 infection or were a result of it. She reports feeling exhausted after minimal physical activity, such as walking up and down the stairs to her garage multiple times. Despite these symptoms, the patient feels that her overall health is okay.  The patient has been keeping up with her mammograms, with the most recent one in May. She is due for another diagnostic mammogram, which has been showing good results. The patient's breasts are dense, but not extremely so. She has a history of benign breast disease and had surgery years ago. The patient is currently being monitored for a small spot on the left breast, which may be related to scar tissue from the previous surgery.  The patient's cholesterol medication, Lipitor, was increased to 20mg  a few  years ago after the discovery of plaque in her aorta. She has not reported any side effects from this medication.         ALLERGIES:  is allergic to caine-1 [lidocaine], letrozole, esomeprazole magnesium, formaldehyde, and nickel.  MEDICATIONS:  Current Outpatient Medications  Medication Sig Dispense Refill   aspirin EC 81 MG tablet Take 1 tablet (81 mg total) by mouth daily. Swallow whole. 30 tablet 12   B Complex-C (SUPER B COMPLEX PO) Take 1 tablet by mouth daily.      betamethasone 0.5% cream-vitamin a&d ointment 1:1 mixture Apply topically daily.     betamethasone dipropionate (DIPROLENE) 0.05 % cream Apply 1 application topically 2 (two) times daily as needed.      Calcium-Magnesium-Vitamin D (CALCIUM 1200+D3 PO) Take 1 tablet by mouth daily.      Cholecalciferol (VITAMIN D3) 50 MCG (2000 UT) capsule Take 2,000 Units by mouth daily.     cycloSPORINE (RESTASIS) 0.05 % ophthalmic emulsion 1 drop 2 (two) times daily.     fluticasone (FLONASE) 50 MCG/ACT nasal spray Place into both nostrils daily.     ibuprofen (ADVIL) 200 MG tablet Take 200 mg by mouth every 6 (six) hours as needed for mild pain.     LUTEIN PO Take by mouth.     Multiple Vitamins-Minerals (CENTRUM SILVER 50+WOMEN PO) Take 1 tablet by mouth daily.     OMEPRAZOLE PO Take 20 mg by mouth.     atorvastatin (LIPITOR) 20 MG tablet Take 1 tablet (20 mg total) by mouth at bedtime. 90 tablet 3   hydrochlorothiazide (HYDRODIURIL) 25 MG tablet Take 1 tablet (25 mg total) by mouth every morning. 30  tablet 6   No current facility-administered medications for this visit.    PHYSICAL EXAMINATION: ECOG PERFORMANCE STATUS: 1 - Symptomatic but completely ambulatory  Vitals:   08/30/23 1019  BP: 128/60  Pulse: 62  Resp: 16  Temp: (!) 97.5 F (36.4 C)  SpO2: 97%   Filed Weights   08/30/23 1019  Weight: 195 lb 3.2 oz (88.5 kg)    Physical Exam   BREAST: Breasts are dense, classified as category C, not extremely dense. No  abnormalities noted during examination.      (exam performed in the presence of a chaperone)  LABORATORY DATA:  I have reviewed the data as listed    Latest Ref Rng & Units 06/10/2023    9:41 AM 06/10/2023    9:27 AM 04/30/2023    9:42 AM  CMP  Glucose 70 - 99 mg/dL   88   BUN 8 - 27 mg/dL   11   Creatinine 4.09 - 1.00 mg/dL   8.11   Sodium 914 - 782 mmol/L   138   Potassium 3.5 - 5.2 mmol/L   4.1   Chloride 96 - 106 mmol/L   101   CO2 20 - 29 mmol/L   23   Calcium 8.7 - 10.3 mg/dL   9.4   AST 0 - 40 IU/L 24     ALT 0 - 32 IU/L  24      Lab Results  Component Value Date   WBC 11.1 (H) 12/14/2021   HGB 14.6 12/14/2021   HCT 42.4 12/14/2021   MCV 92.2 12/14/2021   PLT 336 12/14/2021   NEUTROABS 3.4 05/10/2021    ASSESSMENT & PLAN:  Malignant neoplasm of upper-outer quadrant of right female breast (HCC) 06/15/2021: Right superior lumpectomy: High-grade DCIS with necrosis, LCIS, margins negative Right lateral lumpectomy: Grade 2 invasive lobular carcinoma, 1.3 cm, margins negative, focal ADH, ALH, 0/1 lymph node negative, ER 80%, PR 60%, Ki-67 1%, HER2 negative 1+ Oncotype DX score: 19: Distant recurrence rate at 9 years: 6% Adjuvant radiation: Completed 08/21/2021   Treatment plan: Unable to tolerate letrozole, has opted to forego antiestrogen therapy. Breast infection 03/30/2022: Treated with antibiotics   Breast cancer surveillance: 1.  Breast exam 08/30/2023: Benign 2. mammogram 03/13/2023 at [facility]: Benign breast density category C  Bone density 03/13/2022: T score -1.5: Osteopenia: Calcium and vitamin D  Cardiac Valvular Disease Recent diagnosis of two leaky heart valves post-COVID infection. Symptoms include weakness and dizziness, particularly with exertion. Managed by a cardiologist. -No changes to current management plan discussed.  Return to clinic in 1 year for follow-up    No orders of the defined types were placed in this encounter.  The patient has a good  understanding of the overall plan. she agrees with it. she will call with any problems that may develop before the next visit here. Total time spent: 30 mins including face to face time and time spent for planning, charting and co-ordination of care   Tamsen Meek, MD 08/30/23

## 2023-10-03 DIAGNOSIS — E785 Hyperlipidemia, unspecified: Secondary | ICD-10-CM | POA: Diagnosis not present

## 2023-10-03 DIAGNOSIS — Z23 Encounter for immunization: Secondary | ICD-10-CM | POA: Diagnosis not present

## 2023-10-03 DIAGNOSIS — Z0000 Encounter for general adult medical examination without abnormal findings: Secondary | ICD-10-CM | POA: Diagnosis not present

## 2023-10-03 DIAGNOSIS — L900 Lichen sclerosus et atrophicus: Secondary | ICD-10-CM | POA: Diagnosis not present

## 2023-10-03 DIAGNOSIS — I10 Essential (primary) hypertension: Secondary | ICD-10-CM | POA: Diagnosis not present

## 2023-10-03 DIAGNOSIS — K219 Gastro-esophageal reflux disease without esophagitis: Secondary | ICD-10-CM | POA: Diagnosis not present

## 2023-10-03 DIAGNOSIS — E559 Vitamin D deficiency, unspecified: Secondary | ICD-10-CM | POA: Diagnosis not present

## 2023-10-03 DIAGNOSIS — Z79899 Other long term (current) drug therapy: Secondary | ICD-10-CM | POA: Diagnosis not present

## 2023-10-03 DIAGNOSIS — Z853 Personal history of malignant neoplasm of breast: Secondary | ICD-10-CM | POA: Diagnosis not present

## 2023-10-03 DIAGNOSIS — J309 Allergic rhinitis, unspecified: Secondary | ICD-10-CM | POA: Diagnosis not present

## 2023-10-03 DIAGNOSIS — M8580 Other specified disorders of bone density and structure, unspecified site: Secondary | ICD-10-CM | POA: Diagnosis not present

## 2023-10-03 DIAGNOSIS — I700 Atherosclerosis of aorta: Secondary | ICD-10-CM | POA: Diagnosis not present

## 2023-10-03 DIAGNOSIS — R7303 Prediabetes: Secondary | ICD-10-CM | POA: Diagnosis not present

## 2023-10-14 ENCOUNTER — Ambulatory Visit: Payer: Medicare Other | Admitting: Cardiology

## 2023-10-28 ENCOUNTER — Ambulatory Visit: Payer: Medicare Other | Attending: Cardiology | Admitting: Cardiology

## 2023-10-28 ENCOUNTER — Encounter: Payer: Self-pay | Admitting: Cardiology

## 2023-10-28 VITALS — BP 136/84 | HR 65 | Resp 16 | Ht 63.0 in | Wt 200.2 lb

## 2023-10-28 DIAGNOSIS — I10 Essential (primary) hypertension: Secondary | ICD-10-CM | POA: Diagnosis not present

## 2023-10-28 DIAGNOSIS — I700 Atherosclerosis of aorta: Secondary | ICD-10-CM | POA: Diagnosis not present

## 2023-10-28 DIAGNOSIS — I2584 Coronary atherosclerosis due to calcified coronary lesion: Secondary | ICD-10-CM | POA: Diagnosis not present

## 2023-10-28 DIAGNOSIS — I2510 Atherosclerotic heart disease of native coronary artery without angina pectoris: Secondary | ICD-10-CM

## 2023-10-28 DIAGNOSIS — E782 Mixed hyperlipidemia: Secondary | ICD-10-CM

## 2023-10-28 NOTE — Patient Instructions (Signed)

## 2023-10-28 NOTE — Progress Notes (Signed)
Cardiology Office Note:  .   Date:  10/28/2023  ID:  Nichole Ritter, DOB August 29, 1951, MRN [id number] PCP:  Laurann Montana, MD  Former Cardiology Providers: Dr. Donato Schultz [facility] Providers Cardiologist:  Tessa Lerner, DO , [facility] (established care May 2024) Electrophysiologist:  None  Click to update primary MD,subspecialty MD or APP then REFRESH:1}    Chief Complaint  Patient presents with   Coronary atherosclerosis due to calcified coronary lesion   Follow-up    History of Present Illness: .   Nichole Ritter is a 72 y.o. Caucasian female whose past medical history and cardiovascular risk factors includes: Hyperlipidemia, Aortic atherosclerosis, recent COVID-19 infection, seasonal allergies, family history of heart disease, hx of breast cancer s/p lumpectomy and radiation.   Patient presents today for 80-month follow-up visit given her history of moderate coronary artery calcification.  At the last office visit she was started on aspirin 81 mg p.o. daily and her lipid-lowering agents were uptitrated as well.  She had repeat labs on 06/10/2023 which notes improvement in LDL, direct LDL 85 mg/dL  Since last office visit, denies anginal chest pain or heart failure symptoms.  Recently has had URI symptoms and has been on over-the-counter medications which may be contributing to exam elevated blood pressures in the recent past.  She has been also indulging in unhealthy foods during this holiday season this may be contributing to her overall lipid profile.  Review of Systems: .   Review of Systems  Cardiovascular:  Negative for chest pain, claudication, irregular heartbeat, leg swelling, near-syncope, orthopnea, palpitations, paroxysmal nocturnal dyspnea and syncope.  Respiratory:  Negative for shortness of breath.   Hematologic/Lymphatic: Negative for bleeding problem.    Studies Reviewed:   EKG: EKG Interpretation Date/Time:  Monday October 28 2023 10:30:42 EST Ventricular Rate:   58 PR Interval:  180 QRS Duration:  72 QT Interval:  440 QTC Calculation: 431 R Axis:   24  Text Interpretation: Sinus bradycardia Anterior infarct (cited on or before 14-Dec-2021) When compared with ECG of 14-Dec-2021 18:25, No significant change was found Confirmed by Tessa Lerner [phone] on 10/28/2023 11:12:00 AM  Echocardiogram: 04/04/2023: Normal LV systolic function with visual EF 60-65%. Left ventricle cavity is normal in size. Normal left ventricular wall thickness. Normal global wall motion. Normal diastolic filling pattern, normal LAP. Calculated EF 63%. Mild (Grade I) mitral regurgitation. Mild tricuspid regurgitation. No evidence of pulmonary hypertension. No prior study for comparison.    Stress Testing: 10/06/2018 Nuclear stress EF: 64%. Blood pressure demonstrated a hypertensive response to exercise. No T wave inversion was noted during stress. There was no ST segment deviation noted during stress. This is a low risk study. The study is normal.   Normal perfusion. LVEF 64% with normal wall motion. Fair exercise tolerance. This is a low risk study. No prior for comparison.   Exercise treadmill stress test 03/25/2023: Exercise treadmill stress test performed using Bruce protocol. Patient exercised for 6 minutes and 0 seconds, achieving 7.0 METS, and 87% of age predicted maximum heart rate. Exercise capacity was low. No chest pain reported. Normal heart rate and hemodynamic response. Stress EKG revealed no ischemic changes. Low risk study.    Heart Catheterization: None   CT Cardiac Scoring: 01/13/2023 Coronary arteries: Normal origin of left and right coronary arteries. Distribution of arterial calcifications if present, as noted below; LM 0 LAD 165 LCx 97.2 RCA 6.73 Total 269  IMPRESSION AND RECOMMENDATION: 1. Coronary calcium score of 269. This was 82nd percentile  for age and sex matched control. 2. CAC 100-299 in LAD, LCx, RCA. CAC-DRS A2/N3. 3. Recommend  aspirin and statin if no contraindication. 4. Continue heart healthy lifestyle and risk factor modification.  Noncardiac findings: No acute findings in the imaged extracardiac chest.  Aortic Atherosclerosis (ICD10-I70.0).   RADIOLOGY: NA  Risk Assessment/Calculations:   NA   Labs:       Latest Ref Rng & Units 12/14/2021    7:03 PM 05/10/2021    8:19 AM 07/30/2011    1:14 PM  CBC  WBC 4.0 - 10.5 K/uL 11.1  6.6  7.3   Hemoglobin 12.0 - 15.0 g/dL 16.1  09.6  04.5   Hematocrit 36.0 - 46.0 % 42.4  44.5  32.5   Platelets 150 - 400 K/uL 336  326  339        Latest Ref Rng & Units 04/30/2023    9:42 AM 12/14/2021    7:03 PM 05/10/2021    8:19 AM  BMP  Glucose 70 - 99 mg/dL 88  409  96   BUN 8 - 27 mg/dL 11  10  10    Creatinine 0.57 - 1.00 mg/dL 8.11  9.14  7.82   BUN/Creat Ratio 12 - 28 14     Sodium 134 - 144 mmol/L 138  135  142   Potassium 3.5 - 5.2 mmol/L 4.1  3.1  4.0   Chloride 96 - 106 mmol/L 101  102  105   CO2 20 - 29 mmol/L 23  22  28    Calcium 8.7 - 10.3 mg/dL 9.4  8.9  9.5       Latest Ref Rng & Units 06/10/2023    9:41 AM 06/10/2023    9:27 AM 04/30/2023    9:42 AM  CMP  Glucose 70 - 99 mg/dL   88   BUN 8 - 27 mg/dL   11   Creatinine 9.56 - 1.00 mg/dL   2.13   Sodium 086 - 578 mmol/L   138   Potassium 3.5 - 5.2 mmol/L   4.1   Chloride 96 - 106 mmol/L   101   CO2 20 - 29 mmol/L   23   Calcium 8.7 - 10.3 mg/dL   9.4   AST 0 - 40 IU/L 24     ALT 0 - 32 IU/L  24      Lab Results  Component Value Date   CHOL 155 06/10/2023   HDL 47 06/10/2023   LDLCALC 84 06/10/2023   LDLDIRECT 85 06/10/2023   TRIG 135 06/10/2023   CHOLHDL 3.9 12/02/2018   No results for input(s): "LIPOA" in the last 8760 hours. No components found for: "NTPROBNP" No results for input(s): "PROBNP" in the last 8760 hours. No results for input(s): "TSH" in the last 8760 hours.  External Labs Collected: October 03, 2023 [facility] database. Total cholesterol 158, HDL 43, LDL 91, triglycerides  130. A1c 6.0. Hemoglobin 15.4. Potassium 3.8, magnesium 2.0. TSH 1.9  Physical Exam:    Today's Vitals   10/28/23 1026  BP: 136/84  Pulse: 65  Resp: 16  SpO2: 96%  Weight: 200 lb 3.2 oz (90.8 kg)  Height: 5\' 3"  (1.6 m)   Body mass index is 35.46 kg/m. Wt Readings from Last 3 Encounters:  10/28/23 200 lb 3.2 oz (90.8 kg)  08/30/23 195 lb 3.2 oz (88.5 kg)  04/22/23 192 lb 12.8 oz (87.5 kg)    Physical Exam  Constitutional: No distress.  Age appropriate,  hemodynamically stable.   Neck: No JVD present.  Cardiovascular: Normal rate, regular rhythm, S1 normal, S2 normal, intact distal pulses and normal pulses. Exam reveals no gallop, no S3 and no S4.  No murmur heard. Pulses:      Dorsalis pedis pulses are 2+ on the right side and 2+ on the left side.       Posterior tibial pulses are 2+ on the right side and 2+ on the left side.  Pulmonary/Chest: Effort normal and breath sounds normal. No stridor. She has no wheezes. She has no rales.  Abdominal: Soft. Bowel sounds are normal. She exhibits no distension. There is no abdominal tenderness.  Musculoskeletal:        General: No edema.     Cervical back: Neck supple.  Neurological: She is alert and oriented to person, place, and time. She has intact cranial nerves (2-12).  Skin: Skin is warm and moist.     Impression & Recommendation(s):  Impression:   ICD-10-CM   1. Coronary atherosclerosis due to calcified coronary lesion  I25.10 EKG 12-Lead   I25.84     2. Aortic atherosclerosis (HCC)  I70.0     3. Benign hypertension  I10     4. Mixed hyperlipidemia  E78.2        Recommendation(s):  Coronary atherosclerosis due to calcified coronary lesion Aortic atherosclerosis (HCC) Coronary calcium score of 269. This was 82nd percentile for age and sex matched control.  Continue aspirin 81 mg p.o. daily. Continue lipid-lowering agents-Lipitor 20 mg p.o. nightly. Prior echo and stress test reviewed as part of medical  decision making at today's office visit. Reemphasized the importance of secondary prevention with focus on improving her modifiable cardiovascular risk factors such as glycemic control, lipid management, blood pressure control, weight loss.  Benign hypertension Office blood pressures are within acceptable limits. Continue hydrochlorothiazide 25 mg p.o. every morning. Current numbers may be elevated secondary to her URI over-the-counter medications.  Have asked her to keep a log of her blood pressures at home to see if further medication titration is warranted. Reemphasized importance of low-salt diet.  Mixed hyperlipidemia Currently on Lipitor 20 mg p.o. daily.   She denies myalgia or other side effects. Most recent lipids dated December 2024, independently reviewed as noted above. LDL levels pending likely secondary to dietary indiscretion. Patient will have her lipids rechecked with PCP if they continue to be elevated with medical therapy.  Orders Placed:  Orders Placed This Encounter  Procedures   EKG 12-Lead   Final Medication List:   No orders of the defined types were placed in this encounter.   There are no discontinued medications.   Current Outpatient Medications:    aspirin EC 81 MG tablet, Take 1 tablet (81 mg total) by mouth daily. Swallow whole., Disp: 30 tablet, Rfl: 12   atorvastatin (LIPITOR) 20 MG tablet, Take 1 tablet (20 mg total) by mouth at bedtime., Disp: 90 tablet, Rfl: 3   B Complex-C (SUPER B COMPLEX PO), Take 1 tablet by mouth daily. , Disp: , Rfl:    betamethasone 0.5% cream-vitamin a&d ointment 1:1 mixture, Apply topically daily., Disp: , Rfl:    betamethasone dipropionate (DIPROLENE) 0.05 % cream, Apply 1 application topically 2 (two) times daily as needed. , Disp: , Rfl:    Calcium-Magnesium-Vitamin D (CALCIUM 1200+D3 PO), Take 1 tablet by mouth daily. , Disp: , Rfl:    Cholecalciferol (VITAMIN D3) 50 MCG (2000 UT) capsule, Take 2,000 Units by mouth  daily., Disp: ,  Rfl:    cycloSPORINE (RESTASIS) 0.05 % ophthalmic emulsion, 1 drop 2 (two) times daily., Disp: , Rfl:    fluticasone (FLONASE) 50 MCG/ACT nasal spray, Place into both nostrils daily., Disp: , Rfl:    hydrochlorothiazide (HYDRODIURIL) 25 MG tablet, Take 1 tablet (25 mg total) by mouth every morning., Disp: 30 tablet, Rfl: 6   ibuprofen (ADVIL) 200 MG tablet, Take 200 mg by mouth every 6 (six) hours as needed for mild pain., Disp: , Rfl:    LUTEIN PO, Take by mouth., Disp: , Rfl:    Multiple Vitamins-Minerals (CENTRUM SILVER 50+WOMEN PO), Take 1 tablet by mouth daily., Disp: , Rfl:    OMEPRAZOLE PO, Take 20 mg by mouth., Disp: , Rfl:   Consent:   N/A  Disposition:   1 year follow-up sooner if needed  Her questions and concerns were addressed to her satisfaction. She voices understanding of the recommendations provided during this encounter.    Signed, Tessa Lerner, DO, [facility]   [facility]  [address] [place], [place] [address] 10/28/2023 6:00 PM

## 2023-11-21 DIAGNOSIS — K5730 Diverticulosis of large intestine without perforation or abscess without bleeding: Secondary | ICD-10-CM | POA: Diagnosis not present

## 2023-11-21 DIAGNOSIS — Z1211 Encounter for screening for malignant neoplasm of colon: Secondary | ICD-10-CM | POA: Diagnosis not present

## 2023-12-06 ENCOUNTER — Other Ambulatory Visit: Payer: Self-pay | Admitting: Cardiology

## 2023-12-06 DIAGNOSIS — R0609 Other forms of dyspnea: Secondary | ICD-10-CM

## 2023-12-06 DIAGNOSIS — I10 Essential (primary) hypertension: Secondary | ICD-10-CM

## 2023-12-17 DIAGNOSIS — E785 Hyperlipidemia, unspecified: Secondary | ICD-10-CM | POA: Diagnosis not present

## 2023-12-19 NOTE — Therapy (Incomplete)
  OUTPATIENT PHYSICAL THERAPY SOZO SCREENING NOTE   Patient Name: Nichole Ritter MRN: [id number] DOB:Mar 19, 1951, 73 y.o., female Today's Date: 12/19/2023  PCP: Laurann Montana, MD REFERRING PROVIDER: Harriette Bouillon, MD     Past Medical History:  Diagnosis Date   Allergic rhinitis    Aortic atherosclerosis (HCC)    Arthritis    Barrett esophagus    Barrett's esophagus    Bunion    Cancer (HCC)    right breast [facility]   Family history of breast cancer    Family history of leukemia    Family history of lung cancer    Family history of ovarian cancer    Family history of prostate cancer    Female cystocele    GERD (gastroesophageal reflux disease)    History of epistaxis 2012   Hyperlipidemia    UNSPECIFIED   Lichen sclerosus    Obesity    Osteopenia    Personal history of colonic polyps    Rectocele    Situational stress    Stress incontinence    Vitamin D deficiency    Past Surgical History:  Procedure Laterality Date   BREAST LUMPECTOMY     BREAST LUMPECTOMY WITH RADIOACTIVE SEED AND SENTINEL LYMPH NODE BIOPSY Right 06/15/2021   Procedure: RIGHT BREAST LUMPECTOMY WITH RADIOACTIVE SEED X 2 AND SENTINEL LYMPH NODE BIOPSY;  Surgeon: Harriette Bouillon, MD;  Location: [facility];  Service: General;  Laterality: Right;   CATARACT EXTRACTION Bilateral 02/2018   03/2018   COLONOSCOPY  01/2013   EVACUATION BREAST HEMATOMA Right 05/24/2022   Procedure: DRAINAGE CHRONIC RIGHT BREAST SEROMA;  Surgeon: Harriette Bouillon, MD;  Location: [facility];  Service: General;  Laterality: Right;   LUMBAR DISC SURGERY     TUBAL LIGATION     Patient Active Problem List   Diagnosis Date Noted   Genetic testing 05/20/2021   Family history of prostate cancer 05/10/2021   Family history of breast cancer 05/10/2021   Family history of ovarian cancer 05/10/2021   Family history of leukemia 05/10/2021   Family history of lung cancer 05/10/2021   Malignant neoplasm of  upper-outer quadrant of right female breast (HCC) 05/05/2021   Aortic atherosclerosis (HCC) 01/06/2019   Mixed hyperlipidemia 01/06/2019   Family history of arteriosclerotic cardiovascular disease 01/06/2019    REFERRING DIAG: right breast cancer at risk for lymphedema  THERAPY DIAG:  No diagnosis found.  PERTINENT HISTORY: Patient was diagnosed on 02/21/2021 with right invasive lobular carcinoma breast cancer. She underwent a right lumpectomy and sentinel node biopsy (1 negative node) on 06/15/2021. 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 No  SOZO SCREENING: Patient was assessed today using the SOZO machine to determine the lymphedema index score. This was compared to her baseline score. It was determined that she is within the recommended range when compared to her baseline and no further action is needed at this time. She will continue SOZO screenings. These are done every 3 months for 2 years post operatively followed by every 6 months for 2 years, and then annually.     Bethann Punches, [place] 12/19/23 3:50 PM

## 2023-12-23 ENCOUNTER — Ambulatory Visit: Payer: Medicare Other | Admitting: Physical Therapy

## 2024-02-27 DIAGNOSIS — Z853 Personal history of malignant neoplasm of breast: Secondary | ICD-10-CM | POA: Diagnosis not present

## 2024-03-05 DIAGNOSIS — Z923 Personal history of irradiation: Secondary | ICD-10-CM | POA: Diagnosis not present

## 2024-03-05 DIAGNOSIS — Z853 Personal history of malignant neoplasm of breast: Secondary | ICD-10-CM | POA: Diagnosis not present

## 2024-03-05 DIAGNOSIS — N6489 Other specified disorders of breast: Secondary | ICD-10-CM | POA: Diagnosis not present

## 2024-03-16 DIAGNOSIS — Z853 Personal history of malignant neoplasm of breast: Secondary | ICD-10-CM | POA: Diagnosis not present

## 2024-03-16 DIAGNOSIS — Z08 Encounter for follow-up examination after completed treatment for malignant neoplasm: Secondary | ICD-10-CM | POA: Diagnosis not present

## 2024-04-27 DIAGNOSIS — E785 Hyperlipidemia, unspecified: Secondary | ICD-10-CM | POA: Diagnosis not present

## 2024-04-27 DIAGNOSIS — Z853 Personal history of malignant neoplasm of breast: Secondary | ICD-10-CM | POA: Diagnosis not present

## 2024-04-27 DIAGNOSIS — I10 Essential (primary) hypertension: Secondary | ICD-10-CM | POA: Diagnosis not present

## 2024-05-15 DIAGNOSIS — H26491 Other secondary cataract, right eye: Secondary | ICD-10-CM | POA: Diagnosis not present

## 2024-05-15 DIAGNOSIS — H43813 Vitreous degeneration, bilateral: Secondary | ICD-10-CM | POA: Diagnosis not present

## 2024-05-15 DIAGNOSIS — M3501 Sicca syndrome with keratoconjunctivitis: Secondary | ICD-10-CM | POA: Diagnosis not present

## 2024-05-28 DIAGNOSIS — Z853 Personal history of malignant neoplasm of breast: Secondary | ICD-10-CM | POA: Diagnosis not present

## 2024-05-28 DIAGNOSIS — I10 Essential (primary) hypertension: Secondary | ICD-10-CM | POA: Diagnosis not present

## 2024-05-28 DIAGNOSIS — E785 Hyperlipidemia, unspecified: Secondary | ICD-10-CM | POA: Diagnosis not present

## 2024-06-28 DIAGNOSIS — Z853 Personal history of malignant neoplasm of breast: Secondary | ICD-10-CM | POA: Diagnosis not present

## 2024-06-28 DIAGNOSIS — I10 Essential (primary) hypertension: Secondary | ICD-10-CM | POA: Diagnosis not present

## 2024-06-28 DIAGNOSIS — E785 Hyperlipidemia, unspecified: Secondary | ICD-10-CM | POA: Diagnosis not present

## 2024-07-28 ENCOUNTER — Other Ambulatory Visit: Payer: Self-pay | Admitting: Cardiology

## 2024-07-28 DIAGNOSIS — Z853 Personal history of malignant neoplasm of breast: Secondary | ICD-10-CM | POA: Diagnosis not present

## 2024-07-28 DIAGNOSIS — I10 Essential (primary) hypertension: Secondary | ICD-10-CM | POA: Diagnosis not present

## 2024-07-28 DIAGNOSIS — E785 Hyperlipidemia, unspecified: Secondary | ICD-10-CM | POA: Diagnosis not present

## 2024-07-28 DIAGNOSIS — I700 Atherosclerosis of aorta: Secondary | ICD-10-CM

## 2024-07-28 DIAGNOSIS — I2510 Atherosclerotic heart disease of native coronary artery without angina pectoris: Secondary | ICD-10-CM

## 2024-09-01 ENCOUNTER — Inpatient Hospital Stay: Payer: Medicare Other | Attending: Hematology and Oncology | Admitting: Hematology and Oncology

## 2024-09-01 VITALS — BP 129/81 | HR 59 | Temp 98.1°F | Resp 18 | Wt 184.1 lb

## 2024-09-01 DIAGNOSIS — Z79811 Long term (current) use of aromatase inhibitors: Secondary | ICD-10-CM | POA: Diagnosis not present

## 2024-09-01 DIAGNOSIS — C50411 Malignant neoplasm of upper-outer quadrant of right female breast: Secondary | ICD-10-CM | POA: Insufficient documentation

## 2024-09-01 DIAGNOSIS — Z7982 Long term (current) use of aspirin: Secondary | ICD-10-CM | POA: Insufficient documentation

## 2024-09-01 DIAGNOSIS — Z1732 Human epidermal growth factor receptor 2 negative status: Secondary | ICD-10-CM | POA: Insufficient documentation

## 2024-09-01 DIAGNOSIS — Z923 Personal history of irradiation: Secondary | ICD-10-CM | POA: Insufficient documentation

## 2024-09-01 DIAGNOSIS — Z1721 Progesterone receptor positive status: Secondary | ICD-10-CM | POA: Diagnosis not present

## 2024-09-01 DIAGNOSIS — L905 Scar conditions and fibrosis of skin: Secondary | ICD-10-CM | POA: Insufficient documentation

## 2024-09-01 DIAGNOSIS — Z79899 Other long term (current) drug therapy: Secondary | ICD-10-CM | POA: Diagnosis not present

## 2024-09-01 DIAGNOSIS — I7090 Unspecified atherosclerosis: Secondary | ICD-10-CM | POA: Insufficient documentation

## 2024-09-01 DIAGNOSIS — Z170 Estrogen receptor positive status [ER+]: Secondary | ICD-10-CM | POA: Insufficient documentation

## 2024-09-01 NOTE — Assessment & Plan Note (Addendum)
 06/15/2021: Right superior lumpectomy: High-grade DCIS with necrosis, LCIS, margins negative Right lateral lumpectomy: Grade 2 invasive lobular carcinoma, 1.3 cm, margins negative, focal ADH, ALH, 0/1 lymph node negative, ER 80%, PR 60%, Ki-67 1%, HER2 negative 1+ Oncotype DX score: 19: Distant recurrence rate at 9 years: 6% Adjuvant radiation: Completed 08/21/2021   Treatment plan: Unable to tolerate letrozole , has opted to forego antiestrogen therapy. Breast infection 03/30/2022: Treated with antibiotics   Breast cancer surveillance: 1.  Breast exam 09/01/2024: Benign 2. mammogram 03/13/2023 at 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 Managed by a cardiologist.   Patient saw Dr.Thimappa and wants to see Dr.Pace at a later time to even out her breasts.  Return to clinic in 1 year for follow-up

## 2024-09-01 NOTE — Progress Notes (Signed)
 Patient Care Team: Teresa Channel, MD as PCP - General (Family Medicine) Michele Richardson, DO as PCP - Cardiology (Cardiology) Vanderbilt Ned, MD as Consulting Physician (General Surgery) Odean Potts, MD as Consulting Physician (Hematology and Oncology) Dewey Rush, MD as Consulting Physician (Radiation Oncology)  DIAGNOSIS:  Encounter Diagnosis  Name Primary?   Malignant neoplasm of upper-outer quadrant of right breast in female, estrogen receptor positive (HCC) Yes    SUMMARY OF ONCOLOGIC HISTORY: Oncology History  Malignant neoplasm of upper-outer quadrant of right female breast (HCC)  04/24/2021 Initial Diagnosis   Screening mammogram detected architectural distortion 9:00 measuring 0.9 cm biopsy revealed invasive mammary cancer with lobular features prognostic panel pending, right breast calcifications at 12:00 1.2 cm, biopsy showed DCIS with LCIS high-grade with necrosis and calcifications ER greater than 95%, PR 40%   05/10/2021 Cancer Staging   Staging form: Breast, AJCC 8th Edition - Clinical stage from 05/10/2021: cT1b, cN0, cM0, G1, ER+, PR+, HER2: Unknown - Signed by Odean Potts, MD on 05/10/2021 Stage prefix: Initial diagnosis Histologic grading system: 3 grade system Laterality: Right Staged by: Pathologist and managing physician Stage used in treatment planning: Yes National guidelines used in treatment planning: Yes Type of national guideline used in treatment planning: NCCN   05/20/2021 Genetic Testing   Negative genetic testing:  No pathogenic variants detected on the [employer] BRCAplus panel (report date 05/20/2021) or the CancerNext-Expanded + RNAinsight panel (report date 05/22/2021). 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  The BRCAplus panel offered by [employer] and includes sequencing and deletion/duplication analysis for the following 8 genes: ATM, BRCA1, BRCA2, CDH1, CHEK2, PALB2, PTEN, and TP53. The  CancerNext-Expanded + RNAinsight gene panel offered by [employer] and includes sequencing and rearrangement analysis for the following 77 genes: AIP, ALK, APC, ATM, AXIN2, BAP1, BARD1, BLM, BMPR1A, BRCA1, BRCA2, BRIP1, CDC73, CDH1, CDK4, CDKN1B, CDKN2A, CHEK2, CTNNA1, DICER1, FANCC, FH, FLCN, GALNT12, KIF1B, LZTR1, MAX, MEN1, MET, MLH1, MSH2, MSH3, MSH6, MUTYH, NBN, NF1, NF2, NTHL1, PALB2, PHOX2B, PMS2, POT1, PRKAR1A, PTCH1, PTEN, RAD51C, RAD51D, RB1, RECQL, RET, SDHA, SDHAF2, SDHB, SDHC, SDHD, SMAD4, SMARCA4, SMARCB1, SMARCE1, STK11, SUFU, TMEM127, TP53, TSC1, TSC2, VHL and XRCC2 (sequencing and deletion/duplication); EGFR, EGLN1, HOXB13, KIT, MITF, PDGFRA, POLD1 and POLE (sequencing only); EPCAM and GREM1 (deletion/duplication only). RNA data is routinely analyzed for use in variant interpretation for all genes.   06/15/2021 Definitive Surgery   FINAL MICROSCOPIC DIAGNOSIS:   A. BREAST, RIGHT SUPERIOR, LUMPECTOMY:  - Ductal carcinoma in situ, high-grade with necrosis and calcifications  - Lobular carcinoma in situ  - No evidence of invasive carcinoma  - Resection margins are negative for DCIS  - Biopsy site changes  - See oncology table   B. BREAST, RIGHT LATERAL, LUMPECTOMY:  - Invasive lobular carcinoma, 1.3 cm, grade 2, see comment  - Resection margins are negative for carcinoma  - Focal atypical ductal hyperplasia  - Atypical lobular hyperplasia  - Fibrocystic change with calcifications  - Biopsy site changes   C. BREAST, RIGHT LATERAL ADDITIONAL ANTEROMEDIAL MARGIN, EXCISION:  - Benign breast parenchyma, negative for carcinoma   D. BREAST, RIGHT LATERAL ADDITIONAL INFERIOR/LATERAL MARGIN, EXCISION:  - Benign breast parenchyma, negative for carcinoma   E. BREAST, RIGHT LATERAL ADDITIONAL POSTERIOR/SUPERIOR MARGIN, EXCISION:  - Benign breast parenchyma, negative for carcinoma   F. LYMPH NODE, RIGHT AXILLARY, SENTINEL, EXCISION:  - Lymph node, negative for carcinoma (0/1), see  comment   COMMENT:  B and F.  Immunohistochemical stain for E-cadherin is negative in the  tumor cells, consistent with a lobular phenotype.  Immunostain for  cytokeratin AE1/AE3 performed on part F is negative for metastatic  carcinoma.   ADDENDUM:  B5.  PROGNOSTIC INDICATOR RESULTS:  The tumor cells are NEGATIVE for Her2 (1+).  Estrogen Receptor: POSITIVE, 80% MODERATE STAINING INTENSITY  Progesterone Receptor: POSITIVE, 60% MODERATE STAINING INTENSITY  Proliferation Marker Ki-67: 1%    06/30/2021 Oncotype testing   Oncotype DX: Recurrence score: 19, distant recurrence at 9 years: 6%   07/26/2021 - 08/21/2021 Radiation Therapy   Adjuvant radiation   09/12/2021 -  Anti-estrogen oral therapy   Adjuvant letrozole      CHIEF COMPLIANT: Follow-up on letrozole  therapy  HISTORY OF PRESENT ILLNESS:  History of Present Illness Nichole Ritter is a 73 year old female with a history of breast cancer who presents for a follow-up visit.  She has not been taking letrozole , which she previously tried for about two to three months. A mammogram was performed in May 2025, ordered by a surgeon, possibly Dr. Lizabeth. She has a history of a procedure on her left breast in 1986 or 1987 to remove scar tissue, which has been noted in previous mammograms.  She experiences fatigue and an inability to work as many hours as before, attributing this to her heart condition involving leaky valves. She feels restless when lying down due to back issues and sometimes feels 'a little funny' when lying on her left side, though it is not painful. She is currently on medication for her heart condition, but no further interventions have been decided upon.  She has considered plastic surgery to address asymmetry in her breasts, attributed to previous medical procedures. She consulted with a plastic surgeon in the spring of 2025 but decided against surgery at that time. She desires her bra to make her breasts appear  symmetrical, noting that the weight of her breasts causes them to appear uneven over time. A front-closure bra provides the best support.     ALLERGIES:  is allergic to caine-1 [lidocaine ], letrozole , esomeprazole magnesium, formaldehyde, and nickel.  MEDICATIONS:  Current Outpatient Medications  Medication Sig Dispense Refill   aspirin  EC 81 MG tablet Take 1 tablet (81 mg total) by mouth daily. Swallow whole. 30 tablet 12   atorvastatin  (LIPITOR) 20 MG tablet TAKE 1 TABLET BY MOUTH EVERYDAY AT BEDTIME 90 tablet 0   B Complex-C (SUPER B COMPLEX PO) Take 1 tablet by mouth daily.      betamethasone 0.5% cream-vitamin a&d ointment 1:1 mixture Apply topically daily.     betamethasone dipropionate (DIPROLENE) 0.05 % cream Apply 1 application topically 2 (two) times daily as needed.      Calcium -Magnesium-Vitamin D (CALCIUM  1200+D3 PO) Take 1 tablet by mouth daily.      Cholecalciferol (VITAMIN D3) 50 MCG (2000 UT) capsule Take 2,000 Units by mouth daily.     cycloSPORINE (RESTASIS) 0.05 % ophthalmic emulsion 1 drop 2 (two) times daily.     hydrochlorothiazide  (HYDRODIURIL ) 25 MG tablet Take 1 tablet (25 mg total) by mouth in the morning. 90 tablet 3   ibuprofen  (ADVIL ) 200 MG tablet Take 200 mg by mouth every 6 (six) hours as needed for mild pain.     LUTEIN PO Take by mouth.     Multiple Vitamins-Minerals (CENTRUM SILVER 50+WOMEN PO) Take 1 tablet by mouth daily.     OMEPRAZOLE PO Take 20 mg by mouth.     fluticasone (FLONASE) 50 MCG/ACT nasal  spray Place into both nostrils daily. (Patient not taking: Reported on 09/01/2024)     No current facility-administered medications for this visit.    PHYSICAL EXAMINATION: ECOG PERFORMANCE STATUS: 1 - Symptomatic but completely ambulatory  Vitals:   09/01/24 1002  BP: 129/81  Pulse: (!) 59  Resp: 18  Temp: 98.1 F (36.7 C)  SpO2: 98%   Filed Weights   09/01/24 1002  Weight: 184 lb 2 oz (83.5 kg)    Physical Exam   (exam performed in  the presence of a chaperone)  LABORATORY DATA:  I have reviewed the data as listed    Latest Ref Rng & Units 06/10/2023    9:41 AM 06/10/2023    9:27 AM 04/30/2023    9:42 AM  CMP  Glucose 70 - 99 mg/dL   88   BUN 8 - 27 mg/dL   11   Creatinine 9.42 - 1.00 mg/dL   9.23   Sodium 865 - 855 mmol/L   138   Potassium 3.5 - 5.2 mmol/L   4.1   Chloride 96 - 106 mmol/L   101   CO2 20 - 29 mmol/L   23   Calcium  8.7 - 10.3 mg/dL   9.4   AST 0 - 40 IU/L 24     ALT 0 - 32 IU/L  24      Lab Results  Component Value Date   WBC 11.1 (H) 12/14/2021   HGB 14.6 12/14/2021   HCT 42.4 12/14/2021   MCV 92.2 12/14/2021   PLT 336 12/14/2021   NEUTROABS 3.4 05/10/2021    ASSESSMENT & PLAN:  Malignant neoplasm of upper-outer quadrant of right female breast (HCC) 06/15/2021: Right superior lumpectomy: High-grade DCIS with necrosis, LCIS, margins negative Right lateral lumpectomy: Grade 2 invasive lobular carcinoma, 1.3 cm, margins negative, focal ADH, ALH, 0/1 lymph node negative, ER 80%, PR 60%, Ki-67 1%, HER2 negative 1+ Oncotype DX score: 19: Distant recurrence rate at 9 years: 6% Adjuvant radiation: Completed 08/21/2021   Treatment plan: Unable to tolerate letrozole , has opted to forego antiestrogen therapy. Breast infection 03/30/2022: Treated with antibiotics   Breast cancer surveillance: 1.  Breast exam 09/01/2024: Benign 2. 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 Managed by a cardiologist.   Return to clinic in 1 year for follow-up ------------------------------------- Assessment and Plan Assessment & Plan ER-positive right breast cancer, status post treatment, surveillance Declined methotrexate. Considering plastic surgery for cosmetic reasons. - Retrieve mammogram report from [place]. - Discuss potential  referral to a different plastic surgeon if desired.  Benign breast disease Scar tissue on left side, non-concerning.  Atherosclerosis and cardiac valvular disease Atherosclerosis with two leaky valves, managed with medication. Experiences occasional fatigue and restlessness.      No orders of the defined types were placed in this encounter.  The patient has a good understanding of the overall plan. she agrees with it. she will call with any problems that may develop before the next visit here.  I personally spent a total of 30 minutes in the care of the patient today including preparing to see the patient, getting/reviewing separately obtained history, performing a medically appropriate exam/evaluation, counseling and educating, placing orders, referring and communicating with other health care professionals, documenting clinical information in the EHR, independently interpreting results, communicating results, and coordinating care.   Naomi  MARLA Chad, MD 09/01/24

## 2024-10-13 ENCOUNTER — Ambulatory Visit

## 2024-10-13 DIAGNOSIS — M2042 Other hammer toe(s) (acquired), left foot: Secondary | ICD-10-CM | POA: Diagnosis not present

## 2024-10-13 DIAGNOSIS — D3612 Benign neoplasm of peripheral nerves and autonomic nervous system, upper limb, including shoulder: Secondary | ICD-10-CM

## 2024-10-13 DIAGNOSIS — D3610 Benign neoplasm of peripheral nerves and autonomic nervous system, unspecified: Secondary | ICD-10-CM | POA: Diagnosis not present

## 2024-10-13 DIAGNOSIS — M7989 Other specified soft tissue disorders: Secondary | ICD-10-CM | POA: Diagnosis not present

## 2024-10-13 DIAGNOSIS — M2012 Hallux valgus (acquired), left foot: Secondary | ICD-10-CM | POA: Diagnosis not present

## 2024-10-14 NOTE — Progress Notes (Signed)
 Subjective:  Patient ID: Nichole Ritter, female    DOB: 04/12/1951,  MRN: [id number]  Chief Complaint  Patient presents with   Foot Pain    Rm11 Left foot bunion pain for several months and cyst left foot/ hurts with pressure and standing.    73 y.o. female presents with the above complaint.  She has had a hallux abducto valgus deformity for an extended period of time.  Over the last couple months she has noticed that the area has become more problematic.  She relates to pain with compression from shoes despite trying multiple pairs.  She is also curious about a second hammertoe deformity and a bump on the top of her foot.  All left.  Review of Systems: Negative except as noted in the HPI. Denies N/V/F/Ch.  Past Medical History:  Diagnosis Date   Allergic rhinitis    Aortic atherosclerosis    Arthritis    Barrett esophagus    Barrett's esophagus    Bunion    Cancer (HCC)    right breast [facility]   Family history of breast cancer    Family history of leukemia    Family history of lung cancer    Family history of ovarian cancer    Family history of prostate cancer    Female cystocele    GERD (gastroesophageal reflux disease)    History of epistaxis 2012   Hyperlipidemia    UNSPECIFIED   Lichen sclerosus    Obesity    Osteopenia    Personal history of colonic polyps    Rectocele    Situational stress    Stress incontinence    Vitamin D deficiency    Current Medications[1]  Tobacco Use History[2]  Allergies[3] Objective:  There were no vitals filed for this visit. There is no height or weight on file to calculate BMI. Constitutional Well developed. Well nourished. Oriented to person, place, and time.  Vascular Dorsalis pedis pulses palpable bilaterally. Posterior tibial pulses palpable bilaterally. Capillary refill normal to all digits.  No cyanosis or clubbing noted. Pedal hair growth normal.  Neurologic Normal speech. Epicritic sensation to light touch grossly  present bilaterally. Negative tinel sign at tarsal tunnel bilaterally.   Dermatologic Skin texture and turgor are within normal limits.  No open wounds. No skin lesions. Soft tissue mass present on dorsal aspect of foot measuring 1 cm x 1 cm.  Firm, mobile.  Appearance consistent with ganglion cyst.  Musculoskeletal: Normal joint ROM without pain or crepitus bilaterally. Rectus hindfoot.  Hallux abductovalgus deformity present Left 1st MPJ full range of motion. Left 1st TMT without gross hypermobility. Mild hypermobility present.  Lesser digital contractures present - at second digit, partially reducible left.   Radiographs: Taken and reviewed. Left Hallux abductovalgus deformity present. Metatarsal parabola normal. 1st/2nd IMA: 15.2; TSP: 6. Narrowing of 1st MTP joint present with subchondral sclerosis. Mild dorsiflexion contracture at 2nd MTP. Rectus footshape.  Assessment:   1. Neuroma   2. Hallux abducto valgus, left   3. Hammertoe of second toe of left foot   4. Mass of soft tissue of left lower extremity    Plan:  Patient was evaluated and treated and all questions answered.  Hallux abductovalgus deformity, left Hammertoe deformity, left 2nd Soft tissue mass, left foot  -XR as above. -Conservative and surgical treatment options were discussed with the patient.  -Patient would like to pursue conservative therapy. We discussed changing shoegear, adding supportive insoles, padding for the painful areas, and use of over the  counter NSAIDs as patient tolerates. The patient is to return to clinic PRN for surgical planning or for continued non-surgical management. She is interested in pursuing surgery once she is able to sell her house and move out.  -Discussed procedures: Left 1st MTP arthrodesis, 2nd HT correction, soft tissue mass excision -Risk factors: None identified. Consider vitamin D lab test pre-operatively -Target Post-Op Timeline: WBAT in CAM boot immediately post  operatively  Prentice Ovens, DPM AACFAS Fellowship Trained Podiatric Surgeon [facility]      [1]  Current Outpatient Medications:    aspirin  EC 81 MG tablet, Take 1 tablet (81 mg total) by mouth daily. Swallow whole., Disp: 30 tablet, Rfl: 12   atorvastatin  (LIPITOR) 20 MG tablet, TAKE 1 TABLET BY MOUTH EVERYDAY AT BEDTIME, Disp: 90 tablet, Rfl: 0   B Complex-C (SUPER B COMPLEX PO), Take 1 tablet by mouth daily. , Disp: , Rfl:    betamethasone 0.5% cream-vitamin a&d ointment 1:1 mixture, Apply topically daily., Disp: , Rfl:    betamethasone dipropionate (DIPROLENE) 0.05 % cream, Apply 1 application topically 2 (two) times daily as needed. , Disp: , Rfl:    Calcium -Magnesium-Vitamin D (CALCIUM  1200+D3 PO), Take 1 tablet by mouth daily. , Disp: , Rfl:    Cholecalciferol (VITAMIN D3) 50 MCG (2000 UT) capsule, Take 2,000 Units by mouth daily., Disp: , Rfl:    cycloSPORINE (RESTASIS) 0.05 % ophthalmic emulsion, 1 drop 2 (two) times daily., Disp: , Rfl:    hydrochlorothiazide  (HYDRODIURIL ) 25 MG tablet, Take 1 tablet (25 mg total) by mouth in the morning., Disp: 90 tablet, Rfl: 3   ibuprofen  (ADVIL ) 200 MG tablet, Take 200 mg by mouth every 6 (six) hours as needed for mild pain., Disp: , Rfl:    LUTEIN PO, Take by mouth., Disp: , Rfl:    Multiple Vitamins-Minerals (CENTRUM SILVER 50+WOMEN PO), Take 1 tablet by mouth daily., Disp: , Rfl:    OMEPRAZOLE PO, Take 20 mg by mouth., Disp: , Rfl:    fluticasone (FLONASE) 50 MCG/ACT nasal spray, Place into both nostrils daily. (Patient not taking: Reported on 10/13/2024), Disp: , Rfl:  [2]  Social History Tobacco Use  Smoking Status Never  Smokeless Tobacco Never  [3]  Allergies Allergen Reactions   Caine-1 [Lidocaine ] Other (See Comments)    Itching and swelling locally-had to take steriods   Letrozole  Other (See Comments)   Esomeprazole Magnesium Diarrhea   Formaldehyde Rash   Nickel Rash

## 2024-11-26 ENCOUNTER — Ambulatory Visit: Admitting: Cardiology

## 2025-01-25 ENCOUNTER — Ambulatory Visit: Admitting: Cardiology

## 2025-09-01 ENCOUNTER — Inpatient Hospital Stay: Attending: Hematology and Oncology | Admitting: Hematology and Oncology
